# Patient Record
Sex: Female | Born: 1965 | Race: White | Hispanic: No | Marital: Single | Smoking: Never smoker
Health system: Southern US, Community
[De-identification: ages and names within clinical notes are randomized; demographics above are authoritative.]

## PROBLEM LIST (undated history)

## (undated) DIAGNOSIS — K219 Gastro-esophageal reflux disease without esophagitis: Secondary | ICD-10-CM

## (undated) DIAGNOSIS — Z8719 Personal history of other diseases of the digestive system: Secondary | ICD-10-CM

## (undated) DIAGNOSIS — E785 Hyperlipidemia, unspecified: Secondary | ICD-10-CM

## (undated) DIAGNOSIS — M810 Age-related osteoporosis without current pathological fracture: Secondary | ICD-10-CM

## (undated) DIAGNOSIS — M5137 Other intervertebral disc degeneration, lumbosacral region: Secondary | ICD-10-CM

## (undated) DIAGNOSIS — E559 Vitamin D deficiency, unspecified: Secondary | ICD-10-CM

## (undated) DIAGNOSIS — D759 Disease of blood and blood-forming organs, unspecified: Secondary | ICD-10-CM

## (undated) HISTORY — DX: Gastro-esophageal reflux disease without esophagitis: K21.9

## (undated) HISTORY — DX: Vitamin D deficiency, unspecified: E55.9

## (undated) HISTORY — DX: Age-related osteoporosis without current pathological fracture: M81.0

## (undated) HISTORY — DX: Other intervertebral disc degeneration, lumbosacral region: M51.37

## (undated) HISTORY — DX: Hyperlipidemia, unspecified: E78.5

---

## 2003-06-04 ENCOUNTER — Emergency Department (HOSPITAL_COMMUNITY): Admission: EM | Admit: 2003-06-04 | Discharge: 2003-06-04 | Payer: Self-pay | Admitting: Emergency Medicine

## 2006-10-15 ENCOUNTER — Ambulatory Visit (HOSPITAL_COMMUNITY): Admission: RE | Admit: 2006-10-15 | Discharge: 2006-10-15 | Payer: Self-pay | Admitting: Family Medicine

## 2011-07-10 ENCOUNTER — Encounter (HOSPITAL_COMMUNITY): Payer: Self-pay | Admitting: Pharmacy Technician

## 2011-07-23 ENCOUNTER — Encounter (HOSPITAL_COMMUNITY)
Admission: RE | Admit: 2011-07-23 | Discharge: 2011-07-23 | Disposition: A | Discharge status: Home / Self Care | Payer: Medicare Other | Source: Ambulatory Visit | Attending: Oral Surgery | Admitting: Oral Surgery

## 2011-07-23 ENCOUNTER — Encounter (HOSPITAL_COMMUNITY): Payer: Self-pay

## 2011-07-23 HISTORY — DX: Disease of blood and blood-forming organs, unspecified: D75.9

## 2011-07-23 LAB — CBC
HCT: 37.3 % (ref 36.0–46.0)
MCV: 89.9 fL (ref 78.0–100.0)
RBC: 4.15 MIL/uL (ref 3.87–5.11)
RDW: 13.5 % (ref 11.5–15.5)
WBC: 12.3 10*3/uL — ABNORMAL HIGH (ref 4.0–10.5)

## 2011-07-23 LAB — PROTIME-INR: INR: 1 (ref 0.00–1.49)

## 2011-07-23 LAB — APTT: aPTT: 23 seconds — ABNORMAL LOW (ref 24–37)

## 2011-07-23 NOTE — H&P (Signed)
HISTORY AND PHYSICAL  Shannon Castaneda is a 46 y.o. female patient with CC: Painful teeth.  No diagnosis found.  Past Medical History  Diagnosis Date  . Blood dyscrasia     "free bleeder "    No current facility-administered medications for this encounter.   Current Outpatient Prescriptions  Medication Sig Dispense Refill  . beta carotene w/minerals (OCUVITE) tablet Take 1 tablet by mouth daily.      . Calcium Carbonate-Vitamin D (CALCIUM + D PO) Take 1 tablet by mouth daily.      Marland Kitchen ibandronate (BONIVA) 150 MG tablet Take 150 mg by mouth every 30 (thirty) days. Take in the morning with a full glass of water, on an empty stomach, and do not take anything else by mouth or lie down for the next 30 min.      Marland Kitchen PRESCRIPTION MEDICATION Take 1 tablet by mouth daily. Birth control       No Known Allergies Active Problems:  * No active hospital problems. *   Vitals: There were no vitals taken for this visit. Lab results: Results for orders placed during the hospital encounter of 07/23/11 (from the past 24 hour(s))  CBC     Status: Abnormal   Collection Time   07/23/11 12:00 PM      Component Value Range   WBC 12.3 (*) 4.0 - 10.5 (K/uL)   RBC 4.15  3.87 - 5.11 (MIL/uL)   Hemoglobin 13.1  12.0 - 15.0 (g/dL)   HCT 16.1  09.6 - 04.5 (%)   MCV 89.9  78.0 - 100.0 (fL)   MCH 31.6  26.0 - 34.0 (pg)   MCHC 35.1  30.0 - 36.0 (g/dL)   RDW 40.9  81.1 - 91.4 (%)   Platelets 245  150 - 400 (K/uL)  HCG, SERUM, QUALITATIVE     Status: Normal   Collection Time   07/23/11 12:00 PM      Component Value Range   Preg, Serum NEGATIVE  NEGATIVE   PROTIME-INR     Status: Normal   Collection Time   07/23/11 12:00 PM      Component Value Range   Prothrombin Time 13.4  11.6 - 15.2 (seconds)   INR 1.00  0.00 - 1.49   APTT     Status: Abnormal   Collection Time   07/23/11 12:00 PM      Component Value Range   aPTT 23 (*) 24 - 37 (seconds)   Radiology Results: No results found. General appearance: alert,  cooperative and no distress Head: Normocephalic, without obvious abnormality, atraumatic Eyes: conjunctivae/corneas clear. PERRL, EOM's intact. Fundi benign. Ears: normal TM's and external ear canals both ears Nose: Nares normal. Septum midline. Mucosa normal. No drainage or sinus tenderness. Throat: severe dental caris teeth #s 1, 2, 3, 5, 6, 11, 12, 14, 15, 16 Neck: no adenopathy, no carotid bruit, no JVD, supple, symmetrical, trachea midline and thyroid not enlarged, symmetric, no tenderness/mass/nodules Resp: clear to auscultation bilaterally Cardio: regular rate and rhythm, S1, S2 normal, no murmur, click, rub or gallop  Assessment:45 WF with gross dental decayteeth #s 1, 2, 3, 5, 6, 11, 12, 14, 15, 16   Plan: Extract teeth #'s  1, 2, 3, 5, 6, 11, 12, 14, 15, 16, alveoloplasty, day surgery. Insert immediate denture  Jacarie Pate M 07/23/2011

## 2011-07-23 NOTE — Pre-Procedure Instructions (Signed)
20 Shannon Castaneda  07/23/2011   Your procedure is scheduled on:    Monday  07/27/11  Report to [facility] at 530 AM.  Call this number if you have problems the morning of surgery: [phone]   Remember:   Do not eat food:After Midnight.  May have clear liquids: up to 4 Hours before arrival.  Clear liquids include soda, tea, black coffee, apple or grape juice, broth.  Take these medicines the morning of surgery with A SIP OF WATER:   Birth control    Do not wear jewelry, make-up or nail polish.  Do not wear lotions, powders, or perfumes. You may wear deodorant.  Do not shave 48 hours prior to surgery.  Do not bring valuables to the hospital.  Contacts, dentures or bridgework may not be worn into surgery.  Leave suitcase in the car. After surgery it may be brought to your room.  For patients admitted to the hospital, checkout time is 11:00 AM the day of discharge.   Patients discharged the day of surgery will not be allowed to drive home.  Name and phone number of your driver:   Special Instructions: CHG Shower Use Special Wash: 1/2 bottle night before surgery and 1/2 bottle morning of surgery.   Please read over the following fact sheets that you were given: Pain Booklet, MRSA Information and Surgical Site Infection Prevention

## 2011-07-27 ENCOUNTER — Encounter (HOSPITAL_COMMUNITY): Payer: Self-pay | Admitting: Anesthesiology

## 2011-07-27 ENCOUNTER — Ambulatory Visit (HOSPITAL_COMMUNITY): Payer: Medicare Other | Admitting: Anesthesiology

## 2011-07-27 ENCOUNTER — Encounter (HOSPITAL_COMMUNITY): Payer: Self-pay | Admitting: Surgery

## 2011-07-27 ENCOUNTER — Encounter (HOSPITAL_COMMUNITY)
Admission: RE | Disposition: A | Discharge status: Home / Self Care | Payer: Self-pay | Source: Ambulatory Visit | Attending: Oral Surgery

## 2011-07-27 ENCOUNTER — Ambulatory Visit (HOSPITAL_COMMUNITY)
Admission: RE | Admit: 2011-07-27 | Discharge: 2011-07-27 | Disposition: A | Discharge status: Home / Self Care | Payer: Medicare Other | Source: Ambulatory Visit | Attending: Oral Surgery | Admitting: Oral Surgery

## 2011-07-27 DIAGNOSIS — K089 Disorder of teeth and supporting structures, unspecified: Secondary | ICD-10-CM | POA: Insufficient documentation

## 2011-07-27 DIAGNOSIS — K05329 Chronic periodontitis, generalized, unspecified severity: Secondary | ICD-10-CM

## 2011-07-27 DIAGNOSIS — K029 Dental caries, unspecified: Secondary | ICD-10-CM | POA: Insufficient documentation

## 2011-07-27 DIAGNOSIS — Z01812 Encounter for preprocedural laboratory examination: Secondary | ICD-10-CM | POA: Insufficient documentation

## 2011-07-27 DIAGNOSIS — D759 Disease of blood and blood-forming organs, unspecified: Secondary | ICD-10-CM | POA: Insufficient documentation

## 2011-07-27 HISTORY — PX: MULTIPLE EXTRACTIONS WITH ALVEOLOPLASTY: SHX5342

## 2011-07-27 SURGERY — MULTIPLE EXTRACTION WITH ALVEOLOPLASTY
Anesthesia: General | Site: Mouth | Laterality: Bilateral | Wound class: Clean Contaminated

## 2011-07-27 MED ORDER — SODIUM CHLORIDE 0.9 % IR SOLN
Status: DC | PRN
  Administered 2011-07-27: 1000 mL

## 2011-07-27 MED ORDER — PROPOFOL 10 MG/ML IV EMUL
INTRAVENOUS | Status: DC | PRN
  Administered 2011-07-27: 150 mg via INTRAVENOUS

## 2011-07-27 MED ORDER — ONDANSETRON HCL 4 MG/2ML IJ SOLN
INTRAMUSCULAR | Status: DC | PRN
  Administered 2011-07-27: 4 mg via INTRAVENOUS

## 2011-07-27 MED ORDER — OXYCODONE-ACETAMINOPHEN 5-325 MG PO TABS: 1.0000 | ORAL_TABLET | ORAL | Status: AC | PRN

## 2011-07-27 MED ORDER — LACTATED RINGERS IV SOLN
INTRAVENOUS | Status: DC | PRN
  Administered 2011-07-27 (×2): via INTRAVENOUS

## 2011-07-27 MED ORDER — PROMETHAZINE HCL 25 MG/ML IJ SOLN: 6.2500 mg | INTRAMUSCULAR | Status: DC | PRN

## 2011-07-27 MED ORDER — LIDOCAINE-EPINEPHRINE 2 %-1:100000 IJ SOLN
INTRAMUSCULAR | Status: DC | PRN
  Administered 2011-07-27: 14 mL

## 2011-07-27 MED ORDER — MORPHINE SULFATE 2 MG/ML IJ SOLN
1.0000 mg | INTRAMUSCULAR | Status: DC | PRN
  Administered 2011-07-27 (×2): 1 mg via INTRAVENOUS

## 2011-07-27 MED ORDER — FENTANYL CITRATE 0.05 MG/ML IJ SOLN
INTRAMUSCULAR | Status: DC | PRN
  Administered 2011-07-27: 125 ug via INTRAVENOUS

## 2011-07-27 MED ORDER — MEPERIDINE HCL 25 MG/ML IJ SOLN: 6.2500 mg | INTRAMUSCULAR | Status: DC | PRN

## 2011-07-27 MED ORDER — SUCCINYLCHOLINE CHLORIDE 20 MG/ML IJ SOLN
INTRAMUSCULAR | Status: DC | PRN
  Administered 2011-07-27: 80 mg via INTRAVENOUS

## 2011-07-27 MED ORDER — OXYMETAZOLINE HCL 0.05 % NA SOLN
NASAL | Status: DC | PRN
  Administered 2011-07-27: 2 via NASAL

## 2011-07-27 MED ORDER — CEFAZOLIN SODIUM 1-5 GM-% IV SOLN
INTRAVENOUS | Status: AC
  Filled 2011-07-27: qty 50

## 2011-07-27 MED ORDER — MIDAZOLAM HCL 5 MG/5ML IJ SOLN
INTRAMUSCULAR | Status: DC | PRN
  Administered 2011-07-27: 2 mg via INTRAVENOUS

## 2011-07-27 MED ORDER — LIDOCAINE HCL (CARDIAC) 20 MG/ML IV SOLN
INTRAVENOUS | Status: DC | PRN
  Administered 2011-07-27: 70 mg via INTRAVENOUS

## 2011-07-27 MED ORDER — CEFAZOLIN SODIUM 1-5 GM-% IV SOLN
INTRAVENOUS | Status: DC | PRN
  Administered 2011-07-27: 1 g via INTRAVENOUS

## 2011-07-27 SURGICAL SUPPLY — 32 items
BLADE SURG 15 STRL LF DISP TIS (BLADE) ×1 IMPLANT
BLADE SURG 15 STRL SS (BLADE) ×1
BUR CROSS CUT FISSURE 1.6 (BURR) ×2 IMPLANT
BUR EGG ELITE 4.0 (BURR) ×2 IMPLANT
CANISTER SUCTION 2500CC (MISCELLANEOUS) ×2 IMPLANT
CLOTH BEACON ORANGE TIMEOUT ST (SAFETY) ×2 IMPLANT
COVER SURGICAL LIGHT HANDLE (MISCELLANEOUS) ×2 IMPLANT
CRADLE DONUT ADULT HEAD (MISCELLANEOUS) ×2 IMPLANT
DECANTER SPIKE VIAL GLASS SM (MISCELLANEOUS) ×2 IMPLANT
GAUZE PACKING FOLDED 2  STR (GAUZE/BANDAGES/DRESSINGS) ×1
GAUZE PACKING FOLDED 2 STR (GAUZE/BANDAGES/DRESSINGS) ×1 IMPLANT
GLOVE BIO SURGEON STRL SZ 6.5 (GLOVE) ×4 IMPLANT
GLOVE BIO SURGEON STRL SZ7 (GLOVE) IMPLANT
GLOVE BIO SURGEON STRL SZ7.5 (GLOVE) ×2 IMPLANT
GLOVE BIOGEL PI IND STRL 7.0 (GLOVE) ×2 IMPLANT
GLOVE BIOGEL PI INDICATOR 7.0 (GLOVE) ×2
GLOVE SURG SS PI 6.5 STRL IVOR (GLOVE) ×2 IMPLANT
GOWN STRL NON-REIN LRG LVL3 (GOWN DISPOSABLE) ×6 IMPLANT
GOWN STRL REIN XL XLG (GOWN DISPOSABLE) ×2 IMPLANT
KIT BASIN OR (CUSTOM PROCEDURE TRAY) ×2 IMPLANT
KIT ROOM TURNOVER OR (KITS) ×2 IMPLANT
NEEDLE 22X1 1/2 (OR ONLY) (NEEDLE) ×2 IMPLANT
NS IRRIG 1000ML POUR BTL (IV SOLUTION) ×2 IMPLANT
PAD ARMBOARD 7.5X6 YLW CONV (MISCELLANEOUS) ×4 IMPLANT
SUT CHROMIC 3 0 PS 2 (SUTURE) ×2 IMPLANT
SYR 50ML SLIP (SYRINGE) IMPLANT
TOWEL OR 17X24 6PK STRL BLUE (TOWEL DISPOSABLE) ×2 IMPLANT
TOWEL OR 17X26 10 PK STRL BLUE (TOWEL DISPOSABLE) ×2 IMPLANT
TRAY ENT MC OR (CUSTOM PROCEDURE TRAY) ×2 IMPLANT
TUBING IRRIGATION (MISCELLANEOUS) ×2 IMPLANT
WATER STERILE IRR 1000ML POUR (IV SOLUTION) IMPLANT
YANKAUER SUCT BULB TIP NO VENT (SUCTIONS) ×2 IMPLANT

## 2011-07-27 NOTE — Anesthesia Postprocedure Evaluation (Signed)
  Anesthesia Post-op Note  Patient: Shannon Castaneda  Procedure(s) Performed: Procedure(s) (LRB): MULTIPLE EXTRACION WITH ALVEOLOPLASTY (Bilateral)  Patient Location: PACU  Anesthesia Type: General  Level of Consciousness: awake  Airway and Oxygen Therapy: Patient Spontanous Breathing  Post-op Pain: mild  Post-op Assessment: Post-op Vital signs reviewed  Post-op Vital Signs: stable  Complications: No apparent anesthesia complications

## 2011-07-27 NOTE — Transfer of Care (Signed)
Immediate Anesthesia Transfer of Care Note  Patient: Shannon Castaneda  Procedure(s) Performed: Procedure(s) (LRB): MULTIPLE EXTRACION WITH ALVEOLOPLASTY (Bilateral)  Patient Location: PACU  Anesthesia Type: General  Level of Consciousness: awake, alert , oriented and patient cooperative  Airway & Oxygen Therapy: Patient Spontanous Breathing and Patient connected to nasal cannula oxygen  Post-op Assessment: Report given to PACU RN  Post vital signs: Reviewed and stable  Complications: No apparent anesthesia complications

## 2011-07-27 NOTE — Anesthesia Procedure Notes (Signed)
Procedure Name: Intubation Date/Time: 07/27/2011 8:34 AM Performed by: Rogelia Boga Pre-anesthesia Checklist: Patient identified, Suction available, Patient being monitored and Timeout performed Patient Re-evaluated:Patient Re-evaluated prior to inductionOxygen Delivery Method: Circle system utilized Preoxygenation: Pre-oxygenation with 100% oxygen Intubation Type: IV induction Ventilation: Mask ventilation without difficulty Nasal Tubes: Right, Nasal prep performed and Nasal Rae Tube size: 6.5 mm Number of attempts: 1 Airway Equipment and Method: Video-laryngoscopy Placement Confirmation: ETT inserted through vocal cords under direct vision,  positive ETCO2 and breath sounds checked- equal and bilateral Tube secured with: Tape Dental Injury: Teeth and Oropharynx as per pre-operative assessment

## 2011-07-27 NOTE — Op Note (Signed)
07/27/2011  9:12 AM  PATIENT:  Shannon Castaneda  46 y.o. female  PRE-OPERATIVE DIAGNOSIS:  NONRESTORABLE TEETH #'s 1, 2, 3, 5, 6, 11, 12, 14, 15, 16,   POST-OPERATIVE DIAGNOSIS:  SAME  PROCEDURE:  Procedure(s): MULTIPLE EXTRACTION NONRESTORABLE TEETH #'s 1, 2, 3, 5, 6, 11, 12, 14, 15, 16,  WITH ALVEOLOPLASTY, Insertion Immediate Denture  SURGEON:  Surgeon(s): Georgia Lopes, DDS  ANESTHESIA:   local and general  EBL:  minimal  DRAINS: none   LOCAL MEDICATIONS USED:  LIDOCAINE 14 CC  SPECIMEN:  No Specimen  COUNTS:  YES  PLAN OF CARE: Discharge to home after PACU  PATIENT DISPOSITION:  PACU - hemodynamically stable.   PROCEDURE DETAILS: Dictation # [id number]  Georgia Lopes, DMD 07/27/2011 9:12 AM

## 2011-07-27 NOTE — H&P (Signed)
H&P documentation  -History and Physical Reviewed  -Patient has been re-examined  -No change in the plan of care  Shannon Castaneda M  

## 2011-07-27 NOTE — Preoperative (Signed)
Beta Blockers   Reason not to administer Beta Blockers:Not Applicable 

## 2011-07-27 NOTE — Anesthesia Preprocedure Evaluation (Addendum)
Anesthesia Evaluation  Patient identified by MRN, date of birth, ID band Patient awake    Reviewed: Allergy & Precautions, H&P , NPO status   Airway Mallampati: II TM Distance: <3 FB Neck ROM: Full  Mouth opening: Limited Mouth Opening  Dental  (+) Poor Dentition and Dental Advisory Given   Pulmonary  breath sounds clear to auscultation        Cardiovascular Rhythm:Regular Rate:Normal     Neuro/Psych    GI/Hepatic   Endo/Other    Renal/GU      Musculoskeletal   Abdominal   Peds  Hematology   Anesthesia Other Findings   Reproductive/Obstetrics                          Anesthesia Physical Anesthesia Plan  ASA: II  Anesthesia Plan: General   Post-op Pain Management:    Induction: Intravenous  Airway Management Planned: Oral ETT  Additional Equipment:   Intra-op Plan:   Post-operative Plan: Extubation in OR  Informed Consent: I have reviewed the patients History and Physical, chart, labs and discussed the procedure including the risks, benefits and alternatives for the proposed anesthesia with the patient or authorized representative who has indicated his/her understanding and acceptance.   Dental advisory given  Plan Discussed with: CRNA, Surgeon and Anesthesiologist  Anesthesia Plan Comments:        Anesthesia Quick Evaluation

## 2011-07-28 NOTE — Op Note (Signed)
NAMECHASITTY, HEHL               ACCOUNT NO.:  [id number]  MEDICAL RECORD NO.:  [id number]  LOCATION:  [facility]                         FACILITY:  [facility]  PHYSICIAN:  Georgia Lopes, M.D.  DATE OF BIRTH:  1965/06/22  DATE OF PROCEDURE:  07/27/2011 DATE OF DISCHARGE:  07/27/2011                              OPERATIVE REPORT   PREOPERATIVE DIAGNOSIS:  Nonrestorable teeth numbers 1, 2, 3, 5, 6, 11, 12, 14, 15, 16.  POSTOPERATIVE DIAGNOSIS:  Nonrestorable teeth numbers 1, 2, 3, 5, 6, 11, 12, 14, 15, 16.  PROCEDURE:  Removal of teeth numbers 1, 2, 3, 5, 6, 11, 12, 14, 15, 16; alveoplasty right and left maxilla; insertion of immediate denture.  SURGEON:  Georgia Lopes, M.D.  ANESTHESIA:  General, nasal intubation.  INDICATIONS FOR PROCEDURE:  Shannon Castaneda is a 46 year old female, who is referred to my office by her general dentist for removal of multiple nonrestorable teeth.  She has a benign past medical history, but because of the number of teeth to be removed, it was recommended that the patient undergo general anesthesia for comfort and ease of surgery.  PROCEDURE:  The patient was taken to the operating room and placed on the table in supine position.  General anesthesia was administered intravenously and a nasal endotracheal tube was placed and secured.  The eyes were protected.  The patient was draped for the procedure.  Time- out was performed.  The posterior pharynx was suctioned.  A throat pack was placed.  A 2% lidocaine with 1:100,000 epinephrine was infiltrated in the right and left maxilla around the teeth to be removed, a total of 14 mL was utilized.  The left side was operated first.  A #15 blade was used to make a full-thickness incision buccally and palatally around teeth numbers 11, 12, 14, 15, 16.  The periosteum was reflected. Interproximal bone was removed with the Stryker handpiece under irrigation.  The teeth were elevated with a #301 elevator and removed from  the mouth with the upper universal forceps.  The sockets were then curetted and alveoplasty was performed with the egg-shaped bur and then the area was irrigated and closed with 3-0 chromic.  The right side was operated next.  A #15 blade was used to make a full-thickness incision around teeth numbers 1, 2, 3, 5, 6.  The periosteum was reflected with a periosteal elevator.  Interproximal bone was removed with a Stryker handpiece and then the teeth were elevated with a #301 elevator and removed from the mouth with a #150 forceps.  Sockets were then curetted and irrigated.  Alveoplasty was performed with the egg-shaped bur and then closure was performed with 3-0 chromic.  Then the upper previously fabricated clear acrylic denture was checked into the mouth and found to have good fit and was aligned with the soft denture lining material and placed in the mouth and allowed to set.  Then the denture material was trimmed and the surgery was concluded.  The oral cavity was irrigated, suctioned.  Throat pack was removed.  The patient was taken to the recovery room, breathing spontaneously, in good condition.  ESTIMATED BLOOD LOSS:  Minimum.  COMPLICATIONS:  None.  SPECIMENS:  None.     Georgia Lopes, M.D.     SMJ/MEDQ  D:  07/27/2011  T:  07/28/2011  Job:  [id number]

## 2011-08-03 ENCOUNTER — Encounter (HOSPITAL_COMMUNITY): Payer: Self-pay | Admitting: Oral Surgery

## 2012-09-13 ENCOUNTER — Other Ambulatory Visit (HOSPITAL_COMMUNITY): Payer: Self-pay | Admitting: Family Medicine

## 2012-09-13 ENCOUNTER — Ambulatory Visit (HOSPITAL_COMMUNITY)
Admission: RE | Admit: 2012-09-13 | Discharge: 2012-09-13 | Disposition: A | Discharge status: Home / Self Care | Payer: Medicare Other | Source: Ambulatory Visit | Attending: Family Medicine | Admitting: Family Medicine

## 2012-09-13 DIAGNOSIS — M47817 Spondylosis without myelopathy or radiculopathy, lumbosacral region: Secondary | ICD-10-CM | POA: Insufficient documentation

## 2012-09-13 DIAGNOSIS — M810 Age-related osteoporosis without current pathological fracture: Secondary | ICD-10-CM

## 2012-09-13 DIAGNOSIS — M47814 Spondylosis without myelopathy or radiculopathy, thoracic region: Secondary | ICD-10-CM | POA: Insufficient documentation

## 2012-09-13 DIAGNOSIS — IMO0002 Reserved for concepts with insufficient information to code with codable children: Secondary | ICD-10-CM

## 2012-09-13 DIAGNOSIS — M549 Dorsalgia, unspecified: Secondary | ICD-10-CM | POA: Insufficient documentation

## 2012-09-19 ENCOUNTER — Other Ambulatory Visit (HOSPITAL_COMMUNITY): Payer: Medicare Other

## 2015-03-18 ENCOUNTER — Ambulatory Visit (HOSPITAL_COMMUNITY)
Admission: RE | Admit: 2015-03-18 | Discharge: 2015-03-18 | Disposition: A | Discharge status: Home / Self Care | Payer: Medicare Other | Source: Ambulatory Visit | Attending: Family Medicine | Admitting: Family Medicine

## 2015-03-18 ENCOUNTER — Other Ambulatory Visit (HOSPITAL_COMMUNITY): Payer: Self-pay | Admitting: Family Medicine

## 2015-03-18 DIAGNOSIS — M5136 Other intervertebral disc degeneration, lumbar region: Secondary | ICD-10-CM | POA: Diagnosis not present

## 2015-03-18 DIAGNOSIS — M5442 Lumbago with sciatica, left side: Secondary | ICD-10-CM

## 2015-03-18 DIAGNOSIS — M545 Low back pain: Secondary | ICD-10-CM | POA: Diagnosis present

## 2015-03-18 DIAGNOSIS — M5137 Other intervertebral disc degeneration, lumbosacral region: Secondary | ICD-10-CM | POA: Diagnosis not present

## 2015-04-01 ENCOUNTER — Other Ambulatory Visit (HOSPITAL_COMMUNITY): Payer: Medicare Other

## 2015-12-09 ENCOUNTER — Other Ambulatory Visit (HOSPITAL_COMMUNITY): Payer: Self-pay | Admitting: Family Medicine

## 2015-12-09 DIAGNOSIS — R29898 Other symptoms and signs involving the musculoskeletal system: Secondary | ICD-10-CM

## 2015-12-09 DIAGNOSIS — M79605 Pain in left leg: Principal | ICD-10-CM

## 2015-12-09 DIAGNOSIS — M545 Low back pain: Secondary | ICD-10-CM

## 2015-12-19 ENCOUNTER — Ambulatory Visit (HOSPITAL_COMMUNITY)
Admission: RE | Admit: 2015-12-19 | Discharge: 2015-12-19 | Disposition: A | Discharge status: Home / Self Care | Payer: Medicare Other | Source: Ambulatory Visit | Attending: Family Medicine | Admitting: Family Medicine

## 2015-12-19 DIAGNOSIS — M5136 Other intervertebral disc degeneration, lumbar region: Secondary | ICD-10-CM | POA: Diagnosis not present

## 2015-12-19 DIAGNOSIS — M545 Low back pain, unspecified: Secondary | ICD-10-CM

## 2015-12-19 DIAGNOSIS — R531 Weakness: Secondary | ICD-10-CM | POA: Insufficient documentation

## 2015-12-19 DIAGNOSIS — R29898 Other symptoms and signs involving the musculoskeletal system: Secondary | ICD-10-CM

## 2015-12-19 DIAGNOSIS — M79605 Pain in left leg: Secondary | ICD-10-CM

## 2015-12-19 DIAGNOSIS — M5127 Other intervertebral disc displacement, lumbosacral region: Secondary | ICD-10-CM | POA: Insufficient documentation

## 2016-03-06 ENCOUNTER — Telehealth: Payer: Self-pay | Admitting: Family Medicine

## 2016-03-06 NOTE — Telephone Encounter (Signed)
Left message for pt to return call to sch appt with dr Delton Seenelson.

## 2016-03-17 ENCOUNTER — Other Ambulatory Visit: Payer: Self-pay | Admitting: Family Medicine

## 2016-03-17 ENCOUNTER — Encounter (INDEPENDENT_AMBULATORY_CARE_PROVIDER_SITE_OTHER): Payer: Self-pay

## 2016-03-17 ENCOUNTER — Ambulatory Visit (INDEPENDENT_AMBULATORY_CARE_PROVIDER_SITE_OTHER): Payer: Medicare Other | Admitting: Family Medicine

## 2016-03-17 ENCOUNTER — Encounter: Payer: Self-pay | Admitting: Family Medicine

## 2016-03-17 VITALS — BP 116/72 | HR 72 | Temp 97.8°F | Resp 18 | Ht 65.25 in | Wt 110.0 lb

## 2016-03-17 DIAGNOSIS — M5137 Other intervertebral disc degeneration, lumbosacral region: Secondary | ICD-10-CM | POA: Diagnosis not present

## 2016-03-17 DIAGNOSIS — E785 Hyperlipidemia, unspecified: Secondary | ICD-10-CM | POA: Insufficient documentation

## 2016-03-17 DIAGNOSIS — H50012 Monocular esotropia, left eye: Secondary | ICD-10-CM

## 2016-03-17 DIAGNOSIS — F79 Unspecified intellectual disabilities: Secondary | ICD-10-CM

## 2016-03-17 DIAGNOSIS — M549 Dorsalgia, unspecified: Secondary | ICD-10-CM

## 2016-03-17 DIAGNOSIS — Z7689 Persons encountering health services in other specified circumstances: Secondary | ICD-10-CM | POA: Diagnosis not present

## 2016-03-17 DIAGNOSIS — M545 Low back pain: Secondary | ICD-10-CM

## 2016-03-17 DIAGNOSIS — M810 Age-related osteoporosis without current pathological fracture: Secondary | ICD-10-CM

## 2016-03-17 DIAGNOSIS — H5000 Unspecified esotropia: Secondary | ICD-10-CM

## 2016-03-17 DIAGNOSIS — M8580 Other specified disorders of bone density and structure, unspecified site: Secondary | ICD-10-CM | POA: Insufficient documentation

## 2016-03-17 DIAGNOSIS — G8929 Other chronic pain: Secondary | ICD-10-CM

## 2016-03-17 DIAGNOSIS — M51379 Other intervertebral disc degeneration, lumbosacral region without mention of lumbar back pain or lower extremity pain: Secondary | ICD-10-CM | POA: Insufficient documentation

## 2016-03-17 HISTORY — DX: Other intervertebral disc degeneration, lumbosacral region without mention of lumbar back pain or lower extremity pain: M51.379

## 2016-03-17 HISTORY — DX: Other intervertebral disc degeneration, lumbosacral region: M51.37

## 2016-03-17 MED ORDER — MELOXICAM 15 MG PO TABS: 15.0000 mg | ORAL_TABLET | Freq: Every day | ORAL | 0 refills | Status: DC

## 2016-03-17 NOTE — Patient Instructions (Signed)
Need old records  Need to come back for a complete physical next time  I am starting meloxicam for your back Take once a day with food Let me know if this upsets your stomach

## 2016-03-17 NOTE — Progress Notes (Signed)
Chief Complaint  Patient presents with  . Establish Care   50 year old woman here as a new patient to establish with the office. She described herself as a "slow learner" states she is disabled for mental insufficiency. She is here by herself. She is a poor historian. She's has no acute problems today. She does have chronic recurring low back pain. An MRI of her low back showed multilevel degenerative disc disease. She is on Fosamax once a week for her bones. She does not think she has osteoporosis. She tells me she has "scoliosis". This is not supported by her x-rays. She takes pravastatin for high cholesterol. She is not on a cholesterol diet. She does take calcium and vitamin D supplementation for her bones. She hasn't had a Pap smear since she had her child (17 years). She doesn't number she's ever had a mammogram. She certain she's never had a colonoscopy. She got a flu shot at the drugstore. Doesn't know her last Td.    Patient Active Problem List   Diagnosis Date Noted  . Degeneration of lumbar or lumbosacral intervertebral disc 03/17/2016  . Chronic back pain 03/17/2016  . Mental impairment 03/17/2016  . Osteoporosis 03/17/2016  . HLD (hyperlipidemia) 03/17/2016  . Esotropia, left eye 03/17/2016    Outpatient Encounter Prescriptions as of 03/17/2016  Medication Sig  . alendronate (FOSAMAX) 35 MG tablet Take 35 mg by mouth every 7 (seven) days. Take with a full glass of water on an empty stomach.  . Calcium Carbonate-Vitamin D (CALCIUM + D PO) Take 1 tablet by mouth daily.  . pravastatin (PRAVACHOL) 40 MG tablet Take 40 mg by mouth daily.  . beta carotene w/minerals (OCUVITE) tablet Take 1 tablet by mouth daily.  . meloxicam (MOBIC) 15 MG tablet Take 1 tablet (15 mg total) by mouth daily.   No facility-administered encounter medications on file as of 03/17/2016.     Past Medical History:  Diagnosis Date  . Blood dyscrasia    "free bleeder "  . Degeneration of lumbar  or lumbosacral intervertebral disc 03/17/2016  . Hyperlipidemia   . Osteoporosis     Past Surgical History:  Procedure Laterality Date  . MULTIPLE EXTRACTIONS WITH ALVEOLOPLASTY  07/27/2011   Procedure: MULTIPLE EXTRACION WITH ALVEOLOPLASTY;  Surgeon: Georgia LopesScott M Jensen, DDS;  Location: [facility] OR;  Service: Oral Surgery;  Laterality: Bilateral;  Insertion of denture.    Social History   Social History  . Marital status: Single    Spouse name: N/A  . Number of children: 1  . Years of education: 812   Occupational History  .      disability   Social History Main Topics  . Smoking status: Never Smoker  . Smokeless tobacco: Never Used  . Alcohol use No  . Drug use: No  . Sexual activity: Yes    Birth control/ protection: Pill   Other Topics Concern  . Not on file   Social History Narrative   Lives with daughter age 52, granddaughter   Disabled due to "slow learner"    Family History  Problem Relation Age of Onset  . Heart disease Mother   . Cancer Brother     lung    Review of Systems  Constitutional: Negative for chills, fever and weight loss.  HENT: Negative for congestion and hearing loss.   Eyes: Negative for blurred vision and pain.  Respiratory: Negative for cough and shortness of breath.   Cardiovascular: Negative for chest pain and leg swelling.  Gastrointestinal: Negative for abdominal pain, constipation, diarrhea and heartburn.  Genitourinary: Negative for dysuria and frequency.  Musculoskeletal: Positive for back pain. Negative for falls, joint pain and myalgias.  Neurological: Negative for dizziness, seizures and headaches.  Psychiatric/Behavioral: Negative for depression. The patient is not nervous/anxious and does not have insomnia.     BP 116/72 (BP Location: Right Arm, Patient Position: Sitting, Cuff Size: Normal)   Pulse 72   Temp 97.8 F (36.6 C) (Oral)   Resp 18   Ht 5' 5.25" (1.657 m)   Wt 110 lb 0.6 oz (49.9 kg)   LMP 10/27/2010   SpO2 100%    BMI 18.17 kg/m   Physical Exam  Constitutional: She is oriented to person, place, and time. She appears well-developed and well-nourished.  Thin in appearance  HENT:  Head: Normocephalic and atraumatic.  Right Ear: External ear normal.  Left Ear: External ear normal.  Mouth/Throat: Oropharynx is clear and moist.  Eyes: Conjunctivae are normal. Pupils are equal, round, and reactive to light.   esotropia left  Neck: Normal range of motion. Neck supple. No thyromegaly present.  Cardiovascular: Normal rate, regular rhythm and normal heart sounds.   Pulmonary/Chest: Effort normal and breath sounds normal. No respiratory distress.  Abdominal: Soft. Bowel sounds are normal.  Musculoskeletal: Normal range of motion. She exhibits no edema.  Lymphadenopathy:    She has no cervical adenopathy.  Neurological: She is alert and oriented to person, place, and time.  Gait normal  Skin: Skin is warm and dry.  Psychiatric: She has a normal mood and affect. Her behavior is normal. Thought content normal.  Poor fund of knowledge.  Nursing note and vitals reviewed.  ASSESSMENT/PLAN:  1. Degeneration of lumbar or lumbosacral intervertebral disc Documented with MRI  2. Chronic bilateral low back pain, with sciatica presence unspecified Prescribed Mobic one a day. Walking and stretching recommended  3. Mental impairment By patient history  4. Osteoporosis without current pathological fracture, unspecified osteoporosis type By history, we'll repeat DEXA scan  5. Encounter to establish care with new doctor    6. Hyperlipidemia, unspecified hyperlipidemia type By history, on pravastatin  7. Esotropia, left eye     Patient Instructions  Need old records  Need to come back for a complete physical next time  I am starting meloxicam for your back Take once a day with food Let me know if this upsets your stomach     Eustace MooreYvonne Sue Nelson, MD

## 2016-04-13 ENCOUNTER — Other Ambulatory Visit: Payer: Self-pay | Admitting: Family Medicine

## 2016-04-14 ENCOUNTER — Encounter: Payer: Medicare Other | Admitting: Family Medicine

## 2016-04-15 ENCOUNTER — Other Ambulatory Visit: Payer: Self-pay | Admitting: Family Medicine

## 2016-04-21 ENCOUNTER — Other Ambulatory Visit: Payer: Self-pay | Admitting: Family Medicine

## 2016-04-21 ENCOUNTER — Encounter: Payer: Medicare Other | Admitting: Family Medicine

## 2016-04-21 MED ORDER — MELOXICAM 15 MG PO TABS: 15.0000 mg | ORAL_TABLET | Freq: Every day | ORAL | 2 refills | Status: DC

## 2016-04-27 ENCOUNTER — Encounter: Payer: Self-pay | Admitting: Family Medicine

## 2016-04-27 ENCOUNTER — Encounter: Payer: Medicare Other | Admitting: Family Medicine

## 2016-04-28 ENCOUNTER — Encounter: Payer: Medicare Other | Admitting: Family Medicine

## 2016-05-14 ENCOUNTER — Encounter: Payer: Medicare Other | Admitting: Family Medicine

## 2016-05-19 ENCOUNTER — Ambulatory Visit (INDEPENDENT_AMBULATORY_CARE_PROVIDER_SITE_OTHER): Payer: Medicare Other | Admitting: Family Medicine

## 2016-05-19 ENCOUNTER — Encounter: Payer: Self-pay | Admitting: Family Medicine

## 2016-05-19 VITALS — BP 124/80 | HR 70 | Temp 97.9°F | Resp 18 | Ht 65.25 in | Wt 116.1 lb

## 2016-05-19 DIAGNOSIS — Z1211 Encounter for screening for malignant neoplasm of colon: Secondary | ICD-10-CM | POA: Diagnosis not present

## 2016-05-19 DIAGNOSIS — Z0000 Encounter for general adult medical examination without abnormal findings: Secondary | ICD-10-CM | POA: Diagnosis not present

## 2016-05-19 DIAGNOSIS — E785 Hyperlipidemia, unspecified: Secondary | ICD-10-CM | POA: Diagnosis not present

## 2016-05-19 DIAGNOSIS — M810 Age-related osteoporosis without current pathological fracture: Secondary | ICD-10-CM | POA: Diagnosis not present

## 2016-05-19 DIAGNOSIS — Z1231 Encounter for screening mammogram for malignant neoplasm of breast: Secondary | ICD-10-CM | POA: Diagnosis not present

## 2016-05-19 NOTE — Progress Notes (Signed)
Chief Complaint  Patient presents with  . Annual Exam   Here for annual exam Today declines a PAP/pelvic.  Says she "might" have had this done 2 y ago  Agrees to mammogram and dexa scan Refuses colonoscopy.  Discussed and agrees to cologuard if insurance agrees and a colo IF the cologuard is positive Will get screening labs done today Will check old records for a TdaP    Patient Active Problem List   Diagnosis Date Noted  . Degeneration of lumbar or lumbosacral intervertebral disc 03/17/2016  . Chronic back pain 03/17/2016  . Mental impairment 03/17/2016  . Osteoporosis 03/17/2016  . HLD (hyperlipidemia) 03/17/2016  . Esotropia, left eye 03/17/2016    Outpatient Encounter Prescriptions as of 05/19/2016  Medication Sig  . alendronate (FOSAMAX) 35 MG tablet Take 35 mg by mouth every 7 (seven) days. Take with a full glass of water on an empty stomach.  . beta carotene w/minerals (OCUVITE) tablet Take 1 tablet by mouth daily.  . Calcium Carbonate-Vitamin D (CALCIUM + D PO) Take 1 tablet by mouth daily.  . meloxicam (MOBIC) 15 MG tablet Take 1 tablet (15 mg total) by mouth daily.  . pravastatin (PRAVACHOL) 40 MG tablet Take 40 mg by mouth daily.   No facility-administered encounter medications on file as of 05/19/2016.     Past Medical History:  Diagnosis Date  . Blood dyscrasia    "free bleeder "  . Degeneration of lumbar or lumbosacral intervertebral disc 03/17/2016  . Hyperlipidemia   . Osteoporosis     Past Surgical History:  Procedure Laterality Date  . MULTIPLE EXTRACTIONS WITH ALVEOLOPLASTY  07/27/2011   Procedure: MULTIPLE EXTRACION WITH ALVEOLOPLASTY;  Surgeon: Georgia Lopes, DDS;  Location: [facility] OR;  Service: Oral Surgery;  Laterality: Bilateral;  Insertion of denture.    Social History   Social History  . Marital status: Single    Spouse name: N/A  . Number of children: 1  . Years of education: 11   Occupational History  .      disability   Social  History Main Topics  . Smoking status: Never Smoker  . Smokeless tobacco: Never Used  . Alcohol use No  . Drug use: No  . Sexual activity: Yes    Birth control/ protection: Pill   Other Topics Concern  . Not on file   Social History Narrative   Lives with daughter age 26, granddaughter   Disabled due to "slow learner"    Family History  Problem Relation Age of Onset  . Heart disease Mother   . Cancer Brother     lung    Review of Systems  Constitutional: Negative for chills, fever and weight loss.  HENT: Negative for congestion and hearing loss.   Eyes: Negative for blurred vision and pain.  Respiratory: Negative for cough and shortness of breath.   Cardiovascular: Negative for chest pain and leg swelling.  Gastrointestinal: Negative for abdominal pain, constipation, diarrhea and heartburn.  Genitourinary: Negative for dysuria and frequency.  Musculoskeletal: Negative for falls, joint pain and myalgias.  Neurological: Negative for dizziness, seizures and headaches.  Psychiatric/Behavioral: Negative for depression. The patient is not nervous/anxious and does not have insomnia.     BP 124/80 (BP Location: Right Arm, Patient Position: Sitting, Cuff Size: Normal)   Pulse 70   Temp 97.9 F (36.6 C) (Oral)   Resp 18   Ht 5' 5.25" (1.657 m)   Wt 116 lb 1.9 oz (52.7 kg)  LMP 10/27/2010   SpO2 99%   BMI 19.18 kg/m   Physical Exam BP 124/80 (BP Location: Right Arm, Patient Position: Sitting, Cuff Size: Normal)   Pulse 70   Temp 97.9 F (36.6 C) (Oral)   Resp 18   Ht 5' 5.25" (1.657 m)   Wt 116 lb 1.9 oz (52.7 kg)   LMP 10/27/2010   SpO2 99%   BMI 19.18 kg/m   General Appearance:    Alert, cooperative, no distress, appears stated age  Head:    Normocephalic, without obvious abnormality, atraumatic  Eyes:    PERRL, conjunctiva/corneas clear, EOM's intact, fundi    benign, both eyes  Ears:    Normal TM's and external ear canals, both ears  Nose:   Nares normal,  septum midline, mucosa normal, no drainage    or sinus tenderness  Throat:   Lips, mucosa, and tongue normal; NO teeth , gums normal  Neck:   Supple, symmetrical, trachea midline, no adenopathy;    thyroid:  no enlargement/tenderness/nodules; no carotid   bruit   Back:     Symmetric, no curvature, ROM normal, no CVA tenderness. Mild kyphosis  Lungs:     Clear to auscultation bilaterally, respirations unlabored  Chest Wall:    No tenderness or deformity   Heart:    Regular rate and rhythm, S1 and S2 normal, no murmur, rub   or gallop  Breast Exam:    No tenderness, masses, or nipple abnormality  Abdomen:     Soft, non-tender, bowel sounds active all four quadrants,    no masses, no organomegaly  Genitalia:    declined  Extremities:   Extremities normal, atraumatic, no cyanosis or edema  Pulses:   2+ and symmetric all extremities  Skin:   Skin color, texture, turgor normal, no rashes or lesions.  Hypopigmented spots with fine scale - ?tinea versicolor  Lymph nodes:   Cervical, supraclavicular, and axillary nodes normal  Neurologic:   Normal strength, sensation and reflexes    throughout    1. Annual physical exam   2. Screening for colon cancer  - Cologuard     Patient Instructions  Need mammogram- ordered Need bone density- ordered Need blood tests - get these today I have ordered a cologuard This is a test for colon cancer.  Follow the instructions in the package and I will notify you of your test results  See me in 3 monrths   Health Maintenance for Postmenopausal Women Introduction Menopause is a normal process in which your reproductive ability comes to an end. This process happens gradually over a span of months to years, usually between the ages of 60 and 103. Menopause is complete when you have missed 12 consecutive menstrual periods. It is important to talk with your health care provider about some of the most common conditions that affect postmenopausal women,  such as heart disease, cancer, and bone loss (osteoporosis). Adopting a healthy lifestyle and getting preventive care can help to promote your health and wellness. Those actions can also lower your chances of developing some of these common conditions. What should I know about menopause? During menopause, you may experience a number of symptoms, such as:  Moderate-to-severe hot flashes.  Night sweats.  Decrease in sex drive.  Mood swings.  Headaches.  Tiredness.  Irritability.  Memory problems.  Insomnia. Choosing to treat or not to treat menopausal changes is an individual decision that you make with your health care provider.  What should I know  about heart disease and stroke?  Heart disease, heart attack, and stroke become more likely as you age. This may be due, in part, to the hormonal changes that your body experiences during menopause. These can affect how your body processes dietary fats, triglycerides, and cholesterol. Heart attack and stroke are both medical emergencies. There are many things that you can do to help prevent heart disease and stroke:  Have your blood pressure checked at least every 1-2 years. High blood pressure causes heart disease and increases the risk of stroke.  If you are 43-40 years old, ask your health care provider if you should take aspirin to prevent a heart attack or a stroke.  Do not use any tobacco products, including cigarettes, chewing tobacco, or electronic cigarettes. If you need help quitting, ask your health care provider.  It is important to eat a healthy diet and maintain a healthy weight.  Be sure to include plenty of vegetables, fruits, low-fat dairy products, and lean protein.  Avoid eating foods that are high in solid fats, added sugars, or salt (sodium).  Get regular exercise. This is one of the most important things that you can do for your health.  Try to exercise for at least 150 minutes each week. The type of exercise  that you do should increase your heart rate and make you sweat. This is known as moderate-intensity exercise.  Try to do strengthening exercises at least twice each week. Do these in addition to the moderate-intensity exercise.  Know your numbers.Ask your health care provider to check your cholesterol and your blood glucose. Continue to have your blood tested as directed by your health care provider. What should I know about cancer screening? There are several types of cancer. Take the following steps to reduce your risk and to catch any cancer development as early as possible. Breast Cancer  Practice breast self-awareness.  This means understanding how your breasts normally appear and feel.  It also means doing regular breast self-exams. Let your health care provider know about any changes, no matter how small.  If you are 40 or older, have a clinician do a breast exam (clinical breast exam or CBE) every year. Depending on your age, family history, and medical history, it may be recommended that you also have a yearly breast X-ray (mammogram).  If you have a family history of breast cancer, talk with your health care provider about genetic screening.  If you are at high risk for breast cancer, talk with your health care provider about having an MRI and a mammogram every year.   Cervical, Uterine, and Ovarian Cancer  Your health care provider may recommend that you be screened regularly for cancer of the pelvic organs. These include your ovaries, uterus, and vagina. This screening involves a pelvic exam, which includes checking for microscopic changes to the surface of your cervix (Pap test).  For women ages 21-65, health care providers may recommend a pelvic exam and a Pap test every three years. For women ages 55-65, they may recommend the Pap test and pelvic exam, combined with testing for human papilloma virus (HPV), every five years. Some types of HPV increase your risk of cervical  cancer. Testing for HPV may also be done on women of any age who have unclear Pap test results.  Other health care providers may not recommend any screening for nonpregnant women who are considered low risk for pelvic cancer and have no symptoms. Ask your health care provider if a screening  pelvic exam is right for you.  If you have had past treatment for cervical cancer or a condition that could lead to cancer, you need Pap tests and screening for cancer for at least 20 years after your treatment. If Pap tests have been discontinued for you, your risk factors (such as having a new sexual partner) need to be reassessed to determine if you should start having screenings again. Some women have medical problems that increase the chance of getting cervical cancer. In these cases, your health care provider may recommend that you have screening and Pap tests more often.  If you have a family history of uterine cancer or ovarian cancer, talk with your health care provider about genetic screening.  If you have vaginal bleeding after reaching menopause, tell your health care provider.  There are currently no reliable tests available to screen for ovarian cancer. Lung Cancer  Lung cancer screening is recommended for adults 50-49 years old who are at high risk for lung cancer because of a history of smoking. A yearly low-dose CT scan of the lungs is recommended if you:  Currently smoke.  Have a history of at least 30 pack-years of smoking and you currently smoke or have quit within the past 15 years. A pack-year is smoking an average of one pack of cigarettes per day for one year.  Colorectal Cancer  This type of cancer can be detected and can often be prevented.  Routine colorectal cancer screening usually begins at age 74 and continues through age 53.  If you have risk factors for colon cancer, your health care provider may recommend that you be screened at an earlier age.  If you have a family  history of colorectal cancer, talk with your health care provider about genetic screening.  Your health care provider may also recommend using home test kits to check for hidden blood in your stool.  A small camera at the end of a tube can be used to examine your colon directly (sigmoidoscopy or colonoscopy). This is done to check for the earliest forms of colorectal cancer.  Direct examination of the colon should be repeated every 5-10 years until age 87. However, if early forms of precancerous polyps or small growths are found or if you have a family history or genetic risk for colorectal cancer, you may need to be screened more often. Skin Cancer  Check your skin from head to toe regularly.  Monitor any moles. Be sure to tell your health care provider:  About any new moles or changes in moles, especially if there is a change in a mole's shape or color.  If you have a mole that is larger than the size of a pencil eraser.  If any of your family members has a history of skin cancer, especially at a young age, talk with your health care provider about genetic screening.  Always use sunscreen. Apply sunscreen liberally and repeatedly throughout the day.  Whenever you are outside, protect yourself by wearing long sleeves, pants, a wide-brimmed hat, and sunglasses. What should I know about osteoporosis? Osteoporosis is a condition in which bone destruction happens more quickly than new bone creation. After menopause, you may be at an increased risk for osteoporosis. To help prevent osteoporosis or the bone fractures that can happen because of osteoporosis, the following is recommended:  If you are 23-44 years old, get at least 1,000 mg of calcium and at least 600 mg of vitamin D per day.  If you  are older than age 53 but younger than age 53, get at least 1,200 mg of calcium and at least 600 mg of vitamin D per day.  If you are older than age 53, get at least 1,200 mg of calcium and at least  800 mg of vitamin D per day. Smoking and excessive alcohol intake increase the risk of osteoporosis. Eat foods that are rich in calcium and vitamin D, and do weight-bearing exercises several times each week as directed by your health care provider. What should I know about how menopause affects my mental health? Depression may occur at any age, but it is more common as you become older. Common symptoms of depression include:  Low or sad mood.  Changes in sleep patterns.  Changes in appetite or eating patterns.  Feeling an overall lack of motivation or enjoyment of activities that you previously enjoyed.  Frequent crying spells. Talk with your health care provider if you think that you are experiencing depression. What should I know about immunizations? It is important that you get and maintain your immunizations. These include:  Tetanus, diphtheria, and pertussis (Tdap) booster vaccine.  Influenza every year before the flu season begins.  Your health care provider may also recommend other immunizations. This information is not intended to replace advice given to you by your health care provider. Make sure you discuss any questions you have with your health care provider. Document Released: 06/26/2005 Document Revised: 11/22/2015 Document Reviewed: 02/05/2015  2017 [employer]    Eustace MooreYvonne Sue Knoah Nedeau, MD

## 2016-05-19 NOTE — Patient Instructions (Addendum)
Need mammogram- ordered Need bone density- ordered Need blood tests - get these today I have ordered a cologuard This is a test for colon cancer.  Follow the instructions in the package and I will notify you of your test results  See me in 3 monrths   Health Maintenance for Postmenopausal Women Introduction Menopause is a normal process in which your reproductive ability comes to an end. This process happens gradually over a span of months to years, usually between the ages of 6048 and 4755. Menopause is complete when you have missed 12 consecutive menstrual periods. It is important to talk with your health care provider about some of the most common conditions that affect postmenopausal women, such as heart disease, cancer, and bone loss (osteoporosis). Adopting a healthy lifestyle and getting preventive care can help to promote your health and wellness. Those actions can also lower your chances of developing some of these common conditions. What should I know about menopause? During menopause, you may experience a number of symptoms, such as:  Moderate-to-severe hot flashes.  Night sweats.  Decrease in sex drive.  Mood swings.  Headaches.  Tiredness.  Irritability.  Memory problems.  Insomnia. Choosing to treat or not to treat menopausal changes is an individual decision that you make with your health care provider.  What should I know about heart disease and stroke?  Heart disease, heart attack, and stroke become more likely as you age. This may be due, in part, to the hormonal changes that your body experiences during menopause. These can affect how your body processes dietary fats, triglycerides, and cholesterol. Heart attack and stroke are both medical emergencies. There are many things that you can do to help prevent heart disease and stroke:  Have your blood pressure checked at least every 1-2 years. High blood pressure causes heart disease and increases the risk of  stroke.  If you are 1355-53 years old, ask your health care provider if you should take aspirin to prevent a heart attack or a stroke.  Do not use any tobacco products, including cigarettes, chewing tobacco, or electronic cigarettes. If you need help quitting, ask your health care provider.  It is important to eat a healthy diet and maintain a healthy weight.  Be sure to include plenty of vegetables, fruits, low-fat dairy products, and lean protein.  Avoid eating foods that are high in solid fats, added sugars, or salt (sodium).  Get regular exercise. This is one of the most important things that you can do for your health.  Try to exercise for at least 150 minutes each week. The type of exercise that you do should increase your heart rate and make you sweat. This is known as moderate-intensity exercise.  Try to do strengthening exercises at least twice each week. Do these in addition to the moderate-intensity exercise.  Know your numbers.Ask your health care provider to check your cholesterol and your blood glucose. Continue to have your blood tested as directed by your health care provider. What should I know about cancer screening? There are several types of cancer. Take the following steps to reduce your risk and to catch any cancer development as early as possible. Breast Cancer  Practice breast self-awareness.  This means understanding how your breasts normally appear and feel.  It also means doing regular breast self-exams. Let your health care provider know about any changes, no matter how small.  If you are 40 or older, have a clinician do a breast exam (clinical breast  exam or CBE) every year. Depending on your age, family history, and medical history, it may be recommended that you also have a yearly breast X-ray (mammogram).  If you have a family history of breast cancer, talk with your health care provider about genetic screening.  If you are at high risk for breast  cancer, talk with your health care provider about having an MRI and a mammogram every year.   Cervical, Uterine, and Ovarian Cancer  Your health care provider may recommend that you be screened regularly for cancer of the pelvic organs. These include your ovaries, uterus, and vagina. This screening involves a pelvic exam, which includes checking for microscopic changes to the surface of your cervix (Pap test).  For women ages 21-65, health care providers may recommend a pelvic exam and a Pap test every three years. For women ages 102-65, they may recommend the Pap test and pelvic exam, combined with testing for human papilloma virus (HPV), every five years. Some types of HPV increase your risk of cervical cancer. Testing for HPV may also be done on women of any age who have unclear Pap test results.  Other health care providers may not recommend any screening for nonpregnant women who are considered low risk for pelvic cancer and have no symptoms. Ask your health care provider if a screening pelvic exam is right for you.  If you have had past treatment for cervical cancer or a condition that could lead to cancer, you need Pap tests and screening for cancer for at least 20 years after your treatment. If Pap tests have been discontinued for you, your risk factors (such as having a new sexual partner) need to be reassessed to determine if you should start having screenings again. Some women have medical problems that increase the chance of getting cervical cancer. In these cases, your health care provider may recommend that you have screening and Pap tests more often.  If you have a family history of uterine cancer or ovarian cancer, talk with your health care provider about genetic screening.  If you have vaginal bleeding after reaching menopause, tell your health care provider.  There are currently no reliable tests available to screen for ovarian cancer. Lung Cancer  Lung cancer screening is  recommended for adults 24-31 years old who are at high risk for lung cancer because of a history of smoking. A yearly low-dose CT scan of the lungs is recommended if you:  Currently smoke.  Have a history of at least 30 pack-years of smoking and you currently smoke or have quit within the past 15 years. A pack-year is smoking an average of one pack of cigarettes per day for one year.  Colorectal Cancer  This type of cancer can be detected and can often be prevented.  Routine colorectal cancer screening usually begins at age 108 and continues through age 39.  If you have risk factors for colon cancer, your health care provider may recommend that you be screened at an earlier age.  If you have a family history of colorectal cancer, talk with your health care provider about genetic screening.  Your health care provider may also recommend using home test kits to check for hidden blood in your stool.  A small camera at the end of a tube can be used to examine your colon directly (sigmoidoscopy or colonoscopy). This is done to check for the earliest forms of colorectal cancer.  Direct examination of the colon should be repeated every 5-10 years  until age 45. However, if early forms of precancerous polyps or small growths are found or if you have a family history or genetic risk for colorectal cancer, you may need to be screened more often. Skin Cancer  Check your skin from head to toe regularly.  Monitor any moles. Be sure to tell your health care provider:  About any new moles or changes in moles, especially if there is a change in a mole's shape or color.  If you have a mole that is larger than the size of a pencil eraser.  If any of your family members has a history of skin cancer, especially at a young age, talk with your health care provider about genetic screening.  Always use sunscreen. Apply sunscreen liberally and repeatedly throughout the day.  Whenever you are outside, protect  yourself by wearing long sleeves, pants, a wide-brimmed hat, and sunglasses. What should I know about osteoporosis? Osteoporosis is a condition in which bone destruction happens more quickly than new bone creation. After menopause, you may be at an increased risk for osteoporosis. To help prevent osteoporosis or the bone fractures that can happen because of osteoporosis, the following is recommended:  If you are 20-71 years old, get at least 1,000 mg of calcium and at least 600 mg of vitamin D per day.  If you are older than age 70 but younger than age 45, get at least 1,200 mg of calcium and at least 600 mg of vitamin D per day.  If you are older than age 41, get at least 1,200 mg of calcium and at least 800 mg of vitamin D per day. Smoking and excessive alcohol intake increase the risk of osteoporosis. Eat foods that are rich in calcium and vitamin D, and do weight-bearing exercises several times each week as directed by your health care provider. What should I know about how menopause affects my mental health? Depression may occur at any age, but it is more common as you become older. Common symptoms of depression include:  Low or sad mood.  Changes in sleep patterns.  Changes in appetite or eating patterns.  Feeling an overall lack of motivation or enjoyment of activities that you previously enjoyed.  Frequent crying spells. Talk with your health care provider if you think that you are experiencing depression. What should I know about immunizations? It is important that you get and maintain your immunizations. These include:  Tetanus, diphtheria, and pertussis (Tdap) booster vaccine.  Influenza every year before the flu season begins.  Your health care provider may also recommend other immunizations. This information is not intended to replace advice given to you by your health care provider. Make sure you discuss any questions you have with your health care provider. Document  Released: 06/26/2005 Document Revised: 11/22/2015 Document Reviewed: 02/05/2015  2017 Elsevier

## 2016-05-20 ENCOUNTER — Encounter: Payer: Self-pay | Admitting: Family Medicine

## 2016-05-20 ENCOUNTER — Telehealth: Payer: Self-pay | Admitting: Family Medicine

## 2016-05-20 DIAGNOSIS — M810 Age-related osteoporosis without current pathological fracture: Secondary | ICD-10-CM

## 2016-05-20 LAB — COMPLETE METABOLIC PANEL WITH GFR
ALBUMIN: 4.6 g/dL (ref 3.6–5.1)
ALT: 14 U/L (ref 6–29)
AST: 19 U/L (ref 10–35)
Alkaline Phosphatase: 79 U/L (ref 33–130)
BILIRUBIN TOTAL: 0.4 mg/dL (ref 0.2–1.2)
BUN: 11 mg/dL (ref 7–25)
CALCIUM: 9.6 mg/dL (ref 8.6–10.4)
CO2: 25 mmol/L (ref 20–31)
Chloride: 103 mmol/L (ref 98–110)
Creat: 0.79 mg/dL (ref 0.50–1.05)
GFR, EST NON AFRICAN AMERICAN: 86 mL/min (ref >=60)
GFR, Est African American: >89 mL/min (ref >=60)
Glucose, Bld: 88 mg/dL (ref 65–99)
POTASSIUM: 4.4 mmol/L (ref 3.5–5.3)
Sodium: 137 mmol/L (ref 135–146)
Total Protein: 7.4 g/dL (ref 6.1–8.1)

## 2016-05-20 LAB — URINALYSIS, ROUTINE W REFLEX MICROSCOPIC
BILIRUBIN URINE: NEGATIVE
GLUCOSE, UA: NEGATIVE
HGB URINE DIPSTICK: NEGATIVE
LEUKOCYTES UA: NEGATIVE
Nitrite: NEGATIVE
Protein, ur: NEGATIVE
Specific Gravity, Urine: 1.017 (ref 1.001–1.035)
pH: 5.5 (ref 5.0–8.0)

## 2016-05-20 LAB — URINALYSIS, MICROSCOPIC ONLY
Bacteria, UA: NONE SEEN [HPF]
Casts: NONE SEEN [LPF]
Crystals: NONE SEEN [HPF]
RBC / HPF: NONE SEEN RBC/HPF (ref <=2)
Squamous Epithelial / LPF: NONE SEEN [HPF] (ref <=5)
WBC UA: NONE SEEN WBC/HPF (ref <=5)
Yeast: NONE SEEN [HPF]

## 2016-05-20 LAB — CBC
HEMATOCRIT: 38.7 % (ref 35.0–45.0)
Hemoglobin: 12.6 g/dL (ref 11.7–15.5)
MCH: 30 pg (ref 27.0–33.0)
MCHC: 32.6 g/dL (ref 32.0–36.0)
MCV: 92.1 fL (ref 80.0–100.0)
MPV: 11.8 fL (ref 7.5–12.5)
PLATELETS: 277 10*3/uL (ref 140–400)
RBC: 4.2 MIL/uL (ref 3.80–5.10)
RDW: 14 % (ref 11.0–15.0)
WBC: 7.2 10*3/uL (ref 3.8–10.8)

## 2016-05-20 LAB — VITAMIN D 25 HYDROXY (VIT D DEFICIENCY, FRACTURES): VIT D 25 HYDROXY: 32 ng/mL (ref 30–100)

## 2016-05-20 LAB — LIPID PANEL
Cholesterol: 154 mg/dL (ref <200)
HDL: 88 mg/dL (ref >50)
LDL CALC: 58 mg/dL (ref <100)
Total CHOL/HDL Ratio: 1.8 Ratio (ref <5.0)
Triglycerides: 42 mg/dL (ref <150)
VLDL: 8 mg/dL (ref <30)

## 2016-05-20 LAB — TSH: TSH: 2.66 mIU/L

## 2016-05-20 NOTE — Progress Notes (Signed)
This encounter was created in error - please disregard.

## 2016-05-20 NOTE — Telephone Encounter (Signed)
Order pended

## 2016-05-21 ENCOUNTER — Other Ambulatory Visit: Payer: Self-pay | Admitting: Family Medicine

## 2016-05-21 DIAGNOSIS — Z1231 Encounter for screening mammogram for malignant neoplasm of breast: Secondary | ICD-10-CM

## 2016-05-25 ENCOUNTER — Ambulatory Visit (HOSPITAL_COMMUNITY): Payer: Medicare Other

## 2016-05-25 ENCOUNTER — Inpatient Hospital Stay (HOSPITAL_COMMUNITY): Admission: RE | Admit: 2016-05-25 | Payer: Medicare Other | Source: Ambulatory Visit

## 2016-06-01 ENCOUNTER — Ambulatory Visit (HOSPITAL_COMMUNITY)
Admission: RE | Admit: 2016-06-01 | Discharge: 2016-06-01 | Disposition: A | Discharge status: Home / Self Care | Payer: Medicare Other | Source: Ambulatory Visit | Attending: Family Medicine | Admitting: Family Medicine

## 2016-06-01 DIAGNOSIS — Z1231 Encounter for screening mammogram for malignant neoplasm of breast: Secondary | ICD-10-CM

## 2016-06-01 DIAGNOSIS — M85851 Other specified disorders of bone density and structure, right thigh: Secondary | ICD-10-CM | POA: Insufficient documentation

## 2016-06-01 DIAGNOSIS — M810 Age-related osteoporosis without current pathological fracture: Secondary | ICD-10-CM | POA: Diagnosis not present

## 2016-06-02 ENCOUNTER — Encounter: Payer: Self-pay | Admitting: Family Medicine

## 2016-06-16 ENCOUNTER — Encounter: Payer: Medicare Other | Admitting: Family Medicine

## 2016-06-17 ENCOUNTER — Encounter: Payer: Self-pay | Admitting: Family Medicine

## 2016-06-17 ENCOUNTER — Ambulatory Visit (INDEPENDENT_AMBULATORY_CARE_PROVIDER_SITE_OTHER): Payer: Medicare Other | Admitting: Family Medicine

## 2016-06-17 ENCOUNTER — Other Ambulatory Visit: Payer: Self-pay | Admitting: Family Medicine

## 2016-06-17 VITALS — BP 118/62 | HR 72 | Temp 98.2°F | Resp 18 | Ht 65.25 in | Wt 119.0 lb

## 2016-06-17 DIAGNOSIS — M5137 Other intervertebral disc degeneration, lumbosacral region: Secondary | ICD-10-CM

## 2016-06-17 DIAGNOSIS — M79605 Pain in left leg: Secondary | ICD-10-CM

## 2016-06-17 MED ORDER — NAPROXEN 500 MG PO TABS: 500.0000 mg | ORAL_TABLET | Freq: Two times a day (BID) | ORAL | 0 refills | Status: DC

## 2016-06-17 NOTE — Progress Notes (Signed)
Chief Complaint  Patient presents with  . Leg Pain    left x 5 months   Left leg pain No trauma or injury or overuse Off an d on for 5 months Worse last few days Hurts in low back, to hip, to thigh to knee.  Rubs anterior thigh region to show where it hurts.  Says hip and knee hurt at times.  Hurts at night to sleep.  No numbness or weakness.  No limp.   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    Discussed recent bone scan result and the need to go on a fosamax drug holiday   Patient Active Problem List   Diagnosis Date Noted  . Degeneration of lumbar or lumbosacral intervertebral disc 03/17/2016  . Chronic back pain 03/17/2016  . Mental impairment 03/17/2016  . Osteoporosis 03/17/2016  . HLD (hyperlipidemia) 03/17/2016  . Esotropia, left eye 03/17/2016    Outpatient Encounter Prescriptions as of 06/17/2016  Medication Sig  . Calcium Carbonate-Vitamin D (CALCIUM + D PO) Take 1 tablet by mouth daily.  . pravastatin (PRAVACHOL) 40 MG tablet Take 40 mg by mouth daily.  . naproxen (NAPROSYN) 500 MG tablet Take 1 tablet (500 mg total) by mouth 2 (two) times daily with a meal.   No facility-administered encounter medications on file as of 06/17/2016.     No Known Allergies  Review of Systems  Constitutional: Negative for activity change, appetite change, fatigue and unexpected weight change.  HENT: Negative for congestion and dental problem.   Eyes: Negative for visual disturbance.  Respiratory: Negative for cough and shortness of breath.   Cardiovascular: Negative for chest pain and leg swelling.  Musculoskeletal: Positive for arthralgias and back pain.  Allergic/Immunologic: Negative for environmental allergies and food allergies.  Psychiatric/Behavioral: Positive for sleep disturbance. Negative for dysphoric mood. The patient is not nervous/anxious.     BP 118/62 (BP Location: Right Arm, Patient Position: Sitting, Cuff Size: Normal)   Pulse 72   Temp  98.2 F (36.8 C) (Oral)   Resp 18   Ht 5' 5.25" (1.657 m)   Wt 119 lb (54 kg)   LMP 10/27/2010   SpO2 100%   BMI 19.65 kg/m   Physical Exam  Constitutional: She is oriented to person, place, and time. She appears well-developed and well-nourished.  HENT:  Head: Normocephalic and atraumatic.  Mouth/Throat: Oropharynx is clear and moist.  Eyes: Conjunctivae are normal. Pupils are equal, round, and reactive to light.  Neck: Normal range of motion. Neck supple. No thyromegaly present.  Cardiovascular: Normal rate, regular rhythm and normal heart sounds.   Pulmonary/Chest: Effort normal and breath sounds normal. No respiratory distress.  Musculoskeletal: Normal range of motion. She exhibits no edema.  Mild tenderness Left SI joint area.  No muscle spasm.  Good ROM of back, hip and knee.  Reflexes brisk.  SLR is fully negative supine and seated.    Lymphadenopathy:    She has no cervical adenopathy.  Neurological: She is alert and oriented to person, place, and time. She displays normal reflexes.  Gait normal  Psychiatric: She has a normal mood and affect. Her behavior is normal. Thought content normal.  Nursing note and vitals reviewed.   ASSESSMENT/PLAN:  1. Pain in lateral left lower extremity Normal exam 2. Degeneration of lumbar or lumbosacral intervertebral disc By history, mild by my examination of MRI   Patient Instructions  Continue to stretch and exercise every day Take the naprosyn for pain Take with  food Call for  problems   Eustace Moore, MD

## 2016-06-17 NOTE — Patient Instructions (Signed)
Continue to stretch and exercise every day Take the naprosyn for pain Take with food Call for  problems

## 2016-07-31 ENCOUNTER — Ambulatory Visit (INDEPENDENT_AMBULATORY_CARE_PROVIDER_SITE_OTHER): Payer: Medicare Other | Admitting: Family Medicine

## 2016-07-31 ENCOUNTER — Encounter: Payer: Self-pay | Admitting: Family Medicine

## 2016-07-31 VITALS — BP 118/68 | HR 76 | Temp 97.2°F | Resp 16 | Ht 65.25 in | Wt 123.0 lb

## 2016-07-31 DIAGNOSIS — H547 Unspecified visual loss: Secondary | ICD-10-CM | POA: Diagnosis not present

## 2016-07-31 DIAGNOSIS — H10021 Other mucopurulent conjunctivitis, right eye: Secondary | ICD-10-CM

## 2016-07-31 MED ORDER — TOBRAMYCIN 0.3 % OP OINT: 1.0000 "application " | TOPICAL_OINTMENT | Freq: Three times a day (TID) | OPHTHALMIC | 0 refills | Status: DC

## 2016-07-31 NOTE — Progress Notes (Signed)
Chief Complaint  Patient presents with  . Conjunctivitis    right     SUBJECTIVE:  51 y.o. female with burning, redness, discharge and mattering in right eye for 3 days.  No other symptoms.  No significant prior ophthalmological history. No change in visual acuity, mild photophobia, no severe eye pain.  ROS negative except for above  Past Medical History:  Diagnosis Date  . Blood dyscrasia    "free bleeder "  . Degeneration of lumbar or lumbosacral intervertebral disc 03/17/2016  . Hyperlipidemia   . Osteoporosis    Current Outpatient Prescriptions on File Prior to Visit  Medication Sig Dispense Refill  . Calcium Carbonate-Vitamin D (CALCIUM + D PO) Take 1 tablet by mouth daily.    . naproxen (NAPROSYN) 500 MG tablet TAKE 1 TABLET(500 MG) BY MOUTH TWICE DAILY WITH A MEAL 180 tablet 1  . pravastatin (PRAVACHOL) 40 MG tablet Take 40 mg by mouth daily.     No current facility-administered medications on file prior to visit.     BP 118/68 (BP Location: Right Arm, Patient Position: Sitting, Cuff Size: Normal)   Pulse 76   Temp 97.2 F (36.2 C) (Temporal)   Resp 16   Ht 5' 5.25" (1.657 m)   Wt 123 lb (55.8 kg)   LMP 10/27/2010   SpO2 100%   BMI 20.31 kg/m   OBJECTIVE:   Patient appears well, vitals signs are normal. Eyes: right eye with findings of typical conjunctivitis noted; erythema and discharge. PERRLA, no foreign body noted. No periorbital cellulitis. The corneas are clear and fundi normal. Visual acuity normal.  General appearance: alert, appears stated age and no distress Head: Normocephalic, without obvious abnormality, atraumatic Ears: normal TM's and external ear canals both ears Nose: Nares normal. Septum midline. Mucosa normal. No drainage or sinus tenderness. Throat: lips, mucosa, and tongue normal; teeth and gums normal Neck: no adenopathy and thyroid not enlarged, symmetric, no tenderness/mass/nodules Lungs: clear to auscultation bilaterally Heart:  regular rate and rhythm, S1, S2 normal, no murmur, click, rub or gallop   ASSESSMENT:   Conjunctivitis - probably bacterial  PLAN:   Antibiotic drops per order. Hygiene discussed. If other family members develop same condition, may use same medication for them if they are not known to be allergic to it. Call prn.

## 2016-07-31 NOTE — Patient Instructions (Addendum)
Use the eye ointment as directed until the redness clears You  May use in both eyes Wash hands frequently if touching the eye area  I have placed referral to eye doctor  Return as needed   Bacterial Conjunctivitis Bacterial conjunctivitis is an infection of your conjunctiva. This is the clear membrane that covers the white part of your eye and the inner surface of your eyelid. This condition can make your eye:  Red or pink.  Itchy. This condition is caused by bacteria. This condition spreads very easily from person to person (is contagious) and from one eye to the other eye. Follow these instructions at home: Medicines   Take or apply your antibiotic medicine as told by your doctor. Do not stop taking or applying the antibiotic even if you start to feel better.  Take or apply over-the-counter and prescription medicines only as told by your doctor.  Do not touch your eyelid with the eye drop bottle or the ointment tube. Managing discomfort   Wipe any fluid from your eye with a warm, wet washcloth or a cotton ball.  Place a cool, clean washcloth on your eye. Do this for 10-20 minutes, 3-4 times per day. General instructions   Do not wear contact lenses until the irritation is gone. Wear glasses until your doctor says it is okay to wear contacts.  Do not wear eye makeup until your symptoms are gone. Throw away any old makeup.  Change or wash your pillowcase every day.  Do not share towels or washcloths with anyone.  Wash your hands often with soap and water. Use paper towels to dry your hands.  Do not touch or rub your eyes.  Do not drive or use heavy machinery if your vision is blurry. Contact a doctor if:  You have a fever.  Your symptoms do not get better after 10 days. Get help right away if:  You have a fever and your symptoms suddenly get worse.  You have very bad pain when you move your eye.  Your face:  Hurts.  Is red.  Is swollen.  You have sudden  loss of vision. This information is not intended to replace advice given to you by your health care provider. Make sure you discuss any questions you have with your health care provider. Document Released: 02/11/2008 Document Revised: 10/10/2015 Document Reviewed: 02/14/2015 Elsevier Interactive Patient Education  2017 [employer]

## 2016-08-17 ENCOUNTER — Encounter: Payer: Self-pay | Admitting: Family Medicine

## 2016-08-17 ENCOUNTER — Ambulatory Visit (INDEPENDENT_AMBULATORY_CARE_PROVIDER_SITE_OTHER): Payer: Medicare Other | Admitting: Family Medicine

## 2016-08-17 VITALS — BP 114/62 | HR 64 | Temp 97.6°F | Resp 16 | Ht 65.0 in | Wt 120.0 lb

## 2016-08-17 DIAGNOSIS — E785 Hyperlipidemia, unspecified: Secondary | ICD-10-CM

## 2016-08-17 DIAGNOSIS — G8929 Other chronic pain: Secondary | ICD-10-CM

## 2016-08-17 DIAGNOSIS — M25552 Pain in left hip: Secondary | ICD-10-CM | POA: Diagnosis not present

## 2016-08-17 DIAGNOSIS — M8580 Other specified disorders of bone density and structure, unspecified site: Secondary | ICD-10-CM

## 2016-08-17 MED ORDER — PRAVASTATIN SODIUM 40 MG PO TABS: 40.0000 mg | ORAL_TABLET | Freq: Every day | ORAL | 3 refills | Status: DC

## 2016-08-17 NOTE — Progress Notes (Signed)
Chief Complaint  Patient presents with  . Follow-up    3 month   Doing well Still had right hip pain Worse with weight bearing Also hurts first thing in the morning Known DJD lumbar spine, but this does not feel like sciatica No trauma  Needs refill pravastatin  Took fosamax for 5 y and her follow up dexa showed improvement, to osteopenia.  Now on calcium with D Encouraged to walk daily   Patient Active Problem List   Diagnosis Date Noted  . Degeneration of lumbar or lumbosacral intervertebral disc 03/17/2016  . Chronic back pain 03/17/2016  . Mental impairment 03/17/2016  . Osteopenia determined by x-ray 03/17/2016  . HLD (hyperlipidemia) 03/17/2016  . Esotropia, left eye 03/17/2016    Outpatient Encounter Prescriptions as of 08/17/2016  Medication Sig  . Calcium Carbonate-Vitamin D (CALCIUM + D PO) Take 1 tablet by mouth daily.  . naproxen (NAPROSYN) 500 MG tablet TAKE 1 TABLET(500 MG) BY MOUTH TWICE DAILY WITH A MEAL  . pravastatin (PRAVACHOL) 40 MG tablet Take 1 tablet (40 mg total) by mouth daily.  Marland Kitchen tobramycin (TOBREX) 0.3 % ophthalmic ointment Place 1 application into both eyes 3 (three) times daily.   No facility-administered encounter medications on file as of 08/17/2016.     No Known Allergies  Review of Systems  Constitutional: Positive for fatigue. Negative for activity change, appetite change and unexpected weight change.  HENT: Negative for congestion, dental problem, postnasal drip and rhinorrhea.   Eyes: Negative for redness and visual disturbance.  Respiratory: Negative for cough and shortness of breath.   Cardiovascular: Negative for chest pain, palpitations and leg swelling.  Gastrointestinal: Negative for abdominal pain, constipation and diarrhea.  Genitourinary: Negative for difficulty urinating, frequency and menstrual problem.  Musculoskeletal: Positive for arthralgias and gait problem. Negative for back pain.  Neurological: Negative for  dizziness and headaches.  Psychiatric/Behavioral: Negative for dysphoric mood and sleep disturbance. The patient is not nervous/anxious.     BP 114/62 (BP Location: Right Arm, Patient Position: Sitting, Cuff Size: Normal)   Pulse 64   Temp 97.6 F (36.4 C) (Temporal)   Resp 16   Ht  (1.651 m)   Wt 120 lb 0.6 oz (54.4 kg)   LMP 10/27/2010   SpO2 98%   BMI 19.98 kg/m   Physical Exam  Constitutional: She appears well-developed and well-nourished.  HENT:  Head: Normocephalic and atraumatic.  Mouth/Throat: Oropharynx is clear and moist.  Eyes: Conjunctivae are normal. Pupils are equal, round, and reactive to light.  Neck: Normal range of motion. Neck supple.  Cardiovascular: Normal rate, regular rhythm and normal heart sounds.   Pulmonary/Chest: Effort normal and breath sounds normal. No respiratory distress.  Abdominal: Soft. Bowel sounds are normal.  Musculoskeletal: Normal range of motion. She exhibits no edema.  Right hip nontender,  SLR negative, ROM good  Lymphadenopathy:    She has no cervical adenopathy.  Neurological: She is alert.  Gait normal  Skin: Skin is warm and dry.  Psychiatric: She has a normal mood and affect. Her behavior is normal. Thought content normal.  Poor knowledge, slow demeanor  Nursing note and vitals reviewed.   ASSESSMENT/PLAN:  1. Chronic left hip pain Continue naproxen prn - Ambulatory referral to Orthopedic Surgery  2. Osteopenia determined by x-ray Exercise, calcium, vit D  3. Hyperlipidemia, unspecified hyperlipidemia type Cholesterol diet Walk Take med Check in 6 motnhs   Patient Instructions   EYE doctor appointment:  Scheduled with Dr.  Cotter on 09/07/16 at 9:40     Continue to stay as active as you can manage I have placed a referral to orthopedic surgeon to look at your hip  I have refilled your pravastatin Return to see mw with lab testing the week prior in six months   Eustace Moore, MD

## 2016-08-17 NOTE — Patient Instructions (Addendum)
EYE doctor appointment:  Scheduled with Dr. Charise Killian on 09/07/16 at 9:40     Continue to stay as active as you can manage I have placed a referral to orthopedic surgeon to look at your hip  I have refilled your pravastatin Return to see mw with lab testing the week prior in six months

## 2016-08-20 ENCOUNTER — Ambulatory Visit (INDEPENDENT_AMBULATORY_CARE_PROVIDER_SITE_OTHER): Payer: Self-pay | Admitting: Orthopaedic Surgery

## 2016-08-27 ENCOUNTER — Ambulatory Visit (INDEPENDENT_AMBULATORY_CARE_PROVIDER_SITE_OTHER): Payer: Medicaid Other | Admitting: Orthopaedic Surgery

## 2016-09-22 ENCOUNTER — Other Ambulatory Visit: Payer: Self-pay | Admitting: Family Medicine

## 2017-02-16 ENCOUNTER — Encounter: Payer: Self-pay | Admitting: Family Medicine

## 2017-02-16 ENCOUNTER — Encounter (INDEPENDENT_AMBULATORY_CARE_PROVIDER_SITE_OTHER): Payer: Self-pay

## 2017-02-16 ENCOUNTER — Other Ambulatory Visit: Payer: Self-pay | Admitting: Family Medicine

## 2017-02-16 ENCOUNTER — Ambulatory Visit (INDEPENDENT_AMBULATORY_CARE_PROVIDER_SITE_OTHER): Payer: Medicare Other | Admitting: Family Medicine

## 2017-02-16 VITALS — BP 116/80 | HR 68 | Temp 98.0°F | Resp 16 | Ht 65.0 in | Wt 120.1 lb

## 2017-02-16 DIAGNOSIS — G8929 Other chronic pain: Secondary | ICD-10-CM

## 2017-02-16 DIAGNOSIS — M5137 Other intervertebral disc degeneration, lumbosacral region: Secondary | ICD-10-CM | POA: Diagnosis not present

## 2017-02-16 DIAGNOSIS — M25552 Pain in left hip: Secondary | ICD-10-CM | POA: Diagnosis not present

## 2017-02-16 DIAGNOSIS — E785 Hyperlipidemia, unspecified: Secondary | ICD-10-CM | POA: Diagnosis not present

## 2017-02-16 DIAGNOSIS — M51379 Other intervertebral disc degeneration, lumbosacral region without mention of lumbar back pain or lower extremity pain: Secondary | ICD-10-CM

## 2017-02-16 MED ORDER — MELOXICAM 15 MG PO TABS: 15.0000 mg | ORAL_TABLET | Freq: Every day | ORAL | 0 refills | Status: DC

## 2017-02-16 NOTE — Progress Notes (Signed)
Chief Complaint  Patient presents with  . Follow-up    6 month   Follow up Did not go to the orthopedic provider because of transportation.  Wants to stay in [place].  Still with hip and back pain she rates as a "10"  Naproxen didn't work.  Will try mobic.  Uncertain about a flu shot.  It is recommended.  Patient Active Problem List   Diagnosis Date Noted  . Degeneration of lumbar or lumbosacral intervertebral disc 03/17/2016  . Chronic back pain 03/17/2016  . Mental impairment 03/17/2016  . Osteopenia determined by x-ray 03/17/2016  . HLD (hyperlipidemia) 03/17/2016  . Esotropia, left eye 03/17/2016    Outpatient Encounter Prescriptions as of 02/16/2017  Medication Sig  . Calcium Carbonate-Vitamin D (CALCIUM + D PO) Take 1 tablet by mouth daily.  . pravastatin (PRAVACHOL) 40 MG tablet Take 1 tablet (40 mg total) by mouth daily.  . meloxicam (MOBIC) 15 MG tablet Take 1 tablet (15 mg total) by mouth daily.   No facility-administered encounter medications on file as of 02/16/2017.     No Known Allergies  Review of Systems  Constitutional: Positive for fatigue. Negative for activity change, appetite change and unexpected weight change.  HENT: Negative for congestion, dental problem, postnasal drip and rhinorrhea.   Eyes: Negative for redness and visual disturbance.  Respiratory: Negative for cough and shortness of breath.   Cardiovascular: Negative for chest pain, palpitations and leg swelling.  Gastrointestinal: Negative for abdominal pain, constipation and diarrhea.  Genitourinary: Negative for difficulty urinating, frequency and menstrual problem.  Musculoskeletal: Positive for arthralgias and gait problem. Negative for back pain.  Neurological: Negative for dizziness and headaches.  Psychiatric/Behavioral: Negative for dysphoric mood and sleep disturbance. The patient is not nervous/anxious.      BP 116/80 (BP Location: Right Arm, Patient Position: Sitting, Cuff  Size: Normal)   Pulse 68   Temp 98 F (36.7 C) (Oral)   Resp 16   Ht  (1.651 m)   Wt 120 lb 1.3 oz (54.5 kg)   LMP 10/27/2010   SpO2 100%   BMI 19.98 kg/m   Physical Exam  Constitutional: She appears well-developed and well-nourished.  HENT:  Head: Normocephalic and atraumatic.  Mouth/Throat: Oropharynx is clear and moist.  Eyes: Pupils are equal, round, and reactive to light. Conjunctivae are normal.  Neck: Normal range of motion. Neck supple.  Cardiovascular: Normal rate, regular rhythm and normal heart sounds.   Pulmonary/Chest: Effort normal and breath sounds normal. No respiratory distress.  Abdominal: Soft. Bowel sounds are normal.  Musculoskeletal: Normal range of motion. She exhibits no edema.  Both hips nontender,  SLR negative, ROM good  Lymphadenopathy:    She has no cervical adenopathy.  Neurological: She is alert.  Gait mildly antalgic  Skin: Skin is warm and dry.  Psychiatric: She has a normal mood and affect. Her behavior is normal. Thought content normal.  Poor knowledge, slow demeanor  Nursing note and vitals reviewed.   ASSESSMENT/PLAN:  1. Chronic left hip pain - Ambulatory referral to Orthopedics  2. Degeneration of lumbar or lumbosacral intervertebral disc  3. Hyperlipidemia, unspecified hyperlipidemia type - CBC - COMPLETE METABOLIC PANEL WITH GFR - Lipid panel - Urinalysis, Routine w reflex microscopic   Patient Instructions  I referred you to orthopedic for the hip pain We can get an appointment in   Take mobic once a day This is for arthritis pain  need blood work and a physical appointment in  January   Eustace Moore, MD

## 2017-02-16 NOTE — Patient Instructions (Signed)
I referred you to orthopedic for the hip pain We can get an appointment in [place]  Take mobic once a day This is for arthritis pain  need blood work and a physical appointment in January

## 2017-02-17 LAB — URINALYSIS, ROUTINE W REFLEX MICROSCOPIC
Bilirubin Urine: NEGATIVE
Glucose, UA: NEGATIVE
Hgb urine dipstick: NEGATIVE
Ketones, ur: NEGATIVE
LEUKOCYTES UA: NEGATIVE
NITRITE: NEGATIVE
PH: 6.5 (ref 5.0–8.0)
Protein, ur: NEGATIVE
SPECIFIC GRAVITY, URINE: 1.012 (ref 1.001–1.03)

## 2017-02-17 LAB — COMPLETE METABOLIC PANEL WITH GFR
AG RATIO: 1.7 (calc) (ref 1.0–2.5)
ALBUMIN MSPROF: 4.4 g/dL (ref 3.6–5.1)
ALT: 12 U/L (ref 6–29)
AST: 18 U/L (ref 10–35)
Alkaline phosphatase (APISO): 72 U/L (ref 33–130)
BILIRUBIN TOTAL: 0.2 mg/dL (ref 0.2–1.2)
BUN: 11 mg/dL (ref 7–25)
CALCIUM: 9.2 mg/dL (ref 8.6–10.4)
CHLORIDE: 102 mmol/L (ref 98–110)
CO2: 27 mmol/L (ref 20–32)
Creat: 0.79 mg/dL (ref 0.50–1.05)
GFR, EST AFRICAN AMERICAN: 99 mL/min/{1.73_m2} (ref >=60)
GFR, EST NON AFRICAN AMERICAN: 85 mL/min/{1.73_m2} (ref >=60)
GLOBULIN: 2.6 g/dL (ref 1.9–3.7)
Glucose, Bld: 101 mg/dL (ref 65–139)
POTASSIUM: 4.3 mmol/L (ref 3.5–5.3)
SODIUM: 137 mmol/L (ref 135–146)
TOTAL PROTEIN: 7 g/dL (ref 6.1–8.1)

## 2017-02-17 LAB — CBC
HEMATOCRIT: 34 % — AB (ref 35.0–45.0)
HEMOGLOBIN: 11.5 g/dL — AB (ref 11.7–15.5)
MCH: 30 pg (ref 27.0–33.0)
MCHC: 33.8 g/dL (ref 32.0–36.0)
MCV: 88.8 fL (ref 80.0–100.0)
MPV: 12.8 fL — AB (ref 7.5–12.5)
Platelets: 236 10*3/uL (ref 140–400)
RBC: 3.83 10*6/uL (ref 3.80–5.10)
RDW: 13.1 % (ref 11.0–15.0)
WBC: 6.2 10*3/uL (ref 3.8–10.8)

## 2017-02-17 LAB — LIPID PANEL
Cholesterol: 163 mg/dL (ref <200)
HDL: 71 mg/dL (ref >50)
LDL Cholesterol (Calc): 73 mg/dL (calc)
NON-HDL CHOLESTEROL (CALC): 92 mg/dL (ref <130)
TRIGLYCERIDES: 102 mg/dL (ref <150)
Total CHOL/HDL Ratio: 2.3 (calc) (ref <5.0)

## 2017-02-18 ENCOUNTER — Encounter: Payer: Self-pay | Admitting: Family Medicine

## 2017-02-18 DIAGNOSIS — D649 Anemia, unspecified: Secondary | ICD-10-CM

## 2017-03-17 ENCOUNTER — Ambulatory Visit (INDEPENDENT_AMBULATORY_CARE_PROVIDER_SITE_OTHER): Payer: Medicare Other

## 2017-03-17 ENCOUNTER — Ambulatory Visit (INDEPENDENT_AMBULATORY_CARE_PROVIDER_SITE_OTHER): Payer: Medicare Other | Admitting: Orthopaedic Surgery

## 2017-03-17 ENCOUNTER — Encounter: Payer: Self-pay | Admitting: Orthopaedic Surgery

## 2017-03-17 VITALS — BP 115/71 | HR 83 | Temp 97.8°F | Ht 66.0 in | Wt 121.0 lb

## 2017-03-17 DIAGNOSIS — M25552 Pain in left hip: Secondary | ICD-10-CM | POA: Diagnosis not present

## 2017-03-17 DIAGNOSIS — M5442 Lumbago with sciatica, left side: Secondary | ICD-10-CM

## 2017-03-17 DIAGNOSIS — G8929 Other chronic pain: Secondary | ICD-10-CM

## 2017-03-17 MED ORDER — NAPROXEN 500 MG PO TABS: 500.0000 mg | ORAL_TABLET | Freq: Two times a day (BID) | ORAL | 5 refills | Status: DC

## 2017-03-17 NOTE — Patient Instructions (Signed)

## 2017-03-17 NOTE — Progress Notes (Signed)
Subjective:    Patient ID: Shannon Castaneda, female    DOB: 08-16-1965, 51 y.o.   MRN: [id number]  HPI She has lower back pain that has gotten worse with left sided sciatica over the the last few months.  She has seen Dr. Delton SeeNelson for this.  She has been taking Mobic but it does not help.  She has no trauma, no spasm.  She has used ice, heat, rubs with little help.  She has not had PT.     Review of Systems  HENT: Negative for congestion.   Respiratory: Negative for cough and shortness of breath.   Cardiovascular: Negative for chest pain and leg swelling.  Endocrine: Negative for cold intolerance.  Musculoskeletal: Positive for arthralgias and back pain.  Allergic/Immunologic: Negative for environmental allergies.   Past Medical History:  Diagnosis Date  . Blood dyscrasia    "free bleeder "  . Degeneration of lumbar or lumbosacral intervertebral disc 03/17/2016  . Hyperlipidemia   . Osteoporosis     Past Surgical History:  Procedure Laterality Date  . MULTIPLE EXTRACTIONS WITH ALVEOLOPLASTY  07/27/2011   Procedure: MULTIPLE EXTRACION WITH ALVEOLOPLASTY;  Surgeon: Georgia LopesScott M Jensen, DDS;  Location: [facility];  Service: Oral Surgery;  Laterality: Bilateral;  Insertion of denture.    Current Outpatient Prescriptions on File Prior to Visit  Medication Sig Dispense Refill  . Calcium Carbonate-Vitamin D (CALCIUM + D PO) Take 1 tablet by mouth daily.    . pravastatin (PRAVACHOL) 40 MG tablet Take 1 tablet (40 mg total) by mouth daily. 90 tablet 3   No current facility-administered medications on file prior to visit.     Social History   Social History  . Marital status: Single    Spouse name: N/A  . Number of children: 1  . Years of education: 3512   Occupational History  .      disability   Social History Main Topics  . Smoking status: Never Smoker  . Smokeless tobacco: Never Used  . Alcohol use No  . Drug use: No  . Sexual activity: Yes    Birth control/ protection: Pill    Other Topics Concern  . Not on file   Social History Narrative   Lives with daughter age 53, granddaughter   Disabled due to "slow learner"    Family History  Problem Relation Age of Onset  . Heart disease Mother   . Cancer Brother        lung    BP 115/71   Pulse 83   Temp 97.8 F (36.6 C)   Ht 5\' 6"  (1.676 m)   Wt 121 lb (54.9 kg)   LMP 10/27/2010   BMI 19.53 kg/m      Objective:   Physical Exam  Constitutional: She is oriented to person, place, and time. She appears well-developed and well-nourished.  HENT:  Head: Normocephalic and atraumatic.  Eyes: Pupils are equal, round, and reactive to light. Conjunctivae and EOM are normal.  Neck: Normal range of motion. Neck supple.  Cardiovascular: Normal rate, regular rhythm and intact distal pulses.   Pulmonary/Chest: Effort normal.  Abdominal: Soft.  Musculoskeletal: She exhibits tenderness (Lower back pain, more on left, SLR negative, ROM is full, no spasm, NV intact, reflexes normal.  Leg lengths normal.).  Neurological: She is alert and oriented to person, place, and time. She displays normal reflexes. No cranial nerve deficit. She exhibits normal muscle tone. Coordination normal.  Skin: Skin is warm and dry.  Psychiatric: She has a normal mood and affect. Her behavior is normal. Judgment and thought content normal.  Vitals reviewed.  X-rays were done of the lumbar spine and left hip, reported separately.       Assessment & Plan:   Encounter Diagnoses  Name Primary?  . Chronic left-sided low back pain with left-sided sciatica Yes  . Pain of left hip joint    I have recommended physical therapy and she declines.  I will print out back exercise sheet for her.  Begin Naprosyn, stop the Mobic.  Return in three weeks.  Consider MRI if not improved.  Call if any problem.  Precautions discussed.   Electronically Signed Darreld Mclean, MD 10/31/20182:36 PM

## 2017-04-07 ENCOUNTER — Ambulatory Visit: Payer: Medicare Other | Admitting: Orthopaedic Surgery

## 2017-04-20 ENCOUNTER — Ambulatory Visit: Payer: Medicare Other | Admitting: Orthopaedic Surgery

## 2017-05-20 ENCOUNTER — Ambulatory Visit: Payer: Medicare Other | Admitting: Orthopaedic Surgery

## 2017-05-20 ENCOUNTER — Encounter: Payer: Self-pay | Admitting: Orthopaedic Surgery

## 2017-05-26 ENCOUNTER — Telehealth: Payer: Self-pay | Admitting: Family Medicine

## 2017-05-26 NOTE — Telephone Encounter (Signed)
Patient left message to reschedule appointment on Friday since she is out of town.

## 2017-05-28 ENCOUNTER — Encounter: Payer: Medicare Other | Admitting: Family Medicine

## 2017-06-14 ENCOUNTER — Telehealth: Payer: Self-pay | Admitting: Family Medicine

## 2017-06-14 NOTE — Telephone Encounter (Signed)
Patient left message on nurse line to reschedule her appointment

## 2017-06-22 ENCOUNTER — Encounter: Payer: Medicare Other | Admitting: Family Medicine

## 2017-07-08 ENCOUNTER — Encounter: Payer: Medicare Other | Admitting: Family Medicine

## 2017-07-08 DIAGNOSIS — Q899 Congenital malformation, unspecified: Secondary | ICD-10-CM | POA: Diagnosis not present

## 2017-07-08 DIAGNOSIS — H538 Other visual disturbances: Secondary | ICD-10-CM | POA: Diagnosis not present

## 2017-07-08 DIAGNOSIS — H52203 Unspecified astigmatism, bilateral: Secondary | ICD-10-CM | POA: Diagnosis not present

## 2017-07-08 DIAGNOSIS — H359 Unspecified retinal disorder: Secondary | ICD-10-CM | POA: Diagnosis not present

## 2017-07-26 ENCOUNTER — Encounter: Payer: Self-pay | Admitting: Family Medicine

## 2017-08-05 ENCOUNTER — Telehealth: Payer: Self-pay

## 2017-08-05 NOTE — Telephone Encounter (Signed)
Re-mailed term letter 08/05/17 - Pt said she did not get the 07/26/17 letter we mailed.

## 2017-09-06 ENCOUNTER — Ambulatory Visit: Payer: Medicare Other | Admitting: Family Medicine

## 2017-09-28 ENCOUNTER — Ambulatory Visit: Payer: Self-pay | Admitting: Orthopaedic Surgery

## 2017-11-23 ENCOUNTER — Encounter: Payer: Self-pay | Admitting: Orthopaedic Surgery

## 2017-11-23 ENCOUNTER — Ambulatory Visit: Payer: Self-pay | Admitting: Orthopaedic Surgery

## 2017-11-23 DIAGNOSIS — E559 Vitamin D deficiency, unspecified: Secondary | ICD-10-CM | POA: Diagnosis not present

## 2017-11-23 DIAGNOSIS — M8580 Other specified disorders of bone density and structure, unspecified site: Secondary | ICD-10-CM | POA: Diagnosis not present

## 2017-11-23 DIAGNOSIS — N951 Menopausal and female climacteric states: Secondary | ICD-10-CM | POA: Diagnosis not present

## 2017-11-23 DIAGNOSIS — Z131 Encounter for screening for diabetes mellitus: Secondary | ICD-10-CM | POA: Diagnosis not present

## 2017-11-23 DIAGNOSIS — R739 Hyperglycemia, unspecified: Secondary | ICD-10-CM | POA: Diagnosis not present

## 2017-11-23 DIAGNOSIS — E785 Hyperlipidemia, unspecified: Secondary | ICD-10-CM | POA: Diagnosis not present

## 2017-11-23 DIAGNOSIS — R5383 Other fatigue: Secondary | ICD-10-CM | POA: Diagnosis not present

## 2018-01-10 DIAGNOSIS — D649 Anemia, unspecified: Secondary | ICD-10-CM | POA: Diagnosis not present

## 2018-01-10 DIAGNOSIS — K219 Gastro-esophageal reflux disease without esophagitis: Secondary | ICD-10-CM | POA: Diagnosis not present

## 2018-01-12 DIAGNOSIS — K219 Gastro-esophageal reflux disease without esophagitis: Secondary | ICD-10-CM | POA: Diagnosis not present

## 2018-01-12 DIAGNOSIS — Z836 Family history of other diseases of the respiratory system: Secondary | ICD-10-CM | POA: Diagnosis not present

## 2018-01-12 DIAGNOSIS — D510 Vitamin B12 deficiency anemia due to intrinsic factor deficiency: Secondary | ICD-10-CM | POA: Diagnosis not present

## 2018-01-12 DIAGNOSIS — Z8261 Family history of arthritis: Secondary | ICD-10-CM | POA: Diagnosis not present

## 2018-01-12 DIAGNOSIS — Z008 Encounter for other general examination: Secondary | ICD-10-CM | POA: Diagnosis not present

## 2018-01-12 DIAGNOSIS — R5383 Other fatigue: Secondary | ICD-10-CM | POA: Diagnosis not present

## 2018-01-12 DIAGNOSIS — D538 Other specified nutritional anemias: Secondary | ICD-10-CM | POA: Diagnosis not present

## 2018-02-08 DIAGNOSIS — N951 Menopausal and female climacteric states: Secondary | ICD-10-CM | POA: Diagnosis not present

## 2018-02-08 DIAGNOSIS — Z23 Encounter for immunization: Secondary | ICD-10-CM | POA: Diagnosis not present

## 2018-02-08 DIAGNOSIS — K219 Gastro-esophageal reflux disease without esophagitis: Secondary | ICD-10-CM | POA: Diagnosis not present

## 2018-02-08 DIAGNOSIS — R5383 Other fatigue: Secondary | ICD-10-CM | POA: Diagnosis not present

## 2018-02-08 DIAGNOSIS — M8580 Other specified disorders of bone density and structure, unspecified site: Secondary | ICD-10-CM | POA: Diagnosis not present

## 2018-02-22 DIAGNOSIS — N951 Menopausal and female climacteric states: Secondary | ICD-10-CM | POA: Diagnosis not present

## 2018-02-22 DIAGNOSIS — R5383 Other fatigue: Secondary | ICD-10-CM | POA: Diagnosis not present

## 2018-02-22 DIAGNOSIS — M8580 Other specified disorders of bone density and structure, unspecified site: Secondary | ICD-10-CM | POA: Diagnosis not present

## 2018-06-21 DIAGNOSIS — N951 Menopausal and female climacteric states: Secondary | ICD-10-CM | POA: Diagnosis not present

## 2018-06-21 DIAGNOSIS — M8580 Other specified disorders of bone density and structure, unspecified site: Secondary | ICD-10-CM | POA: Diagnosis not present

## 2018-06-21 DIAGNOSIS — R5383 Other fatigue: Secondary | ICD-10-CM | POA: Diagnosis not present

## 2018-08-05 ENCOUNTER — Encounter (INDEPENDENT_AMBULATORY_CARE_PROVIDER_SITE_OTHER): Payer: Self-pay | Admitting: Internal Medicine

## 2018-09-19 ENCOUNTER — Ambulatory Visit (INDEPENDENT_AMBULATORY_CARE_PROVIDER_SITE_OTHER): Payer: Medicare Other | Admitting: Internal Medicine

## 2018-09-22 DIAGNOSIS — N951 Menopausal and female climacteric states: Secondary | ICD-10-CM | POA: Diagnosis not present

## 2018-09-22 DIAGNOSIS — R5383 Other fatigue: Secondary | ICD-10-CM | POA: Diagnosis not present

## 2018-09-22 DIAGNOSIS — M8580 Other specified disorders of bone density and structure, unspecified site: Secondary | ICD-10-CM | POA: Diagnosis not present

## 2018-09-26 DIAGNOSIS — N951 Menopausal and female climacteric states: Secondary | ICD-10-CM | POA: Diagnosis not present

## 2018-09-26 DIAGNOSIS — M8580 Other specified disorders of bone density and structure, unspecified site: Secondary | ICD-10-CM | POA: Diagnosis not present

## 2018-11-03 DIAGNOSIS — M8580 Other specified disorders of bone density and structure, unspecified site: Secondary | ICD-10-CM | POA: Diagnosis not present

## 2018-11-03 DIAGNOSIS — R5383 Other fatigue: Secondary | ICD-10-CM | POA: Diagnosis not present

## 2018-11-03 DIAGNOSIS — D649 Anemia, unspecified: Secondary | ICD-10-CM | POA: Diagnosis not present

## 2018-11-03 DIAGNOSIS — N951 Menopausal and female climacteric states: Secondary | ICD-10-CM | POA: Diagnosis not present

## 2018-11-03 DIAGNOSIS — E559 Vitamin D deficiency, unspecified: Secondary | ICD-10-CM | POA: Diagnosis not present

## 2018-12-05 DIAGNOSIS — Z0000 Encounter for general adult medical examination without abnormal findings: Secondary | ICD-10-CM | POA: Diagnosis not present

## 2018-12-05 DIAGNOSIS — R109 Unspecified abdominal pain: Secondary | ICD-10-CM | POA: Diagnosis not present

## 2018-12-05 DIAGNOSIS — R100 Acute abdomen: Secondary | ICD-10-CM | POA: Diagnosis not present

## 2018-12-05 DIAGNOSIS — H5310 Unspecified subjective visual disturbances: Secondary | ICD-10-CM | POA: Diagnosis not present

## 2018-12-05 DIAGNOSIS — E785 Hyperlipidemia, unspecified: Secondary | ICD-10-CM | POA: Diagnosis not present

## 2018-12-05 DIAGNOSIS — E559 Vitamin D deficiency, unspecified: Secondary | ICD-10-CM | POA: Diagnosis not present

## 2018-12-05 DIAGNOSIS — Z0001 Encounter for general adult medical examination with abnormal findings: Secondary | ICD-10-CM | POA: Diagnosis not present

## 2018-12-05 DIAGNOSIS — Z1159 Encounter for screening for other viral diseases: Secondary | ICD-10-CM | POA: Diagnosis not present

## 2019-01-16 DIAGNOSIS — E559 Vitamin D deficiency, unspecified: Secondary | ICD-10-CM | POA: Diagnosis not present

## 2019-01-16 DIAGNOSIS — R5383 Other fatigue: Secondary | ICD-10-CM | POA: Diagnosis not present

## 2019-01-16 DIAGNOSIS — K219 Gastro-esophageal reflux disease without esophagitis: Secondary | ICD-10-CM | POA: Diagnosis not present

## 2019-02-21 ENCOUNTER — Encounter (INDEPENDENT_AMBULATORY_CARE_PROVIDER_SITE_OTHER): Payer: Medicaid Other | Admitting: Ophthalmology

## 2019-03-07 NOTE — Progress Notes (Signed)
[facility] Note  03/10/2019     CHIEF COMPLAINT Patient presents for Retina Evaluation   HISTORY OF PRESENT ILLNESS: Shannon Castaneda is a 53 y.o. female who presents to the clinic today for:   HPI    Retina Evaluation    In both eyes.  Duration of 2 months.  Associated Symptoms Floaters, Flashes and Pain.  Negative for Trauma, Fever, Weight Loss, Scalp Tenderness, Redness, Distortion, Jaw Claudication, Photophobia, Fatigue, Glare, Blind Spot and Shoulder/Hip pain.  Context:  reading and distance vision.  Treatments tried include no treatments.  I, the attending physician,  performed the HPI with the patient and updated documentation appropriately.          Comments    Retina Eval per patients PCP. Dr.Nimish Gosrani. Patient states she is not Diabetic. Patient states her last eye exam was a year ago with Dr. Lanny Cramp. Dr. Lanny Cramp told patient she has a eye disease and patient is unsure of what it is. Patient sees floating spots in her vision, has trouble reading at times. Patient states eye are dry most of the time..       Last edited by Bernarda Caffey, MD on 03/12/2019  1:16 AM. (History)    pt states she was sent here by her PCP, but she is unsure why, pt has no previous hx of retinal problems, pt denies being diabetic, pts regular eye dr is Dr. Lanny Cramp in [place], [place], pt denies hx of eye problems or sx, pt states she has been seeing floaters for the past 3-4 months  Referring physician: Doree Albee, MD [place],  [place] [address]  HISTORICAL INFORMATION:   Selected notes from the MEDICAL RECORD NUMBER Referral by Dr. Anastasio Champion for diabetic retinal eval  LEE: unknown PMH: DME   CURRENT MEDICATIONS: No current outpatient medications on file. (Ophthalmic Drugs)   No current facility-administered medications for this visit.  (Ophthalmic Drugs)   Current Outpatient Medications (Other)  Medication Sig  . Calcium Carbonate-Vitamin D (CALCIUM + D  PO) Take 1 tablet by mouth daily.  . pantoprazole (PROTONIX) 40 MG tablet Take 40 mg by mouth daily.  . naproxen (NAPROSYN) 500 MG tablet Take 1 tablet (500 mg total) by mouth 2 (two) times daily with a meal. (Patient not taking: Reported on 03/10/2019)  . pravastatin (PRAVACHOL) 40 MG tablet Take 1 tablet (40 mg total) by mouth daily. (Patient not taking: Reported on 03/10/2019)   No current facility-administered medications for this visit.  (Other)      REVIEW OF SYSTEMS: ROS    Positive for: Gastrointestinal, Musculoskeletal, Eyes   Negative for: Constitutional, Neurological, Skin, Genitourinary, HENT, Endocrine, Cardiovascular, Respiratory, Psychiatric, Allergic/Imm, Heme/Lymph   Last edited by Elmore Guise, COT on 03/10/2019  9:38 AM. (History)       ALLERGIES No Known Allergies  PAST MEDICAL HISTORY Past Medical History:  Diagnosis Date  . Blood dyscrasia    "free bleeder "  . Degeneration of lumbar or lumbosacral intervertebral disc 03/17/2016  . Hyperlipidemia   . Osteoporosis    Past Surgical History:  Procedure Laterality Date  . MULTIPLE EXTRACTIONS WITH ALVEOLOPLASTY  07/27/2011   Procedure: MULTIPLE EXTRACION WITH ALVEOLOPLASTY;  Surgeon: Gae Bon, DDS;  Location: [facility];  Service: Oral Surgery;  Laterality: Bilateral;  Insertion of denture.    FAMILY HISTORY Family History  Problem Relation Age of Onset  . Heart disease Mother   . Cancer Brother  lung    SOCIAL HISTORY Social History   Tobacco Use  . Smoking status: Never Smoker  . Smokeless tobacco: Never Used  Substance Use Topics  . Alcohol use: No  . Drug use: No         OPHTHALMIC EXAM:  Base Eye Exam    Visual Acuity (Snellen - Linear)      Right Left   Dist cc 20/40 20/200   Dist ph cc 20/NI 20/50-3   Correction: Glasses       Tonometry (Tonopen, 9:39 AM)      Right Left   Pressure 16 15       Pupils      Dark Light Shape React APD   Right 3 2 Round Brisk  None   Left 3 2 Round Brisk None       Visual Fields      Left Right    Full Full       Extraocular Movement      Right Left    Full, Ortho Full, Ortho       Neuro/Psych    Oriented x3: Yes   Mood/Affect: Normal       Dilation    Both eyes: 1.0% Mydriacyl, 2.5% Phenylephrine @ 9:39 AM        Slit Lamp and Fundus Exam    Slit Lamp Exam      Right Left   Lids/Lashes Dermatochalasis - upper lid, mild Meibomian gland dysfunction Dermatochalasis - upper lid, mild Meibomian gland dysfunction   Conjunctiva/Sclera White and quiet White and quiet   Cornea 1+ inferior Punctate epithelial erosions 1+Punctate epithelial erosions   Anterior Chamber Deep and clear, narrow temporal angle Deep and clear, narrow temporal angle   Iris Round and dilated Round and dilated   Lens 2+ Nuclear sclerosis, 2+ Cortical cataract 2+ Nuclear sclerosis, 2+ Cortical cataract   Vitreous mild syneresis, Posterior vitreous detachment Normal, mild syneresis       Fundus Exam      Right Left   Disc Compact, Pink and Sharp, mild temporal Peripapillary atrophy Pink and Sharp, mild temporal Peripapillary atrophy   C/D Ratio 0.1 0.2   Macula Flat, Blunted foveal reflex, pigment mottling and clumping, No heme or edema Flat, Blunted foveal reflex, pigment mottling and clumping, No heme or edema   Vessels Vascular attenuation, mild Tortuousity Vascular attenuation, mild Tortuousity   Periphery Attached, no heme, No RT/RD on 360 scleral depression  Attached, no heme, No RT/RD on 360 scleral depression         Refraction    Wearing Rx      Sphere Cylinder Axis   Right -3.00 +4.75 066   Left -0.50 +4.50 132   Age: ?       Manifest Refraction (Auto)      Sphere Cylinder Axis Dist VA   Right -3.00 +4.75 064 20/40   Left -2.00 +5.75 109 20/40          IMAGING AND PROCEDURES  Imaging and Procedures for @TODAY @  OCT, Retina - OU - Both Eyes       Right Eye Quality was good. Central Foveal  Thickness: 253. Progression has no prior data. Findings include normal foveal contour, no IRF, no SRF (Abnormal RPE contour).   Left Eye Quality was good. Central Foveal Thickness: 327. Progression has no prior data. Findings include normal foveal contour, no IRF, no SRF (Abnormal RPE countour; partial PVD).   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 CME - cystoid macular edema. PED - pigment epithelial detachment. IRF - intraretinal fluid. SRF - subretinal fluid. EZ - ellipsoid zone. ERM - epiretinal membrane. ORA - outer retinal atrophy. ORT - outer retinal tubulation. SRHM - subretinal hyper-reflective material                 ASSESSMENT/PLAN:    ICD-10-CM   1. Posterior vitreous detachment of right eye  H43.811   2. Retinal edema  H35.81 OCT, Retina - OU - Both Eyes  3. Combined forms of age-related cataract of both eyes  H25.813     1. PVD / vitreous syneresis OD  - pt reports long-standing floaters OD -- not bothersome, no photopsias  - Discussed findings and prognosis  - No RT or RD on 360 peripheral exam  - Reviewed s/s of RT/RD  - Strict return precautions for any such RT/RD signs/symptoms  - clear from a retina standpoint to follow up with primary eye care provider (Optometrist or Gen Ophthalmologist)  2. No retinal edema on exam or OCT  3. Mixed form age related cataract OU  - The symptoms of cataract, surgical options, and treatments and risks were discussed with patient.  - discussed diagnosis and progression  - not yet visually significant  - monitor for now   Ophthalmic Meds Ordered this visit:  No orders of the defined types were placed in this encounter.      Return if symptoms worsen or fail to improve.  There are no Patient Instructions on file for this  visit.   Explained the diagnoses, plan, and follow up with the patient and they expressed understanding.  Patient expressed understanding of the importance of proper follow up care.   This document serves as a record of services personally performed by Karie ChimeraBrian G. Buryl Bamber, MD, PhD. It was created on their behalf by Annalee Gentaaryl Barber, COMT. The creation of this record is the provider's dictation and/or activities during the visit.  Electronically signed by: Annalee Gentaaryl Barber, COMT 03/12/19 1:21 AM   Karie ChimeraBrian G. Chimaobi Casebolt, M.D., Ph.D. Diseases & Surgery of the Retina and Vitreous [facility]  I have reviewed the above documentation for accuracy and completeness, and I agree with the above. Karie ChimeraBrian G. Channel Papandrea, M.D., Ph.D. 03/12/19 1:21 AM    Abbreviations: M myopia (nearsighted); A astigmatism; H hyperopia (farsighted); P presbyopia; Mrx spectacle prescription;  CTL contact lenses; OD right eye; OS left eye; OU both eyes  XT exotropia; ET esotropia; PEK punctate epithelial keratitis; PEE punctate epithelial erosions; DES dry eye syndrome; MGD meibomian gland dysfunction; ATs artificial tears; PFAT's preservative free artificial tears; NSC nuclear sclerotic cataract; PSC posterior subcapsular cataract; ERM epi-retinal membrane; PVD posterior vitreous detachment; RD retinal detachment; DM diabetes mellitus; DR diabetic retinopathy; NPDR non-proliferative diabetic retinopathy; PDR proliferative diabetic retinopathy; CSME clinically significant macular edema; DME diabetic macular edema; dbh dot blot hemorrhages; CWS cotton wool spot; POAG primary open angle glaucoma; C/D cup-to-disc ratio; HVF humphrey visual field; GVF goldmann visual field; OCT optical coherence tomography; IOP intraocular pressure; BRVO Branch retinal vein occlusion; CRVO central retinal vein occlusion; CRAO central retinal artery occlusion; BRAO branch retinal artery occlusion; RT retinal tear; SB scleral buckle; PPV pars plana  vitrectomy; VH Vitreous hemorrhage; PRP panretinal laser photocoagulation; IVK intravitreal kenalog; VMT vitreomacular traction; MH Macular hole;  NVD neovascularization of the disc; NVE neovascularization elsewhere; AREDS age related eye disease study; ARMD age related macular degeneration; POAG primary open angle glaucoma; EBMD epithelial/anterior basement membrane dystrophy; ACIOL anterior chamber intraocular lens; IOL intraocular lens; PCIOL posterior chamber intraocular lens; Phaco/IOL phacoemulsification with intraocular lens placement; Wells photorefractive keratectomy; LASIK laser assisted in situ keratomileusis; HTN hypertension; DM diabetes mellitus; COPD chronic obstructive pulmonary disease

## 2019-03-10 ENCOUNTER — Encounter (INDEPENDENT_AMBULATORY_CARE_PROVIDER_SITE_OTHER): Payer: Self-pay | Admitting: Ophthalmology

## 2019-03-10 ENCOUNTER — Ambulatory Visit (INDEPENDENT_AMBULATORY_CARE_PROVIDER_SITE_OTHER): Payer: Medicare Other | Admitting: Ophthalmology

## 2019-03-10 DIAGNOSIS — H25813 Combined forms of age-related cataract, bilateral: Secondary | ICD-10-CM | POA: Diagnosis not present

## 2019-03-10 DIAGNOSIS — H3581 Retinal edema: Secondary | ICD-10-CM | POA: Diagnosis not present

## 2019-03-10 DIAGNOSIS — H43811 Vitreous degeneration, right eye: Secondary | ICD-10-CM

## 2019-03-12 ENCOUNTER — Encounter (INDEPENDENT_AMBULATORY_CARE_PROVIDER_SITE_OTHER): Payer: Self-pay | Admitting: Ophthalmology

## 2019-04-20 ENCOUNTER — Ambulatory Visit (INDEPENDENT_AMBULATORY_CARE_PROVIDER_SITE_OTHER): Payer: Medicare Other | Admitting: Internal Medicine

## 2019-04-25 ENCOUNTER — Ambulatory Visit (INDEPENDENT_AMBULATORY_CARE_PROVIDER_SITE_OTHER): Payer: Medicare Other | Admitting: Nurse Practitioner

## 2019-04-28 ENCOUNTER — Telehealth (INDEPENDENT_AMBULATORY_CARE_PROVIDER_SITE_OTHER): Payer: Medicare Other | Admitting: Nurse Practitioner

## 2019-04-28 ENCOUNTER — Encounter (INDEPENDENT_AMBULATORY_CARE_PROVIDER_SITE_OTHER): Payer: Self-pay | Admitting: Nurse Practitioner

## 2019-04-28 ENCOUNTER — Other Ambulatory Visit: Payer: Self-pay

## 2019-04-28 VITALS — BP 126/78 | HR 70

## 2019-04-28 DIAGNOSIS — E559 Vitamin D deficiency, unspecified: Secondary | ICD-10-CM | POA: Diagnosis not present

## 2019-04-28 DIAGNOSIS — E785 Hyperlipidemia, unspecified: Secondary | ICD-10-CM | POA: Diagnosis not present

## 2019-04-28 DIAGNOSIS — K219 Gastro-esophageal reflux disease without esophagitis: Secondary | ICD-10-CM | POA: Insufficient documentation

## 2019-04-28 MED ORDER — VITAMIN D 125 MCG (5000 UT) PO CAPS
1.0000 | ORAL_CAPSULE | Freq: Every day | ORAL | 1 refills | Status: DC
Start: 2019-04-28 — End: 2020-02-29

## 2019-04-28 MED ORDER — FAMOTIDINE 20 MG PO TABS: 20.0000 mg | ORAL_TABLET | Freq: Every day | ORAL | 0 refills | Status: DC

## 2019-04-28 NOTE — Assessment & Plan Note (Signed)
She will continue to try to live a healthy lifestyle with regular activity and healthy diet.  Will not recommend starting treatment at this time to control her hyperlipidemia.  We will need to monitor this closely and consider collecting this for further evaluation again of this upcoming summer.

## 2019-04-28 NOTE — Assessment & Plan Note (Signed)
I do not note any red flag symptoms currently associated with this condition.  I will add famotidine that she can take at night in addition to her daily morning pantoprazole for symptom management.  If her symptoms return after a 6 to 8-week course of adding famotidine I will recommend that she be evaluated by gastroenterology.  She will follow-up in about 6 weeks to discuss this further.

## 2019-04-28 NOTE — Progress Notes (Signed)
Due to national recommendations of social distancing related to the [place] pandemic, an audio/visual tele-health visit was felt to be the most appropriate encounter type for this patient today. I connected with  Shannon Castaneda on 04/28/19 utilizing audio-only technology and verified that I am speaking with the correct person using two identifiers. The patient was located at their home, and I was located at my home office during the encounter. The patient tells me that she has a new phone and is not aware of how to use the video technology and wanted to proceed with audio-only. I discussed the limitations of evaluation and management by telemedicine. The patient expressed understanding and agreed to proceed.      Subjective:  Patient ID: Shannon Castaneda, female    DOB: 11/26/1965  Age: 53 y.o. MRN: [id number]  CC:  Chief Complaint  Patient presents with  . Follow-up      HPI  This patient presents for a virtual visit to follow-up on her chronic conditions.  GERD: At her last visit with Dr. Anastasio Champion she was changed from omeprazole to pantoprazole.  She tells me her heartburn is present but is mildly better.  She does want to know if she can have an increased dose of the pantoprazole.  It is not clear if she has seen gastroenterology, she is not sure if she has for this.  Vitamin D deficiency: At her last visit she was recommended to increase her vitamin D3 intake to 5000 IUs daily.  She tells me that she cannot afford this medication currently and she has not made this change.  Hyperlipidemia: Last lipid panel was collected in July 2020.  At that point ASCVD risk score was about 1.6%.  She is not on any statin medication at this time.  Past Medical History:  Diagnosis Date  . Blood dyscrasia    "free bleeder "  . Degeneration of lumbar or lumbosacral intervertebral disc 03/17/2016  . GERD (gastroesophageal reflux disease)   . Hyperlipidemia   . Osteoporosis   . Vitamin D deficiency        Family History  Problem Relation Age of Onset  . Heart disease Mother   . Cancer Brother        lung    Social History   Social History Narrative   Lives with daughter age 50, granddaughter   Disabled due to "slow learner"   Social History   Tobacco Use  . Smoking status: Never Smoker  . Smokeless tobacco: Never Used  Substance Use Topics  . Alcohol use: No     Current Meds  Medication Sig  . naproxen (NAPROSYN) 500 MG tablet Take 1 tablet (500 mg total) by mouth 2 (two) times daily with a meal.  . pantoprazole (PROTONIX) 40 MG tablet Take 40 mg by mouth daily.    ROS:  Review of Systems  Constitutional: Positive for malaise/fatigue. Negative for chills, fever and weight loss.  Respiratory: Negative for cough, shortness of breath and wheezing.   Cardiovascular: Negative for chest pain and palpitations.  Gastrointestinal: Positive for heartburn. Negative for abdominal pain and blood in stool.  Neurological: Negative for dizziness and headaches.     Objective:   Today's Vitals: BP 126/78   Pulse 70   LMP 10/27/2010  Vitals with BMI 04/28/2019 03/17/2017 02/16/2017  Height - 5\' 6"  5\' 5"   Weight - 121 lbs 120 lbs 1 oz  BMI - 87.86 76.72  Systolic 094 709 628  Diastolic 78 71  80  Pulse 70 83 68     Physical Exam Comprehensive physical exam not completed today as office visit was conducted remotely.  The patient did sound well over the phone.  She was alert and oriented and appeared to have appropriate judgment.  Vital signs were collected utilizing patient's at home equipment.      Assessment   1. Gastroesophageal reflux disease, unspecified whether esophagitis present   2. Vitamin D deficiency   3. Hyperlipidemia, unspecified hyperlipidemia type       Tests ordered No orders of the defined types were placed in this encounter.    Plan: Please see assessment and plan per problem list below.   Meds ordered this encounter  Medications    . famotidine (PEPCID) 20 MG tablet    Sig: Take 1 tablet (20 mg total) by mouth at bedtime.    Dispense:  90 tablet    Refill:  0    Order Specific Question:   Supervising Provider    Answer:   Lilly Cove C [[id number]]  . Cholecalciferol (VITAMIN D) 125 MCG (5000 UT) CAPS    Sig: Take 1 capsule by mouth daily.    Dispense:  90 capsule    Refill:  1    Order Specific Question:   Supervising Provider    Answer:   Wilson Singer [[id number]]   This telephone encounter lasted for 15 minutes and 4 seconds. Patient to follow-up in 6 weeks.  Shannon Paddy, NP

## 2019-04-28 NOTE — Assessment & Plan Note (Addendum)
I have also encouraged her to start taking vitamin D3 5000 IUs daily.  She asked me to send a prescription to see if her insurance company will cover this medication.  I told her it is over-the-counter and that if insurance does not cover that she can call us. She tells me she will notify the office if she cannot afford the vitamin D3.

## 2019-06-12 ENCOUNTER — Telehealth (INDEPENDENT_AMBULATORY_CARE_PROVIDER_SITE_OTHER): Payer: Self-pay

## 2019-06-12 ENCOUNTER — Other Ambulatory Visit (INDEPENDENT_AMBULATORY_CARE_PROVIDER_SITE_OTHER): Payer: Self-pay

## 2019-06-12 DIAGNOSIS — E559 Vitamin D deficiency, unspecified: Secondary | ICD-10-CM

## 2019-06-12 DIAGNOSIS — K219 Gastro-esophageal reflux disease without esophagitis: Secondary | ICD-10-CM

## 2019-06-12 MED ORDER — FAMOTIDINE 20 MG PO TABS: 20.0000 mg | ORAL_TABLET | Freq: Every day | ORAL | 0 refills | Status: DC

## 2019-06-12 NOTE — Telephone Encounter (Signed)
SENT; THANK YOU

## 2019-06-12 NOTE — Telephone Encounter (Signed)
Pt is calling wanting to know if we can omepraeoleder 40 mg , please [facility] [place]

## 2019-06-19 ENCOUNTER — Ambulatory Visit (INDEPENDENT_AMBULATORY_CARE_PROVIDER_SITE_OTHER): Payer: Medicare Other | Admitting: Nurse Practitioner

## 2019-06-19 ENCOUNTER — Encounter (INDEPENDENT_AMBULATORY_CARE_PROVIDER_SITE_OTHER): Payer: Self-pay | Admitting: Nurse Practitioner

## 2019-06-19 VITALS — BP 105/59 | Resp 15 | Ht 62.0 in | Wt 124.0 lb

## 2019-06-19 DIAGNOSIS — R5383 Other fatigue: Secondary | ICD-10-CM | POA: Diagnosis not present

## 2019-06-19 DIAGNOSIS — E785 Hyperlipidemia, unspecified: Secondary | ICD-10-CM | POA: Diagnosis not present

## 2019-06-19 DIAGNOSIS — M8580 Other specified disorders of bone density and structure, unspecified site: Secondary | ICD-10-CM

## 2019-06-19 DIAGNOSIS — K219 Gastro-esophageal reflux disease without esophagitis: Secondary | ICD-10-CM | POA: Diagnosis not present

## 2019-06-19 DIAGNOSIS — E559 Vitamin D deficiency, unspecified: Secondary | ICD-10-CM | POA: Diagnosis not present

## 2019-06-19 DIAGNOSIS — Z13820 Encounter for screening for osteoporosis: Secondary | ICD-10-CM

## 2019-06-19 NOTE — Assessment & Plan Note (Addendum)
She does mention to me that she is feeling more fatigued than normal.  She would like to be evaluated for this.  Depression screen today was negative.  We will collect blood work for further evaluation at next office visit.

## 2019-06-19 NOTE — Assessment & Plan Note (Signed)
No changes necessary management.  We will continue to monitor and consider checking lipid panel at next office visit.

## 2019-06-19 NOTE — Progress Notes (Signed)
Due to national recommendations of social distancing related to the [place] pandemic, an audio/visual tele-health visit was felt to be the most appropriate encounter type for this patient today. I connected with  Shannon Castaneda on 06/19/19 utilizing audio-only technology and verified that I am speaking with the correct person using two identifiers. The patient was located at their home, and I was located at the office of [facility] during the encounter. I discussed the limitations of evaluation and management by telemedicine. The patient expressed understanding and agreed to proceed.  Patient did not have access to technology that allowed her to do visit by video, thus visit via phone was conducted.   Subjective:  Patient ID: Shannon Castaneda, female    DOB: Nov 05, 1965  Age: 54 y.o. MRN: [id number]  CC:  Chief Complaint  Patient presents with  . Gastroesophageal Reflux    follow up  . Hyperlipidemia      HPI  This patient presents for virtual visit today regarding her vitamin D deficiency, hyperlipidemia, GERD, and history of osteopenia.  Vitamin D deficiency: At her last visit I encouraged her to restart her vitamin D3.  I encouraged her to start 5000 IUs daily.  She tells me she has started this.  Hyperlipidemia: Last lipid panel was collected in July 2020 and showed total cholesterol of 202, HDL of 67, triglycerides 74, and LDL of 117.  ASCVD risk score at that time was approximately 1.6%.  She is not on any medication to control her cholesterol.  GERD: She continues to have heartburn intermittently.  She tells me it has been bothering her on and off for approximately 1 year.  She is currently on omeprazole 40 mg daily.  She tells me she does not believe she is undergone any evaluation by gastroenterology for this.  Osteopenia: She had bone density scan back in January 2018.  At that time she was found to be osteopenic.   Past Medical History:  Diagnosis Date  .  Blood dyscrasia    "free bleeder "  . Degeneration of lumbar or lumbosacral intervertebral disc 03/17/2016  . GERD (gastroesophageal reflux disease)   . Hyperlipidemia   . Osteoporosis   . Vitamin D deficiency       Family History  Problem Relation Age of Onset  . Heart disease Mother   . Cancer Brother        lung    Social History   Social History Narrative   Lives with daughter age 26, granddaughter   Disabled due to "slow learner"   Social History   Tobacco Use  . Smoking status: Never Smoker  . Smokeless tobacco: Never Used  Substance Use Topics  . Alcohol use: No     Current Meds  Medication Sig  . Cholecalciferol (VITAMIN D) 125 MCG (5000 UT) CAPS Take 1 capsule by mouth daily.  . famotidine (PEPCID) 20 MG tablet Take 1 tablet (20 mg total) by mouth at bedtime.  . naproxen (NAPROSYN) 500 MG tablet Take 1 tablet (500 mg total) by mouth 2 (two) times daily with a meal.  . pantoprazole (PROTONIX) 40 MG tablet Take 40 mg by mouth daily.    ROS:  Review of Systems  Constitutional: Positive for malaise/fatigue. Negative for fever.  Eyes: Negative for blurred vision and double vision.  Respiratory: Negative for cough, shortness of breath and wheezing.   Cardiovascular: Negative for chest pain and palpitations.  Gastrointestinal: Negative for abdominal pain, blood in stool, heartburn,  nausea and vomiting.  Neurological: Negative for dizziness and headaches.  Psychiatric/Behavioral: Negative for depression and suicidal ideas. The patient has insomnia.      Objective:   Today's Vitals: Resp 15   Ht 5\' 2"  (1.575 m)   Wt 124 lb (56.2 kg)   LMP 10/27/2010   BMI 22.68 kg/m  Vitals with BMI 06/19/2019 04/28/2019 03/17/2017  Height 5\' 2"  - 5\' 6"   Weight 124 lbs - 121 lbs  BMI 22.67 - 19.54  Systolic - 126 115  Diastolic - 78 71  Pulse - 70 83     Physical Exam Comprehensive physical exam not completed today as office visit was conducted remotely.   Unfortunately the patient was unable to complete the visit using video.  She did sound well over the phone, she answered questions appropriately, she was alert and oriented x3, she appeared to have appropriate judgment.    Office Visit from 06/19/2019 in [facility]  PHQ-2 Total Score  0         Assessment   No diagnosis found.    Tests ordered No orders of the defined types were placed in this encounter.    Plan: Please see assessment and plan per problem list below.   No orders of the defined types were placed in this encounter.   Patient to follow-up in 1 month.  This encounter lasted for 14 minutes.  , NP

## 2019-06-19 NOTE — Assessment & Plan Note (Signed)
She will continue on her omeprazole only at this time.  I did recommend that she be seen by gastroenterology for further evaluation.  She tells me she wants to hold off on this for now.  We will discuss this again at her next office visit.

## 2019-06-19 NOTE — Assessment & Plan Note (Signed)
We will collect vitamin D level at next office visit.

## 2019-06-19 NOTE — Assessment & Plan Note (Signed)
We will order a repeat DEXA scan for further evaluation.

## 2019-06-20 ENCOUNTER — Other Ambulatory Visit: Payer: Self-pay | Admitting: Nurse Practitioner

## 2019-06-21 ENCOUNTER — Telehealth (INDEPENDENT_AMBULATORY_CARE_PROVIDER_SITE_OTHER): Payer: Self-pay

## 2019-06-21 NOTE — Telephone Encounter (Signed)
Left voicemail of appointment.

## 2019-06-29 ENCOUNTER — Other Ambulatory Visit: Payer: Self-pay

## 2019-06-29 ENCOUNTER — Ambulatory Visit (HOSPITAL_COMMUNITY)
Admission: RE | Admit: 2019-06-29 | Discharge: 2019-06-29 | Disposition: A | Discharge status: Home / Self Care | Payer: Medicare Other | Source: Ambulatory Visit | Attending: Nurse Practitioner | Admitting: Nurse Practitioner

## 2019-06-29 DIAGNOSIS — Z13820 Encounter for screening for osteoporosis: Secondary | ICD-10-CM | POA: Diagnosis not present

## 2019-06-29 DIAGNOSIS — M8589 Other specified disorders of bone density and structure, multiple sites: Secondary | ICD-10-CM | POA: Diagnosis not present

## 2019-06-29 DIAGNOSIS — M85852 Other specified disorders of bone density and structure, left thigh: Secondary | ICD-10-CM | POA: Diagnosis not present

## 2019-07-03 ENCOUNTER — Other Ambulatory Visit (INDEPENDENT_AMBULATORY_CARE_PROVIDER_SITE_OTHER): Payer: Medicare Other

## 2019-07-03 DIAGNOSIS — K219 Gastro-esophageal reflux disease without esophagitis: Secondary | ICD-10-CM | POA: Diagnosis not present

## 2019-07-03 DIAGNOSIS — M8580 Other specified disorders of bone density and structure, unspecified site: Secondary | ICD-10-CM | POA: Diagnosis not present

## 2019-07-03 DIAGNOSIS — E785 Hyperlipidemia, unspecified: Secondary | ICD-10-CM | POA: Diagnosis not present

## 2019-07-03 DIAGNOSIS — E559 Vitamin D deficiency, unspecified: Secondary | ICD-10-CM | POA: Diagnosis not present

## 2019-07-03 DIAGNOSIS — R5383 Other fatigue: Secondary | ICD-10-CM | POA: Diagnosis not present

## 2019-07-03 NOTE — Progress Notes (Signed)
Patient called. Given results of Dxa scan. PT said she is taking the vitamin D3 daily. Will call if she has another concerns.

## 2019-07-04 ENCOUNTER — Encounter (INDEPENDENT_AMBULATORY_CARE_PROVIDER_SITE_OTHER): Payer: Self-pay | Admitting: Nurse Practitioner

## 2019-07-04 ENCOUNTER — Telehealth (INDEPENDENT_AMBULATORY_CARE_PROVIDER_SITE_OTHER): Payer: Self-pay

## 2019-07-04 LAB — CBC
HCT: 37.3 % (ref 35.0–45.0)
Hemoglobin: 12.4 g/dL (ref 11.7–15.5)
MCH: 30 pg (ref 27.0–33.0)
MCHC: 33.2 g/dL (ref 32.0–36.0)
MCV: 90.3 fL (ref 80.0–100.0)
MPV: 12.2 fL (ref 7.5–12.5)
Platelets: 269 10*3/uL (ref 140–400)
RBC: 4.13 10*6/uL (ref 3.80–5.10)
RDW: 13.1 % (ref 11.0–15.0)
WBC: 5.4 10*3/uL (ref 3.8–10.8)

## 2019-07-04 LAB — COMPLETE METABOLIC PANEL WITH GFR
AG Ratio: 1.6 (calc) (ref 1.0–2.5)
ALT: 39 U/L — ABNORMAL HIGH (ref 6–29)
AST: 32 U/L (ref 10–35)
Albumin: 4.5 g/dL (ref 3.6–5.1)
Alkaline phosphatase (APISO): 92 U/L (ref 37–153)
BUN: 11 mg/dL (ref 7–25)
CO2: 28 mmol/L (ref 20–32)
Calcium: 10 mg/dL (ref 8.6–10.4)
Chloride: 102 mmol/L (ref 98–110)
Creat: 0.79 mg/dL (ref 0.50–1.05)
GFR, Est African American: 98 mL/min/{1.73_m2} (ref >=60)
GFR, Est Non African American: 84 mL/min/{1.73_m2} (ref >=60)
Globulin: 2.9 g/dL (calc) (ref 1.9–3.7)
Glucose, Bld: 93 mg/dL (ref 65–139)
Potassium: 4.3 mmol/L (ref 3.5–5.3)
Sodium: 139 mmol/L (ref 135–146)
Total Bilirubin: 0.3 mg/dL (ref 0.2–1.2)
Total Protein: 7.4 g/dL (ref 6.1–8.1)

## 2019-07-04 LAB — T4, FREE: Free T4: 1.1 ng/dL (ref 0.8–1.8)

## 2019-07-04 LAB — T3, FREE: T3, Free: 3.2 pg/mL (ref 2.3–4.2)

## 2019-07-04 LAB — TSH: TSH: 2.99 mIU/L

## 2019-07-04 LAB — VITAMIN D 25 HYDROXY (VIT D DEFICIENCY, FRACTURES): Vit D, 25-Hydroxy: 57 ng/mL (ref 30–100)

## 2019-07-04 NOTE — Telephone Encounter (Signed)
Shannon Castaneda, [place]  07/03/2019 10:52 AM EST    Patient called. Given results of Dxa scan. PT said she is taking the vitamin D3 daily. Will call if she has another concerns.   [name], NP  07/03/2019 7:45 AM EST    Please call this patient and inform her that her bone density scan shows that she has osteopenia. This is sort of like a precursor to osteoporosis. Thus she should continue making sure she has plenty of calcium in her diet and continue taking her vitamin D3 as was previously recommended. Thank you.   Ms Lamere is stating that she had rather take a shot every 6 months for her Osteopenia she thinks it would work better than taking Vitamin D3, please advise?

## 2019-07-04 NOTE — Progress Notes (Signed)
All blood work is stable except for mildly elevated ALT. May consider repeating CMP at next OV to see if ALT returns to normal. No changes to medication dosages recommended at this time.

## 2019-07-04 NOTE — Telephone Encounter (Signed)
Shannon Castaneda is aware of the recommendation and seems to understand at this time

## 2019-07-04 NOTE — Telephone Encounter (Signed)
Please inform her that in order to determine if she is a candidate for other treatment options we need to determine her FRAX score.  This can be done when I see her at her next office visit.  Please encourage her to continue taking her vitamin D3 until I see her in about 2 weeks.

## 2019-07-20 ENCOUNTER — Other Ambulatory Visit: Payer: Self-pay

## 2019-07-20 ENCOUNTER — Ambulatory Visit (INDEPENDENT_AMBULATORY_CARE_PROVIDER_SITE_OTHER): Payer: Medicare Other | Admitting: Nurse Practitioner

## 2019-07-20 ENCOUNTER — Encounter (INDEPENDENT_AMBULATORY_CARE_PROVIDER_SITE_OTHER): Payer: Self-pay | Admitting: Nurse Practitioner

## 2019-07-20 VITALS — BP 119/65 | HR 69 | Temp 97.3°F | Ht 67.0 in | Wt 124.6 lb

## 2019-07-20 DIAGNOSIS — M8580 Other specified disorders of bone density and structure, unspecified site: Secondary | ICD-10-CM | POA: Diagnosis not present

## 2019-07-20 DIAGNOSIS — R5383 Other fatigue: Secondary | ICD-10-CM

## 2019-07-20 DIAGNOSIS — R748 Abnormal levels of other serum enzymes: Secondary | ICD-10-CM | POA: Diagnosis not present

## 2019-07-20 DIAGNOSIS — E559 Vitamin D deficiency, unspecified: Secondary | ICD-10-CM | POA: Diagnosis not present

## 2019-07-20 LAB — COMPLETE METABOLIC PANEL WITH GFR
AG Ratio: 1.6 (calc) (ref 1.0–2.5)
ALT: 21 U/L (ref 6–29)
AST: 18 U/L (ref 10–35)
Albumin: 4.4 g/dL (ref 3.6–5.1)
Alkaline phosphatase (APISO): 82 U/L (ref 37–153)
BUN: 11 mg/dL (ref 7–25)
CO2: 27 mmol/L (ref 20–32)
Calcium: 9.6 mg/dL (ref 8.6–10.4)
Chloride: 103 mmol/L (ref 98–110)
Creat: 0.73 mg/dL (ref 0.50–1.05)
GFR, Est African American: 107 mL/min/{1.73_m2} (ref >=60)
GFR, Est Non African American: 93 mL/min/{1.73_m2} (ref >=60)
Globulin: 2.7 g/dL (calc) (ref 1.9–3.7)
Glucose, Bld: 105 mg/dL — ABNORMAL HIGH (ref 65–99)
Potassium: 4.2 mmol/L (ref 3.5–5.3)
Sodium: 138 mmol/L (ref 135–146)
Total Bilirubin: 0.3 mg/dL (ref 0.2–1.2)
Total Protein: 7.1 g/dL (ref 6.1–8.1)

## 2019-07-20 LAB — CBC
HCT: 36.2 % (ref 35.0–45.0)
Hemoglobin: 12 g/dL (ref 11.7–15.5)
MCH: 30.2 pg (ref 27.0–33.0)
MCHC: 33.1 g/dL (ref 32.0–36.0)
MCV: 91 fL (ref 80.0–100.0)
MPV: 12.6 fL — ABNORMAL HIGH (ref 7.5–12.5)
Platelets: 280 10*3/uL (ref 140–400)
RBC: 3.98 10*6/uL (ref 3.80–5.10)
RDW: 12.9 % (ref 11.0–15.0)
WBC: 7.3 10*3/uL (ref 3.8–10.8)

## 2019-07-20 LAB — TSH: TSH: 1.98 mIU/L

## 2019-07-20 LAB — T3, FREE: T3, Free: 3 pg/mL (ref 2.3–4.2)

## 2019-07-20 LAB — T4, FREE: Free T4: 1.1 ng/dL (ref 0.8–1.8)

## 2019-07-20 NOTE — Progress Notes (Addendum)
Subjective:  Patient ID: Shannon Castaneda, female    DOB: 1966/04/02  Age: 54 y.o. MRN: [id number]  CC:  Chief Complaint  Patient presents with  . Follow-up    Elevated ALT, osteopenia, vitamin D deficiency, fatigue      HPI  This patient arrives today for the above.  Elevated ALT: Last time blood work was drawn her ALT was mildly elevated.  Prior to this lab ALT and AST were normal.    Osteopenia: She has a history of osteopenia and last bone density scan was completed earlier this year.  T score of her femur was -2.2.  She is on vitamin D 3 supplementation but it does cause her some GI upset.  She also continues on calcium supplement.  She is wondering if she can take an infusion to treat her osteopenia as opposed to the vitamin D3.  She denies any falls within the last year.  Vitamin D deficiency: She continues on 5000 IUs of vitamin D3 daily.  Last serum level was collected last month and was 57.  Fatigue: She complains of fatigue today and is concerned about this.  She would like further evaluation.   Past Medical History:  Diagnosis Date  . Blood dyscrasia    "free bleeder "  . Degeneration of lumbar or lumbosacral intervertebral disc 03/17/2016  . GERD (gastroesophageal reflux disease)   . Hyperlipidemia   . Osteoporosis   . Vitamin D deficiency       Family History  Problem Relation Age of Onset  . Heart disease Mother   . Cancer Brother        lung    Social History   Social History Narrative   Lives with daughter age 62, granddaughter   Disabled due to "slow learner"   Social History   Tobacco Use  . Smoking status: Never Smoker  . Smokeless tobacco: Never Used  Substance Use Topics  . Alcohol use: No     Current Meds  Medication Sig  . Calcium Carbonate-Vitamin D (CALTRATE 600+D PO) Take 1 tablet by mouth.  . Cholecalciferol (VITAMIN D) 125 MCG (5000 UT) CAPS Take 1 capsule by mouth daily.  Marland Kitchen omeprazole (PRILOSEC) 40 MG capsule Take 40  mg by mouth daily.    ROS:  Review of Systems  Constitutional: Positive for malaise/fatigue. Negative for fever and weight loss.  Eyes: Negative for blurred vision and double vision.  Respiratory: Negative for cough, shortness of breath and wheezing.   Cardiovascular: Negative for chest pain and palpitations.  Genitourinary: Negative for dysuria and hematuria.  Neurological: Negative for dizziness and headaches.     Objective:   Today's Vitals: BP 119/65 (BP Location: Left Arm, Patient Position: Sitting, Cuff Size: Normal)   Pulse 69   Temp (!) 97.3 F (36.3 C) (Temporal)   Ht '5\' 7"'$  (1.702 m)   Wt 124 lb 9.6 oz (56.5 kg)   LMP 10/27/2010   SpO2 98%   BMI 19.52 kg/m  Vitals with BMI 07/20/2019 06/19/2019 04/28/2019  Height '5\' 7"'$  '5\' 2"'$  -  Weight 124 lbs 10 oz 124 lbs -  BMI 74.25 95.63 -  Systolic 875 643 329  Diastolic 65 59 78  Pulse 69 - 70     Physical Exam Vitals reviewed.  Constitutional:      General: She is not in acute distress.    Appearance: Normal appearance.  HENT:     Head: Normocephalic and atraumatic.  Neck:  Vascular: No carotid bruit.  Cardiovascular:     Rate and Rhythm: Normal rate and regular rhythm.     Pulses: Normal pulses.     Heart sounds: Normal heart sounds.  Pulmonary:     Effort: Pulmonary effort is normal.     Breath sounds: Normal breath sounds.  Musculoskeletal:     Comments: Kyphosis noted  Skin:    General: Skin is warm and dry.  Neurological:     General: No focal deficit present.     Mental Status: She is alert and oriented to person, place, and time.  Psychiatric:        Mood and Affect: Mood normal.        Behavior: Behavior normal.        Judgment: Judgment normal.    FRAX score: 7.3% chance of major osteoporotic fracture, 1.2% chance of hip fracture over the next 10 years      Assessment   1. Elevated liver enzymes   2. Fatigue, unspecified type   3. Osteopenia determined by x-ray   4. Vitamin D  deficiency     Tests ordered Orders Placed This Encounter  Procedures  . CMP with eGFR([facility])  . TSH  . T3, Free  . T4, Free  . CBC     Plan: 1.  We will recheck CMP for further evaluation of her elevated liver enzymes.  2.  We will check blood work for further evaluation.  Further recommendations may be made based upon these results.  3., 4.  For now she will focus on optimizing her vitamin D intake, and remaining physically active when able.She will follow-up in 6 to 8 weeks with Dr. Anastasio Champion for further discussion regarding her treatment for osteopenia as well as evaluation and management of her fatigue.   No orders of the defined types were placed in this encounter.   Patient to follow-up in 4-6 weeks  Ailene Ards, NP

## 2019-08-30 ENCOUNTER — Ambulatory Visit (INDEPENDENT_AMBULATORY_CARE_PROVIDER_SITE_OTHER): Payer: Medicare Other | Admitting: Internal Medicine

## 2019-09-01 ENCOUNTER — Ambulatory Visit (INDEPENDENT_AMBULATORY_CARE_PROVIDER_SITE_OTHER): Payer: Medicare Other | Admitting: Nurse Practitioner

## 2019-09-01 ENCOUNTER — Other Ambulatory Visit: Payer: Self-pay

## 2019-09-01 DIAGNOSIS — E559 Vitamin D deficiency, unspecified: Secondary | ICD-10-CM

## 2019-09-01 DIAGNOSIS — M8580 Other specified disorders of bone density and structure, unspecified site: Secondary | ICD-10-CM

## 2019-09-01 DIAGNOSIS — K219 Gastro-esophageal reflux disease without esophagitis: Secondary | ICD-10-CM

## 2019-09-01 DIAGNOSIS — E785 Hyperlipidemia, unspecified: Secondary | ICD-10-CM

## 2019-09-01 DIAGNOSIS — R748 Abnormal levels of other serum enzymes: Secondary | ICD-10-CM

## 2019-09-01 DIAGNOSIS — R5383 Other fatigue: Secondary | ICD-10-CM

## 2019-09-01 NOTE — Progress Notes (Signed)
Due to national recommendations of social distancing related to the COVID19 pandemic, an audio/visual tele-health visit was felt to be the most appropriate encounter type for this patient today. I connected with  Shannon Castaneda on 09/01/19 utilizing audio-only technology and verified that I am speaking with the correct person using two identifiers. The patient was located at their home, and I was located at home during the encounter. I discussed the limitations of evaluation and management by telemedicine. The patient expressed understanding and agreed to proceed. The patient did not have access to technology permitting her to use video at the time of the appointment.      Subjective:  Patient ID: Shannon Castaneda, female    DOB: August 03, 1965  Age: 54 y.o. MRN: [id number]  CC:  Chief Complaint  Patient presents with  . Other    Elevated ALT, Vitamin D deficiemcy/Osteopenia, fatigue  . Gastroesophageal Reflux  . Hyperlipidemia      HPI  This patient arrives today for a virtual visit for the above.  Elevated ALT: She was noted to have elevated ALT levels at a previous visit.  Last time I saw her approximately 6 weeks ago we rechecked her metabolic panel and showed that her ALT levels have normalized.  She continues to deny any abdominal pain, changes in her stool, or other worrisome symptoms.  Vitamin D Deficiency/osteopenia: She has a history of both vitamin D deficiency and osteopenia.  She does continue on her vitamin D3 supplement.  Last serum level was checked approximately 6 weeks ago and was within normal range.  Fatigue: She does continue to have significant fatigue.  Last office visit which again was approximately 6 weeks ago, we did check a thyroid panel and these were all within the normal ranges.  GERD: She also has a history of GERD for which she takes PPI for.  She tells me that her symptoms appear to be stable at this time.  Hyperlipidemia: She is due for lipid panel  check.  She is not on any medication to control her cholesterol.   Past Medical History:  Diagnosis Date  . Blood dyscrasia    "free bleeder "  . Degeneration of lumbar or lumbosacral intervertebral disc 03/17/2016  . GERD (gastroesophageal reflux disease)   . Hyperlipidemia   . Osteoporosis   . Vitamin D deficiency       Family History  Problem Relation Age of Onset  . Heart disease Mother   . Cancer Brother        lung    Social History   Social History Narrative   Lives with daughter age 69, granddaughter   Disabled due to "slow learner"   Social History   Tobacco Use  . Smoking status: Never Smoker  . Smokeless tobacco: Never Used  Substance Use Topics  . Alcohol use: No     No outpatient medications have been marked as taking for the 09/01/19 encounter (Office Visit) with [name], NP.    ROS:  Review of Systems  Constitutional: Positive for malaise/fatigue. Negative for fever.  Eyes: Negative for blurred vision and double vision.  Respiratory: Negative for cough, shortness of breath and wheezing.   Cardiovascular: Negative for chest pain and palpitations.  Gastrointestinal: Negative for abdominal pain, constipation and diarrhea.  Neurological: Negative for dizziness and headaches.     Objective:   Today's Vitals: LMP 10/27/2010  Vitals with BMI 09/01/2019 07/20/2019 06/19/2019  Height - 5\' 7"  5\' 2"   Weight -  124 lbs 10 oz 124 lbs  BMI - 78.24 23.53  Systolic (No Data) 614 431  Diastolic (No Data) 65 59  Pulse - 69 -     Physical Exam Comprehensive physical exam not conducted today as office visit was conducted over the telephone.  She did sound well on the phone, she answered questions appropriately, she appeared to have appropriate judgment, and she was alert and oriented.      Assessment and Plan   1. Fatigue, unspecified type   2. Elevated liver enzymes   3. Osteopenia determined by x-ray   4. Vitamin D deficiency   5.  Gastroesophageal reflux disease, unspecified whether esophagitis present   6. Hyperlipidemia, unspecified hyperlipidemia type      Plan: 1.  She will follow-up with Dr. Anastasio Champion next week to discuss this further, and to determine next steps in her treatment plan.  2.  This seems to have resolved, no further work-up will be done at this time.  3.,  4.  She will continue her vitamin D3 supplement as prescribed.  5.  She will continue her PPI as prescribed.  6.  We will consider asking Dr. Anastasio Champion to collect a lipid panel at her next office visit in approximately 1 week.  She was told to come to that visit in a fasting state, and I did define what this means.  She tells me she understands.   Tests ordered No orders of the defined types were placed in this encounter.     No orders of the defined types were placed in this encounter.   Patient to follow-up as scheduled.  The total time for conversation today was 8 minutes and 3 seconds.  Ailene Ards, NP

## 2019-09-05 ENCOUNTER — Encounter (INDEPENDENT_AMBULATORY_CARE_PROVIDER_SITE_OTHER): Payer: Self-pay | Admitting: Internal Medicine

## 2019-09-05 ENCOUNTER — Other Ambulatory Visit: Payer: Self-pay

## 2019-09-05 ENCOUNTER — Ambulatory Visit (INDEPENDENT_AMBULATORY_CARE_PROVIDER_SITE_OTHER): Payer: Medicare Other | Admitting: Internal Medicine

## 2019-09-05 VITALS — BP 120/75 | HR 61 | Temp 97.6°F | Ht 67.0 in | Wt 124.4 lb

## 2019-09-05 DIAGNOSIS — R5383 Other fatigue: Secondary | ICD-10-CM

## 2019-09-05 DIAGNOSIS — E559 Vitamin D deficiency, unspecified: Secondary | ICD-10-CM

## 2019-09-05 DIAGNOSIS — E785 Hyperlipidemia, unspecified: Secondary | ICD-10-CM

## 2019-09-05 LAB — LIPID PANEL
Cholesterol: 234 mg/dL — ABNORMAL HIGH (ref <200)
HDL: 68 mg/dL (ref >=50)
LDL Cholesterol (Calc): 148 mg/dL (calc) — ABNORMAL HIGH
Non-HDL Cholesterol (Calc): 166 mg/dL (calc) — ABNORMAL HIGH (ref <130)
Total CHOL/HDL Ratio: 3.4 (calc) (ref <5.0)
Triglycerides: 76 mg/dL (ref <150)

## 2019-09-05 MED ORDER — THYROID 30 MG PO TABS: 30.0000 mg | ORAL_TABLET | Freq: Every day | ORAL | 3 refills | Status: DC

## 2019-09-05 NOTE — Progress Notes (Signed)
Metrics: Intervention Frequency ACO  Documented Smoking Status Yearly  Screened one or more times in 24 months  Cessation Counseling or  Active cessation medication Past 24 months  Past 24 months   Guideline developer: UpToDate (See UpToDate for funding source) Date Released: 2014       Wellness Office Visit  Subjective:  Patient ID: Shannon Castaneda, female    DOB: 1965/09/10  Age: 54 y.o. MRN: [id number]  CC: This lady comes in because she is feeling very fatigued.  She also needs to have her lipid panel drawn as she has a history of hyperlipidemia. HPI  Shannon Castaneda had seen her recently and everything was stable including normalization of abnormal liver enzymes.  Her main symptom and concern appears to be fatigue.  I see that her T3 is not optimal.  Her vitamin D levels are reasonable. Past Medical History:  Diagnosis Date  . Blood dyscrasia    "free bleeder "  . Degeneration of lumbar or lumbosacral intervertebral disc 03/17/2016  . GERD (gastroesophageal reflux disease)   . Hyperlipidemia   . Osteoporosis   . Vitamin D deficiency       Family History  Problem Relation Age of Onset  . Heart disease Mother   . Cancer Brother        lung    Social History   Social History Narrative   Lives with daughter age 63, granddaughter   Disabled due to "slow learner"   Social History   Tobacco Use  . Smoking status: Never Smoker  . Smokeless tobacco: Never Used  Substance Use Topics  . Alcohol use: No    Current Meds  Medication Sig  . Calcium Carbonate-Vitamin D (CALTRATE 600+D PO) Take 1 tablet by mouth.  . Cholecalciferol (VITAMIN D) 125 MCG (5000 UT) CAPS Take 1 capsule by mouth daily.  Marland Kitchen omeprazole (PRILOSEC) 40 MG capsule Take 40 mg by mouth daily.       Objective:   Today's Vitals: BP 120/75 (BP Location: Left Arm, Patient Position: Sitting, Cuff Size: Normal)   Pulse 61   Temp 97.6 F (36.4 C) (Temporal)   Ht 5\' 7"  (1.702 m)   Wt 124 lb 6.4 oz (56.4 kg)    LMP 10/27/2010   SpO2 98%   BMI 19.48 kg/m  Vitals with BMI 09/05/2019 09/01/2019 07/20/2019  Height 5\' 7"  - 5\' 7"   Weight 124 lbs 6 oz - 124 lbs 10 oz  BMI 19.48 - 19.51  Systolic 120 (No Data) 119  Diastolic 75 (No Data) 65  Pulse 61 - 69     Physical Exam   She looks systemically well.  No new physical findings.    Assessment   1. Fatigue, unspecified type   2. Vitamin D deficiency   3. Hyperlipidemia, unspecified hyperlipidemia type       Tests ordered Orders Placed This Encounter  Procedures  . Lipid panel     Plan: 1. Blood will be drawn for lipid panel. 2. I am going to try her on NP thyroid, off label, for symptoms of thyroid deficiency.  Let see if this will help her.  I warned her of possible side effects. 3. I will see her in about 6 weeks time to see how she is doing and we will check levels again.   Meds ordered this encounter  Medications  . thyroid (NP THYROID) 30 MG tablet    Sig: Take 1 tablet (30 mg total) by mouth daily before breakfast.  Dispense:  30 tablet    Refill:  3    Kaylie Ritter Luther Parody, MD

## 2019-09-20 ENCOUNTER — Other Ambulatory Visit (INDEPENDENT_AMBULATORY_CARE_PROVIDER_SITE_OTHER): Payer: Self-pay | Admitting: Internal Medicine

## 2019-10-09 ENCOUNTER — Telehealth (INDEPENDENT_AMBULATORY_CARE_PROVIDER_SITE_OTHER): Payer: Self-pay

## 2019-10-09 NOTE — Telephone Encounter (Signed)
Shannon Castaneda is stating that her insurance (Medicaid) is no longer paying for her NP Thyroid 30mg  she needs to switch it so insurance will pay, switch to Synthroid (levothryoixine) instead

## 2019-10-09 NOTE — Telephone Encounter (Signed)
Shannon Castaneda is aware 

## 2019-10-09 NOTE — Telephone Encounter (Signed)
Please tell the patient to stop NP thyroid for now since it is going to be a cost issue.  I will address her thyroid when I see her soon in the next couple of weeks.

## 2019-10-24 ENCOUNTER — Ambulatory Visit (INDEPENDENT_AMBULATORY_CARE_PROVIDER_SITE_OTHER): Payer: Medicare Other | Admitting: Internal Medicine

## 2019-11-02 ENCOUNTER — Encounter (INDEPENDENT_AMBULATORY_CARE_PROVIDER_SITE_OTHER): Payer: Self-pay | Admitting: Nurse Practitioner

## 2019-11-02 ENCOUNTER — Other Ambulatory Visit: Payer: Self-pay

## 2019-11-02 ENCOUNTER — Ambulatory Visit (INDEPENDENT_AMBULATORY_CARE_PROVIDER_SITE_OTHER): Payer: Medicare Other | Admitting: Nurse Practitioner

## 2019-11-02 VITALS — BP 110/70 | HR 77 | Temp 97.1°F | Resp 18 | Ht 67.0 in | Wt 124.0 lb

## 2019-11-02 DIAGNOSIS — E559 Vitamin D deficiency, unspecified: Secondary | ICD-10-CM

## 2019-11-02 DIAGNOSIS — R5383 Other fatigue: Secondary | ICD-10-CM

## 2019-11-02 DIAGNOSIS — R21 Rash and other nonspecific skin eruption: Secondary | ICD-10-CM | POA: Diagnosis not present

## 2019-11-02 DIAGNOSIS — E785 Hyperlipidemia, unspecified: Secondary | ICD-10-CM | POA: Diagnosis not present

## 2019-11-02 DIAGNOSIS — R0602 Shortness of breath: Secondary | ICD-10-CM

## 2019-11-02 LAB — COMPLETE METABOLIC PANEL WITH GFR
AG Ratio: 1.7 (calc) (ref 1.0–2.5)
ALT: 20 U/L (ref 6–29)
AST: 20 U/L (ref 10–35)
Albumin: 4.4 g/dL (ref 3.6–5.1)
Alkaline phosphatase (APISO): 79 U/L (ref 37–153)
BUN: 11 mg/dL (ref 7–25)
CO2: 28 mmol/L (ref 20–32)
Calcium: 9.5 mg/dL (ref 8.6–10.4)
Chloride: 104 mmol/L (ref 98–110)
Creat: 0.83 mg/dL (ref 0.50–1.05)
GFR, Est African American: 92 mL/min/{1.73_m2} (ref >=60)
GFR, Est Non African American: 79 mL/min/{1.73_m2} (ref >=60)
Globulin: 2.6 g/dL (calc) (ref 1.9–3.7)
Glucose, Bld: 138 mg/dL — ABNORMAL HIGH (ref 65–99)
Potassium: 4.1 mmol/L (ref 3.5–5.3)
Sodium: 139 mmol/L (ref 135–146)
Total Bilirubin: 0.2 mg/dL (ref 0.2–1.2)
Total Protein: 7 g/dL (ref 6.1–8.1)

## 2019-11-02 LAB — VITAMIN D 25 HYDROXY (VIT D DEFICIENCY, FRACTURES): Vit D, 25-Hydroxy: 72 ng/mL (ref 30–100)

## 2019-11-02 MED ORDER — TRIAMCINOLONE ACETONIDE 0.025 % EX OINT: 1.0000 "application " | TOPICAL_OINTMENT | Freq: Two times a day (BID) | CUTANEOUS | 1 refills | Status: DC

## 2019-11-02 NOTE — Patient Instructions (Signed)
[facility] Dietary Recommendations for Weight Loss What to Avoid . Avoid added sugars o Often added sugar can be found in processed foods such as many condiments, dry cereals, cakes, cookies, chips, crisps, crackers, candies, sweetened drinks, etc.  o Read labels and AVOID/DECREASE use of foods with the following in their ingredient list: Sugar, fructose, high fructose corn syrup, sucrose, glucose, maltose, dextrose, molasses, cane sugar, brown sugar, any type of syrup, agave nectar, etc.   . Avoid snacking in between meals . Avoid foods made with flour o If you are going to eat food made with flour, choose those made with whole-grains; and, minimize your consumption as much as is tolerable . Avoid processed foods o These foods are generally stocked in the middle of the grocery store. Focus on shopping on the perimeter of the grocery.  . Avoid Meat  o We recommend following a plant-based diet at [facility]. Thus, we recommend avoiding meat as a general rule. Consider eating beans, legumes, eggs, and/or dairy products for regular protein sources o If you plan on eating meat limit to 4 ounces of meat at a time and choose lean options such as Fish, chicken, turkey. Avoid red meat intake such as pork and/or steak What to Include . Vegetables o GREEN LEAFY VEGETABLES: Kale, spinach, mustard greens, collard greens, cabbage, broccoli, etc. o OTHER: Asparagus, cauliflower, eggplant, carrots, peas, Brussel sprouts, tomatoes, bell peppers, zucchini, beets, cucumbers, etc. . Grains, seeds, and legumes o Beans: kidney beans, black eyed peas, garbanzo beans, black beans, pinto beans, etc. o Whole, unrefined grains: brown rice, barley, bulgur, oatmeal, etc. . Healthy fats  o Avoid highly processed fats such as vegetable oil o Examples of healthy fats: avocado, olives, virgin olive oil, dark chocolate (?72% Cocoa), nuts (peanuts, almonds, walnuts, cashews, pecans, etc.) . None to Low  Intake of Animal Sources of Protein o Meat sources: chicken, turkey, salmon, tuna. Limit to 4 ounces of meat at one time. o Consider limiting dairy sources, but when choosing dairy focus on: PLAIN Greek yogurt, cottage cheese, high-protein milk . Fruit o Choose berries  When to Eat . Intermittent Fasting: o Choosing not to eat for a specific time period, but DO FOCUS ON HYDRATION when fasting o Multiple Techniques: - Time Restricted Eating: eat 3 meals in a day, each meal lasting no more than 60 minutes, no snacks between meals - 16-18 hour fast: fast for 16 to 18 hours up to 7 days a week. Often suggested to start with 2-3 nonconsecutive days per week.  . Remember the time you sleep is counted as fasting.  . Examples of eating schedule: Fast from 7:00pm-11:00am. Eat between 11:00am-7:00pm.  - 24-hour fast: fast for 24 hours up to every other day. Often suggested to start with 1 day per week . Remember the time you sleep is counted as fasting . Examples of eating schedule:  o Eating day: eat 2-3 meals on your eating day. If doing 2 meals, each meal should last no more than 90 minutes. If doing 3 meals, each meal should last no more than 60 minutes. Finish last meal by 7:00pm. o Fasting day: Fast until 7:00pm.  o IF YOU FEEL UNWELL FOR ANY REASON/IN ANY WAY WHEN FASTING, STOP FASTING BY EATING A NUTRITIOUS SNACK OR LIGHT MEAL o ALWAYS FOCUS ON HYDRATION DURING FASTS - Acceptable Hydration sources: water, broths, tea/coffee (black tea/coffee is best but using a small amount of whole-fat dairy products in coffee/tea is acceptable).  -   Poor Hydration Sources: anything with sugar or artificial sweeteners added to it  These recommendations have been developed for patients that are actively receiving medical care from either Dr. Gosrani or Haeley Fordham, DNP, NP-C at [facility]. These recommendations are developed for patients with specific medical conditions and are not meant to be  distributed or used by others that are not actively receiving care from either provider listed above at [facility]. It is not appropriate to participate in the above eating plans without proper medical supervision.   Reference: Fung, J. The obesity code. [place]: [place]; 2016.   

## 2019-11-02 NOTE — Progress Notes (Signed)
Subjective:  Patient ID: Shannon Castaneda, female    DOB: May 01, 1966  Age: 54 y.o. MRN: [id number]  CC:  Chief Complaint  Patient presents with   Follow-up    fatigue      HPI  HPI  This 54 year old patient presents today for follow-up.  She continues to be tired all the time.  She reports this has not improved on NP thyroid.  She does not want to continue paying $35/month for this medication.  Patient reports being out of breath from walking in her yard.  She states this has been going on for a few months.  She denies any associated symptoms.  She denies dyspnea when lying flat.  Patient complains of a rash to medial bilateral lower legs.  She states this started approximately one month ago.  She reports itching. She states "this rash happens every summer."  She has tried applying vaseline  without relief.  Patient's total cholesterol was elevated on last visit.  She states she has never used cholesterol medication.  She reports a diet that is heavy in eggs and processed meats.  Patient states she knows she is overdue for mammogram, pap smear and colonoscopy. She understands these are important screening exams to catch cancer early. She is not interested in undergoing these screening tests at this time. Patient continues taking all medications as prescribed.         Past Medical History:  Diagnosis Date   Blood dyscrasia    "free bleeder "   Degeneration of lumbar or lumbosacral intervertebral disc 03/17/2016   GERD (gastroesophageal reflux disease)    Hyperlipidemia    Osteoporosis    Vitamin D deficiency       Family History  Problem Relation Age of Onset   Heart disease Mother    Cancer Brother        lung    Social History   Social History Narrative   Lives with daughter age 56, granddaughter   Disabled due to "slow learner"   Social History   Tobacco Use   Smoking status: Never Smoker   Smokeless tobacco: Never Used  Substance Use Topics    Alcohol use: No     Current Meds  Medication Sig   Calcium Carbonate-Vitamin D (CALTRATE 600+D PO) Take 1 tablet by mouth.   Cholecalciferol (VITAMIN D) 125 MCG (5000 UT) CAPS Take 1 capsule by mouth daily.   [DISCONTINUED] thyroid (NP THYROID) 30 MG tablet Take 1 tablet (30 mg total) by mouth daily before breakfast.    ROS:  Review of Systems  Constitutional: Positive for malaise/fatigue.  Respiratory: Positive for shortness of breath. Negative for cough.   Cardiovascular: Negative for chest pain, orthopnea and leg swelling.  Skin: Positive for itching and rash.  Neurological: Negative for dizziness.     Objective:   Today's Vitals: BP 110/70 (BP Location: Right Arm, Patient Position: Sitting, Cuff Size: Small)    Pulse 77    Temp (!) 97.1 F (36.2 C) (Temporal)    Resp 18    Ht _0  (1.702 m)    Wt 124 lb (56.2 kg)    LMP 10/27/2010    SpO2 97%    BMI 19.42 kg/m  Vitals with BMI 11/02/2019 09/05/2019 09/01/2019  Height _1  _2  -  Weight 124 lbs 124 lbs 6 oz -  BMI 13.08 65.78 -  Systolic 469 629 (No Data)  Diastolic 70 75 (No Data)  Pulse 77  61 -     Physical Exam Constitutional:      Appearance: Normal appearance.  Cardiovascular:     Rate and Rhythm: Normal rate and regular rhythm.  Pulmonary:     Effort: Pulmonary effort is normal.     Breath sounds: Normal breath sounds.  Skin:    General: Skin is warm and dry.     Findings: Rash (medial bilateral lower legs.  This is a flattened red rash with no drainage present. ) present.  Neurological:     Mental Status: She is alert.  Psychiatric:        Mood and Affect: Mood normal.        Behavior: Behavior normal.          Assessment and Plan   1. Rash   2. Shortness of breath   3. Fatigue, unspecified type   4. Vitamin D deficiency   5. Hyperlipidemia, unspecified hyperlipidemia type      Plan:  1. Kenalog cream 0.025% ointment.  Discussed this should be used for a maximum of two weeks and she  will call our office if rash is worsening.    2. Cardiac echo 3. Check Vitamin B12. Check CMP with eGFR.   4.Continue Vitamin D supplement.  Check Vitamin D level.   5. Patient will cut back on frequency of processed meats and eggs.  Discussed with her she may need medication for cholesterol in the future.   6.  Patient will take NP thyroid qod until her supply runs out and then stop.    Tests ordered Orders Placed This Encounter  Procedures   Vitamin B12   Vitamin D, 25-hydroxy   CMP with eGFR([facility])   ECHOCARDIOGRAM COMPLETE      Meds ordered this encounter  Medications   triamcinolone (KENALOG) 0.025 % ointment    Sig: Apply 1 application topically 2 (two) times daily.    Dispense:  30 g    Refill:  1    Order Specific Question:   Supervising Provider    Answer:   Doree Albee [[id number]]    Patient to follow-up in 6 weeks.    Lucile Crater, RN

## 2019-11-17 ENCOUNTER — Ambulatory Visit (HOSPITAL_COMMUNITY)
Admission: RE | Admit: 2019-11-17 | Discharge: 2019-11-17 | Disposition: A | Discharge status: Home / Self Care | Payer: Medicare Other | Source: Ambulatory Visit | Attending: Nurse Practitioner | Admitting: Nurse Practitioner

## 2019-11-17 ENCOUNTER — Other Ambulatory Visit: Payer: Self-pay

## 2019-11-17 DIAGNOSIS — R0602 Shortness of breath: Secondary | ICD-10-CM | POA: Diagnosis not present

## 2019-11-17 NOTE — Progress Notes (Signed)
*  PRELIMINARY RESULTS* Echocardiogram 2D Echocardiogram has been performed.  Shannon Castaneda 11/17/2019, 2:47 PM

## 2020-01-23 ENCOUNTER — Other Ambulatory Visit (INDEPENDENT_AMBULATORY_CARE_PROVIDER_SITE_OTHER): Payer: Self-pay | Admitting: Internal Medicine

## 2020-01-23 ENCOUNTER — Telehealth (INDEPENDENT_AMBULATORY_CARE_PROVIDER_SITE_OTHER): Payer: Self-pay | Admitting: Internal Medicine

## 2020-01-23 DIAGNOSIS — L989 Disorder of the skin and subcutaneous tissue, unspecified: Secondary | ICD-10-CM

## 2020-01-23 NOTE — Telephone Encounter (Signed)
Okay, I will get Nellie to refer the patient to dermatology, we do not need to see her on this instance.

## 2020-02-12 ENCOUNTER — Ambulatory Visit (INDEPENDENT_AMBULATORY_CARE_PROVIDER_SITE_OTHER): Payer: Medicare Other | Admitting: Nurse Practitioner

## 2020-02-27 ENCOUNTER — Ambulatory Visit (INDEPENDENT_AMBULATORY_CARE_PROVIDER_SITE_OTHER): Payer: Medicare Other | Admitting: Nurse Practitioner

## 2020-02-27 ENCOUNTER — Other Ambulatory Visit: Payer: Self-pay

## 2020-02-27 ENCOUNTER — Encounter (INDEPENDENT_AMBULATORY_CARE_PROVIDER_SITE_OTHER): Payer: Self-pay | Admitting: Nurse Practitioner

## 2020-02-27 VITALS — BP 108/68 | HR 74 | Temp 96.9°F | Ht 67.0 in | Wt 122.6 lb

## 2020-02-27 DIAGNOSIS — E559 Vitamin D deficiency, unspecified: Secondary | ICD-10-CM | POA: Diagnosis not present

## 2020-02-27 DIAGNOSIS — Z862 Personal history of diseases of the blood and blood-forming organs and certain disorders involving the immune mechanism: Secondary | ICD-10-CM

## 2020-02-27 DIAGNOSIS — M2550 Pain in unspecified joint: Secondary | ICD-10-CM | POA: Diagnosis not present

## 2020-02-27 DIAGNOSIS — R5383 Other fatigue: Secondary | ICD-10-CM | POA: Diagnosis not present

## 2020-02-27 DIAGNOSIS — E785 Hyperlipidemia, unspecified: Secondary | ICD-10-CM

## 2020-02-27 DIAGNOSIS — R0981 Nasal congestion: Secondary | ICD-10-CM

## 2020-02-27 NOTE — Progress Notes (Signed)
Subjective:  Patient ID: Shannon Castaneda, female    DOB: 20-Jul-1965  Age: 54 y.o. MRN: [id number]  CC:  Chief Complaint  Patient presents with   Follow-up    Doing okay, check nose   Fatigue    very tired      HPI  This patient arrives today for the above.  She continues to complain of fatigue.  At last office visit it was decided that she was can stop taking NP thyroid for the off the treatment of her fatigue due to financial barriers.  She tells me she has been off of this for approximately 4 months now and has not felt any different since stopping the thyroid but it does continue to be quite fatigued.  She is concerned about her vitamin B level.  She does have a history of anemia in the past per chart review.  Back in 2018 her hemoglobin was low.  Thus far work-up has shown that she is negative for anemia, negative for thyroid disease, she has undergone cardiac echocardiogram which was normal.  She does mention she wakes up frequently during the night and does not feel that she gets sound sleep.  She does report daytime sleepiness and the need to nap at times.  She has not been evaluated for sleep apnea.  She also has not completed PHQ-9 screen to check for depression.  She does report that she has intermittent joint pain, she does not noticed joint swelling or redness.  She also mentions that she feels a sensation of an object or substance being in her right nare.  He tells me has been present for 4 to 5 months.  She denies any bleeding, pain, difficulty breathing through her nose.  She does report intermittent congestion.  She would like to be evaluated for this today as well.  Of note, she does have a history of hyperlipidemia and I believe in the past we discussed starting medication to control her cholesterol but she had wanted to wait and have lipid panel rechecked before starting medicine.  She is due to have this done today.  She also has a history of vitamin D deficiency and  continues on her vitamin D3 supplement.  She is due to have serum check today.   Past Medical History:  Diagnosis Date   Blood dyscrasia    "free bleeder "   Degeneration of lumbar or lumbosacral intervertebral disc 03/17/2016   GERD (gastroesophageal reflux disease)    Hyperlipidemia    Osteoporosis    Vitamin D deficiency       Family History  Problem Relation Age of Onset   Heart disease Mother    Cancer Brother        lung    Social History   Social History Narrative   Lives with daughter age 4, granddaughter   Disabled due to "slow learner"   Social History   Tobacco Use   Smoking status: Never Smoker   Smokeless tobacco: Never Used  Substance Use Topics   Alcohol use: No     Current Meds  Medication Sig   Calcium Carbonate-Vitamin D (CALTRATE 600+D PO) Take 1 tablet by mouth.   Cholecalciferol (VITAMIN D) 125 MCG (5000 UT) CAPS Take 1 capsule by mouth daily.   [DISCONTINUED] triamcinolone (KENALOG) 0.025 % ointment Apply 1 application topically 2 (two) times daily.    ROS:  See HPI   Objective:   Today's Vitals: BP 108/68    Pulse  74    Temp (!) 96.9 F (36.1 C) (Temporal)    Ht _0  (1.702 m)    Wt 122 lb 9.6 oz (55.6 kg)    LMP 10/27/2010    SpO2 98%    BMI 19.20 kg/m  Vitals with BMI 02/27/2020 11/02/2019 09/05/2019  Height _1  _2  _3   Weight 122 lbs 10 oz 124 lbs 124 lbs 6 oz  BMI 19.2 56.31 49.70  Systolic 263 785 885  Diastolic 68 70 75  Pulse 74 77 61     Physical Exam Vitals reviewed.  Constitutional:      General: She is not in acute distress.    Appearance: Normal appearance.  HENT:     Head: Normocephalic and atraumatic.     Nose: Nose normal. No nasal deformity, septal deviation, nasal tenderness, congestion or rhinorrhea.     Right Nostril: No foreign body, epistaxis, septal hematoma or occlusion.     Left Nostril: No foreign body, epistaxis, septal hematoma or occlusion.     Right Turbinates: Not  enlarged, swollen or pale.     Left Turbinates: Not enlarged, swollen or pale.  Cardiovascular:     Rate and Rhythm: Normal rate and regular rhythm.     Pulses: Normal pulses.     Heart sounds: Normal heart sounds.  Pulmonary:     Effort: Pulmonary effort is normal.     Breath sounds: Normal breath sounds.  Skin:    General: Skin is warm and dry.  Neurological:     General: No focal deficit present.     Mental Status: She is alert and oriented to person, place, and time.  Psychiatric:        Mood and Affect: Mood normal.        Behavior: Behavior normal.        Judgment: Judgment normal.        PHQ9 SCORE ONLY 02/27/2020 07/20/2019 06/19/2019  PHQ-9 Total Score 9 0 0     Assessment and Plan   1. Fatigue, unspecified type   2. History of anemia   3. Vitamin D deficiency   4. Hyperlipidemia, unspecified hyperlipidemia type   5. Arthralgia, unspecified joint   6. Nose congested      Plan: 1., 5.  We will check blood work for further evaluation today we will also refer to have sleep study.  PHQ-9 is somewhat positive for depression but she has not been having any decreased interest or pleasure, so I think this might be less likely as etiology for her symptoms. 2.  We will check vitamin B12 level. 3.  We will check a vitamin D3 level. 4.  We will repeat lipid panel today. 6.  No foreign substance or abnormal skin tag or mass noted on exam.  For now I recommend she monitor her nose and if her symptoms persist, or worsen especially she starts experiencing bleeding, noticeable asymmetry of her nose, difficulty breathing through her nose etc. she should let me know.  She tells me she understands.   Tests ordered Orders Placed This Encounter  Procedures   Vitamin B12   TSH   T3, Free   T4, Free   Vitamin D, 25-hydroxy   CMP with eGFR([facility])   Lipid Panel   ANA   Rheumatoid Factor   Ambulatory referral to Neurology      No orders of the defined types were  placed in this encounter.   Patient to follow-up in 3 months or  sooner as needed.  Ailene Ards, NP

## 2020-02-29 ENCOUNTER — Other Ambulatory Visit (INDEPENDENT_AMBULATORY_CARE_PROVIDER_SITE_OTHER): Payer: Self-pay | Admitting: Nurse Practitioner

## 2020-02-29 ENCOUNTER — Telehealth (INDEPENDENT_AMBULATORY_CARE_PROVIDER_SITE_OTHER): Payer: Medicare Other | Admitting: Nurse Practitioner

## 2020-02-29 DIAGNOSIS — E559 Vitamin D deficiency, unspecified: Secondary | ICD-10-CM

## 2020-02-29 DIAGNOSIS — R5383 Other fatigue: Secondary | ICD-10-CM

## 2020-02-29 DIAGNOSIS — K219 Gastro-esophageal reflux disease without esophagitis: Secondary | ICD-10-CM

## 2020-02-29 MED ORDER — VITAMIN D 125 MCG (5000 UT) PO CAPS: 1.0000 | ORAL_CAPSULE | ORAL | 1 refills | Status: DC

## 2020-02-29 NOTE — Progress Notes (Addendum)
I called this patient to discuss her blood work in detail.  I had originally planned on discussing starting her on statin therapy to treat her hyperlipidemia, however after playing in demographics and risk factors into the ASCVD risk estimator by the [employer] of cardiology, her 10-year ASCVD risk score is only 1.4%.  Thus risk of statin therapy may outweigh benefit at this time.  I reminded her to focus on healthy lifestyle.  She is also very concerned regarding her fatigue, and I reminded her that we have done quite a thorough investigation into her fatigue thus far and we have ruled out valvular or structural defects in the heart, kidney dysfunction, liver dysfunction, anemia, thyroid dysfunction, vitamin D or vitamin B deficiencies, and so far the autoimmune labs that we have checked of been negative.  Also depression score was fairly low and only was positive due to fatigue, not anhedonia.  I did recommend that she follow-up with sleep specialist to undergo sleep study.  Also went over blood work results with her and recommended she take her vitamin D3 supplement every other day as opposed to daily.  She tells me she understands.  She will follow-up as scheduled with [place] in 3 months, or sooner as needed.

## 2020-03-01 LAB — COMPLETE METABOLIC PANEL WITH GFR
AG Ratio: 1.5 (calc) (ref 1.0–2.5)
ALT: 25 U/L (ref 6–29)
AST: 31 U/L (ref 10–35)
Albumin: 4.7 g/dL (ref 3.6–5.1)
Alkaline phosphatase (APISO): 81 U/L (ref 37–153)
BUN: 9 mg/dL (ref 7–25)
CO2: 27 mmol/L (ref 20–32)
Calcium: 9.7 mg/dL (ref 8.6–10.4)
Chloride: 102 mmol/L (ref 98–110)
Creat: 0.83 mg/dL (ref 0.50–1.05)
GFR, Est African American: 91 mL/min/{1.73_m2} (ref >=60)
GFR, Est Non African American: 79 mL/min/{1.73_m2} (ref >=60)
Globulin: 3.1 g/dL (calc) (ref 1.9–3.7)
Glucose, Bld: 98 mg/dL (ref 65–99)
Potassium: 4.9 mmol/L (ref 3.5–5.3)
Sodium: 139 mmol/L (ref 135–146)
Total Bilirubin: 0.3 mg/dL (ref 0.2–1.2)
Total Protein: 7.8 g/dL (ref 6.1–8.1)

## 2020-03-01 LAB — VITAMIN B12: Vitamin B-12: >2000 pg/mL — ABNORMAL HIGH (ref 200–1100)

## 2020-03-01 LAB — LIPID PANEL
Cholesterol: 224 mg/dL — ABNORMAL HIGH (ref <200)
HDL: 73 mg/dL (ref >=50)
LDL Cholesterol (Calc): 135 mg/dL (calc) — ABNORMAL HIGH
Non-HDL Cholesterol (Calc): 151 mg/dL (calc) — ABNORMAL HIGH (ref <130)
Total CHOL/HDL Ratio: 3.1 (calc) (ref <5.0)
Triglycerides: 72 mg/dL (ref <150)

## 2020-03-01 LAB — T4, FREE: Free T4: 1.1 ng/dL (ref 0.8–1.8)

## 2020-03-01 LAB — ANA: Anti Nuclear Antibody (ANA): NEGATIVE

## 2020-03-01 LAB — T3, FREE: T3, Free: 3.2 pg/mL (ref 2.3–4.2)

## 2020-03-01 LAB — RHEUMATOID FACTOR: Rheumatoid fact SerPl-aCnc: <14 IU/mL (ref <14)

## 2020-03-01 LAB — VITAMIN D 25 HYDROXY (VIT D DEFICIENCY, FRACTURES): Vit D, 25-Hydroxy: 104 ng/mL — ABNORMAL HIGH (ref 30–100)

## 2020-03-01 LAB — TSH: TSH: 2.66 mIU/L (ref 0.40–4.50)

## 2020-03-13 ENCOUNTER — Telehealth (INDEPENDENT_AMBULATORY_CARE_PROVIDER_SITE_OTHER): Payer: Self-pay

## 2020-03-13 NOTE — Telephone Encounter (Signed)
Patient called and left a VM asking if she was on any thyroid medication?   I called the patient back and left a voice message to let her know that I do not see a message in her lab results or any thyroid medication listed on her medication list.

## 2020-03-27 DIAGNOSIS — G4733 Obstructive sleep apnea (adult) (pediatric): Secondary | ICD-10-CM | POA: Diagnosis not present

## 2020-03-27 DIAGNOSIS — K2100 Gastro-esophageal reflux disease with esophagitis, without bleeding: Secondary | ICD-10-CM | POA: Diagnosis not present

## 2020-03-27 DIAGNOSIS — R5382 Chronic fatigue, unspecified: Secondary | ICD-10-CM | POA: Diagnosis not present

## 2020-04-09 ENCOUNTER — Other Ambulatory Visit (HOSPITAL_BASED_OUTPATIENT_CLINIC_OR_DEPARTMENT_OTHER): Payer: Self-pay

## 2020-04-09 DIAGNOSIS — G4733 Obstructive sleep apnea (adult) (pediatric): Secondary | ICD-10-CM

## 2020-05-29 ENCOUNTER — Ambulatory Visit (INDEPENDENT_AMBULATORY_CARE_PROVIDER_SITE_OTHER): Payer: Medicare Other | Admitting: Internal Medicine

## 2020-06-20 ENCOUNTER — Ambulatory Visit (INDEPENDENT_AMBULATORY_CARE_PROVIDER_SITE_OTHER): Payer: Medicare Other | Admitting: Internal Medicine

## 2020-07-01 ENCOUNTER — Other Ambulatory Visit (INDEPENDENT_AMBULATORY_CARE_PROVIDER_SITE_OTHER): Payer: Self-pay | Admitting: Internal Medicine

## 2020-07-01 ENCOUNTER — Telehealth (INDEPENDENT_AMBULATORY_CARE_PROVIDER_SITE_OTHER): Payer: Self-pay | Admitting: Internal Medicine

## 2020-07-01 MED ORDER — OMEPRAZOLE 40 MG PO CPDR: 40.0000 mg | DELAYED_RELEASE_CAPSULE | Freq: Every day | ORAL | 3 refills | Status: DC

## 2020-07-01 NOTE — Telephone Encounter (Signed)
Done

## 2020-07-09 ENCOUNTER — Encounter (INDEPENDENT_AMBULATORY_CARE_PROVIDER_SITE_OTHER): Payer: Self-pay | Admitting: Internal Medicine

## 2020-07-09 ENCOUNTER — Other Ambulatory Visit: Payer: Self-pay

## 2020-07-09 ENCOUNTER — Ambulatory Visit (INDEPENDENT_AMBULATORY_CARE_PROVIDER_SITE_OTHER): Payer: Medicare Other | Admitting: Internal Medicine

## 2020-07-09 VITALS — BP 100/62 | HR 79 | Temp 97.7°F | Ht 67.0 in | Wt 125.6 lb

## 2020-07-09 DIAGNOSIS — R5383 Other fatigue: Secondary | ICD-10-CM | POA: Diagnosis not present

## 2020-07-09 DIAGNOSIS — E559 Vitamin D deficiency, unspecified: Secondary | ICD-10-CM | POA: Diagnosis not present

## 2020-07-09 LAB — CBC
HCT: 35.5 % (ref 35.0–45.0)
Hemoglobin: 12.3 g/dL (ref 11.7–15.5)
MCH: 30.7 pg (ref 27.0–33.0)
MCHC: 34.6 g/dL (ref 32.0–36.0)
MCV: 88.5 fL (ref 80.0–100.0)
MPV: 12.6 fL — ABNORMAL HIGH (ref 7.5–12.5)
Platelets: 263 10*3/uL (ref 140–400)
RBC: 4.01 10*6/uL (ref 3.80–5.10)
RDW: 12.9 % (ref 11.0–15.0)
WBC: 7.6 10*3/uL (ref 3.8–10.8)

## 2020-07-09 NOTE — Progress Notes (Signed)
Metrics: Intervention Frequency ACO  Documented Smoking Status Yearly  Screened one or more times in 24 months  Cessation Counseling or  Active cessation medication Past 24 months  Past 24 months   Guideline developer: UpToDate (See UpToDate for funding source) Date Released: 2014       Wellness Office Visit  Subjective:  Patient ID: Shannon Castaneda, female    DOB: 1966/01/05  Age: 55 y.o. MRN: [id number]  CC: Fatigue. HPI The patient describes fatigue still.  She had a thorough work-up with [name] on the last visit and there were no major abnormalities.  She did have a CBC which did not show anemia about 1 year ago. However, despite her perception of fatigue, she is able to function without any problems on a daily basis.  Past Medical History:  Diagnosis Date  . Blood dyscrasia    "free bleeder "  . Degeneration of lumbar or lumbosacral intervertebral disc 03/17/2016  . GERD (gastroesophageal reflux disease)   . Hyperlipidemia   . Osteoporosis   . Vitamin D deficiency    Past Surgical History:  Procedure Laterality Date  . MULTIPLE EXTRACTIONS WITH ALVEOLOPLASTY  07/27/2011   Procedure: MULTIPLE EXTRACION WITH ALVEOLOPLASTY;  Surgeon: Georgia Lopes, DDS;  Location: [facility] OR;  Service: Oral Surgery;  Laterality: Bilateral;  Insertion of denture.     Family History  Problem Relation Age of Onset  . Heart disease Mother   . Cancer Brother        lung    Social History   Social History Narrative   Lives with daughter age 58, granddaughter   Disabled due to "slow learner"   Social History   Tobacco Use  . Smoking status: Never Smoker  . Smokeless tobacco: Never Used  Substance Use Topics  . Alcohol use: No    Current Meds  Medication Sig  . Calcium Carbonate-Vitamin D (CALTRATE 600+D PO) Take 1 tablet by mouth.  . Cholecalciferol (VITAMIN D) 125 MCG (5000 UT) CAPS Take 1 capsule by mouth every other day. (Patient taking differently: Take 2 capsules by mouth every  other day.)  . omeprazole (PRILOSEC) 40 MG capsule Take 1 capsule (40 mg total) by mouth daily.  Marland Kitchen triamcinolone (KENALOG) 0.025 % ointment triamcinolone acetonide 0.025 % topical ointment  APPLY TOPICALLY TO THE AFFECTED AREA TWICE DAILY     Flowsheet Row Office Visit from 07/09/2020 in [facility]  PHQ-9 Total Score 0      Objective:   Today's Vitals: BP 100/62   Pulse 79   Temp 97.7 F (36.5 C) (Temporal)   Ht 5\' 7"  (1.702 m)   Wt 125 lb 9.6 oz (57 kg)   LMP 10/27/2010   SpO2 99%   BMI 19.67 kg/m  Vitals with BMI 07/09/2020 02/27/2020 11/02/2019  Height 5\' 7"  5\' 7"  5\' 7"   Weight 125 lbs 10 oz 122 lbs 10 oz 124 lbs  BMI 19.67 19.2 19.42  Systolic 100 108 11/04/2019  Diastolic 62 68 70  Pulse 79 74 77     Physical Exam  She looks systemically well.  She is not losing any weight.  Blood pressure is excellent.     Assessment   1. Fatigue, unspecified type   2. Vitamin D deficiency       Tests ordered Orders Placed This Encounter  Procedures  . CBC     Plan: 1. We will check a CBC just to make sure she is not anemic although I doubt this  very much. 2. She will continue to focus on remaining active. 3. I have urged her to get a screening mammogram done as she has not had one for approximately 4 years but she is hesitant. 4. Follow-up with [name] in about a month's time for an annual Medicare wellness visit.   No orders of the defined types were placed in this encounter.   Wilson Singer, MD

## 2020-08-08 ENCOUNTER — Ambulatory Visit (INDEPENDENT_AMBULATORY_CARE_PROVIDER_SITE_OTHER): Payer: Medicare Other | Admitting: Nurse Practitioner

## 2020-08-13 DIAGNOSIS — H5213 Myopia, bilateral: Secondary | ICD-10-CM | POA: Diagnosis not present

## 2020-09-03 DIAGNOSIS — H5203 Hypermetropia, bilateral: Secondary | ICD-10-CM | POA: Diagnosis not present

## 2020-09-03 DIAGNOSIS — H52223 Regular astigmatism, bilateral: Secondary | ICD-10-CM | POA: Diagnosis not present

## 2020-09-19 ENCOUNTER — Ambulatory Visit (INDEPENDENT_AMBULATORY_CARE_PROVIDER_SITE_OTHER): Payer: Medicare Other | Admitting: Nurse Practitioner

## 2020-09-27 NOTE — Progress Notes (Unsigned)
Subjective:   Shannon Castaneda is a 55 y.o. female who presents for an Initial Medicare Annual Wellness Visit.  Review of Systems    N/A        Objective:    There were no vitals filed for this visit. There is no height or weight on file to calculate BMI.  Advanced Directives 07/23/2011  Does Patient Have a Medical Advance Directive? Patient would not like information;Patient does not have advance directive    Current Medications (verified) Outpatient Encounter Medications as of 09/30/2020  Medication Sig  . Calcium Carbonate-Vitamin D (CALTRATE 600+D PO) Take 1 tablet by mouth.  . Cholecalciferol (VITAMIN D) 125 MCG (5000 UT) CAPS Take 1 capsule by mouth every other day. (Patient taking differently: Take 2 capsules by mouth every other day.)  . omeprazole (PRILOSEC) 40 MG capsule Take 1 capsule (40 mg total) by mouth daily.  Marland Kitchen triamcinolone (KENALOG) 0.025 % ointment triamcinolone acetonide 0.025 % topical ointment  APPLY TOPICALLY TO THE AFFECTED AREA TWICE DAILY   No facility-administered encounter medications on file as of 09/30/2020.    Allergies (verified) Patient has no known allergies.   History: Past Medical History:  Diagnosis Date  . Blood dyscrasia    "free bleeder "  . Degeneration of lumbar or lumbosacral intervertebral disc 03/17/2016  . GERD (gastroesophageal reflux disease)   . Hyperlipidemia   . Osteoporosis   . Vitamin D deficiency    Past Surgical History:  Procedure Laterality Date  . MULTIPLE EXTRACTIONS WITH ALVEOLOPLASTY  07/27/2011   Procedure: MULTIPLE EXTRACION WITH ALVEOLOPLASTY;  Surgeon: Georgia Lopes, DDS;  Location: [facility] OR;  Service: Oral Surgery;  Laterality: Bilateral;  Insertion of denture.   Family History  Problem Relation Age of Onset  . Heart disease Mother   . Cancer Brother        lung   Social History   Socioeconomic History  . Marital status: Single    Spouse name: Not on file  . Number of children: 1  . Years of  education: 71  . Highest education level: Not on file  Occupational History    Comment: disability  Tobacco Use  . Smoking status: Never Smoker  . Smokeless tobacco: Never Used  Vaping Use  . Vaping Use: Never used  Substance and Sexual Activity  . Alcohol use: No  . Drug use: No  . Sexual activity: Yes    Birth control/protection: Pill  Other Topics Concern  . Not on file  Social History Narrative   Lives with daughter age 63, granddaughter   Disabled due to "slow learner"   Social Determinants of Health   Financial Resource Strain: Not on file  Food Insecurity: Not on file  Transportation Needs: Not on file  Physical Activity: Not on file  Stress: Not on file  Social Connections: Not on file    Tobacco Counseling Counseling given: Not Answered   Clinical Intake:                 Diabetic?No2         Activities of Daily Living No flowsheet data found.  Patient Care Team: [name], NP as PCP - General (Nurse Practitioner)  Indicate any recent Medical Services you may have received from other than [facility] providers in the past year (date may be approximate).     Assessment:   This is a routine wellness examination for Shannon Castaneda.  Hearing/Vision screen No exam data present  Dietary issues and  exercise activities discussed:    Goals Addressed   None    Depression Screen PHQ 2/9 Scores 07/09/2020 02/27/2020 07/20/2019 06/19/2019 06/19/2019 02/16/2017 07/31/2016  PHQ - 2 Score 0 0 0 0 0 0 0  PHQ- 9 Score 0 9 - - - - -  Exception Documentation - - Medical reason - - - -    Fall Risk Fall Risk  07/20/2019 06/19/2019 07/31/2016 05/19/2016 03/17/2016  Falls in the past year? 0 0 No No No  Number falls in past yr: 0 0 - - -  Injury with Fall? 0 0 - - -  Risk for fall due to : No Fall Risks - - - -  Follow up Falls evaluation completed - - - -    FALL RISK PREVENTION PERTAINING TO THE HOME:  Any stairs in or around the home? {YES/NO:21197} If so, are  there any without handrails? No  Home free of loose throw rugs in walkways, pet beds, electrical cords, etc? Yes  Adequate lighting in your home to reduce risk of falls? Yes   ASSISTIVE DEVICES UTILIZED TO PREVENT FALLS:  Life alert? {YES/NO:21197} Use of a cane, walker or w/c? {YES/NO:21197} Grab bars in the bathroom? {YES/NO:21197} Shower chair or bench in shower? {YES/NO:21197} Elevated toilet seat or a handicapped toilet? {YES/NO:21197}  TIMED UP AND GO:  Was the test performed? Yes .  Length of time to ambulate 10 feet: *** sec.   {Appearance of EHUD:1497026}  Cognitive Function:        Immunizations Immunization History  Administered Date(s) Administered  . Influenza-Unspecified 01/16/2016  . PFIZER(Purple Top)SARS-COV-2 Vaccination 01/04/2020, 01/25/2020    TDAP status: Due, Education has been provided regarding the importance of this vaccine. Advised may receive this vaccine at local pharmacy or Health Dept. Aware to provide a copy of the vaccination record if obtained from local pharmacy or Health Dept. Verbalized acceptance and understanding.  {Flu Vaccine status:2101806}  Pneumococcal vaccine status: Up to date  Covid-19 vaccine status: Completed vaccines  Qualifies for Shingles Vaccine? Yes   Zostavax completed No   Shingrix Completed?: Yes  Screening Tests Health Maintenance  Topic Date Due  . TETANUS/TDAP  Never done  . PAP SMEAR-Modifier  Never done  . COLONOSCOPY (Pts 45-38yrs Insurance coverage will need to be confirmed)  Never done  . COVID-19 Vaccine (3 - Booster for Pfizer series) 06/26/2020  . MAMMOGRAM  07/09/2021 (Originally 06/01/2018)  . INFLUENZA VACCINE  12/16/2020  . Hepatitis C Screening  Completed  . HIV Screening  Completed  . HPV VACCINES  Aged Out    Health Maintenance  Health Maintenance Due  Topic Date Due  . TETANUS/TDAP  Never done  . PAP SMEAR-Modifier  Never done  . COLONOSCOPY (Pts 45-36yrs Insurance coverage will  need to be confirmed)  Never done  . COVID-19 Vaccine (3 - Booster for Pfizer series) 06/26/2020    {Colorectal cancer screening:2101809}  Mammogram status: Ordered 09/30/2020. Pt provided with contact info and advised to call to schedule appt.   Bone Density status: Completed 06/29/2019. Results reflect: Bone density results: OSTEOPENIA. Repeat every 5 years.  Lung Cancer Screening: (Low Dose CT Chest recommended if Age 41-80 years, 30 pack-year currently smoking OR have quit w/in 15years.) does not qualify.   Lung Cancer Screening Referral: N/A   Additional Screening:  Hepatitis C Screening: does qualify; Completed 05/19/2016  Vision Screening: Recommended annual ophthalmology exams for early detection of glaucoma and other disorders of the eye. Is the patient up  to date with their annual eye exam?  {YES/NO:21197} Who is the provider or what is the name of the office in which the patient attends annual eye exams? *** If pt is not established with a provider, would they like to be referred to a provider to establish care? {YES/NO:21197}.   Dental Screening: Recommended annual dental exams for proper oral hygiene  Community Resource Referral / Chronic Care Management: CRR required this visit?  No   CCM required this visit?  No      Plan:     I have personally reviewed and noted the following in the patient's chart:   . Medical and social history . Use of alcohol, tobacco or illicit drugs  . Current medications and supplements including opioid prescriptions. Patient is not currently taking opioid prescriptions. . Functional ability and status . Nutritional status . Physical activity . Advanced directives . List of other physicians . Hospitalizations, surgeries, and ER visits in previous 12 months . Vitals . Screenings to include cognitive, depression, and falls . Referrals and appointments  In addition, I have reviewed and discussed with patient certain preventive  protocols, quality metrics, and best practice recommendations. A written personalized care plan for preventive services as well as general preventive health recommendations were provided to patient.     Theodora Blow, LPN   11/18/5007   Nurse Notes: None

## 2020-09-30 ENCOUNTER — Ambulatory Visit (INDEPENDENT_AMBULATORY_CARE_PROVIDER_SITE_OTHER): Payer: Medicare Other

## 2020-11-04 ENCOUNTER — Ambulatory Visit (INDEPENDENT_AMBULATORY_CARE_PROVIDER_SITE_OTHER): Payer: Medicare Other

## 2020-11-14 ENCOUNTER — Other Ambulatory Visit (INDEPENDENT_AMBULATORY_CARE_PROVIDER_SITE_OTHER): Payer: Self-pay | Admitting: Internal Medicine

## 2020-12-23 ENCOUNTER — Ambulatory Visit: Payer: Medicare Other | Admitting: Family Medicine

## 2021-01-09 ENCOUNTER — Ambulatory Visit: Payer: Medicare Other | Admitting: Family Medicine

## 2021-01-13 ENCOUNTER — Ambulatory Visit: Payer: Medicare Other | Admitting: Family Medicine

## 2021-01-30 ENCOUNTER — Encounter: Payer: Self-pay | Admitting: Family Medicine

## 2021-01-30 ENCOUNTER — Ambulatory Visit (INDEPENDENT_AMBULATORY_CARE_PROVIDER_SITE_OTHER): Payer: Medicare Other | Admitting: Family Medicine

## 2021-01-30 ENCOUNTER — Other Ambulatory Visit: Payer: Self-pay | Admitting: Family Medicine

## 2021-01-30 ENCOUNTER — Other Ambulatory Visit: Payer: Self-pay

## 2021-01-30 VITALS — BP 139/74 | HR 74 | Temp 97.3°F | Ht 67.0 in | Wt 121.4 lb

## 2021-01-30 DIAGNOSIS — R5383 Other fatigue: Secondary | ICD-10-CM

## 2021-01-30 DIAGNOSIS — E785 Hyperlipidemia, unspecified: Secondary | ICD-10-CM

## 2021-01-30 DIAGNOSIS — K219 Gastro-esophageal reflux disease without esophagitis: Secondary | ICD-10-CM

## 2021-01-30 DIAGNOSIS — Z23 Encounter for immunization: Secondary | ICD-10-CM | POA: Diagnosis not present

## 2021-01-30 DIAGNOSIS — E559 Vitamin D deficiency, unspecified: Secondary | ICD-10-CM

## 2021-01-30 LAB — URINALYSIS
Bilirubin, UA: NEGATIVE
Glucose, UA: NEGATIVE
Ketones, UA: NEGATIVE
Leukocytes,UA: NEGATIVE
Nitrite, UA: NEGATIVE
Protein,UA: NEGATIVE
RBC, UA: NEGATIVE
Specific Gravity, UA: 1.015 (ref 1.005–1.030)
Urobilinogen, Ur: 0.2 mg/dL (ref 0.2–1.0)
pH, UA: 7.5 (ref 5.0–7.5)

## 2021-01-30 MED ORDER — RABEPRAZOLE SODIUM 20 MG PO TBEC: 20.0000 mg | DELAYED_RELEASE_TABLET | Freq: Every day | ORAL | 1 refills | Status: DC

## 2021-01-30 MED ORDER — CYANOCOBALAMIN 1000 MCG/ML IJ SOLN
1000.0000 ug | Freq: Once | INTRAMUSCULAR | Status: AC
Start: 2021-01-30 — End: 2021-01-30
  Administered 2021-01-30: 1000 ug via INTRAMUSCULAR

## 2021-01-30 MED ORDER — TRAZODONE HCL 100 MG PO TABS: 100.0000 mg | ORAL_TABLET | Freq: Every day | ORAL | 2 refills | Status: DC

## 2021-01-30 MED ORDER — RABEPRAZOLE SODIUM 20 MG PO TBEC: DELAYED_RELEASE_TABLET | ORAL | 0 refills | Status: DC

## 2021-01-30 NOTE — Addendum Note (Signed)
Addended by: Julious Payer D on: 01/30/2021 02:47 PM   Modules accepted: Orders

## 2021-01-30 NOTE — Patient Instructions (Signed)
[facility]  Shannon Castaneda [place], [place]

## 2021-01-30 NOTE — Progress Notes (Signed)
Established Patient Office Visit  Subjective:  Patient ID: Shannon Castaneda, female    DOB: 1965-11-29  Age: 55 y.o. MRN: [id number]  CC:  Chief Complaint  Patient presents with   New Patient (Initial Visit)    HPI Shannon Castaneda presents for fatigue daily. Feels tired all the time. Awakens every hour all night. No need to go to the bathroom Not anxious. Denies depression.  Acid reflux persists in spite of use of Prilosec. Taking D 3, 10 K u per day and 2 Vit B12 as well daily. Can walk 30 minutes before  getting winded.   Had nml Echo July, 2021. Notes from Dr. Marian Sorrow & staff reviewed. Multiple labs 11 mos ago reviewed.   Past Medical History:  Diagnosis Date   Blood dyscrasia    "free bleeder "   Degeneration of lumbar or lumbosacral intervertebral disc 03/17/2016   GERD (gastroesophageal reflux disease)    Hyperlipidemia    Osteoporosis    Vitamin D deficiency     Past Surgical History:  Procedure Laterality Date   MULTIPLE EXTRACTIONS WITH ALVEOLOPLASTY  07/27/2011   Procedure: MULTIPLE EXTRACION WITH ALVEOLOPLASTY;  Surgeon: Georgia Lopes, DDS;  Location: [facility] OR;  Service: Oral Surgery;  Laterality: Bilateral;  Insertion of denture.    Family History  Problem Relation Age of Onset   Heart disease Mother    Cancer Brother        lung    Social History   Socioeconomic History   Marital status: Single    Spouse name: Not on file   Number of children: 1   Years of education: 12   Highest education level: Not on file  Occupational History    Comment: disability  Tobacco Use   Smoking status: Never   Smokeless tobacco: Never  Vaping Use   Vaping Use: Never used  Substance and Sexual Activity   Alcohol use: No   Drug use: No   Sexual activity: Yes    Birth control/protection: Pill  Other Topics Concern   Not on file  Social History Narrative   Lives with daughter age 71, granddaughter   Disabled due to "slow learner"   Social Determinants of  Health   Financial Resource Strain: Not on file  Food Insecurity: Not on file  Transportation Needs: Not on file  Physical Activity: Not on file  Stress: Not on file  Social Connections: Not on file  Intimate Partner Violence: Not on file    Outpatient Medications Prior to Visit  Medication Sig Dispense Refill   Calcium Carbonate-Vitamin D (CALTRATE 600+D PO) Take 1 tablet by mouth.     Cholecalciferol (VITAMIN D) 125 MCG (5000 UT) CAPS Take 1 capsule by mouth every other day. (Patient taking differently: Take 2 capsules by mouth every other day.) 90 capsule 1   triamcinolone (KENALOG) 0.025 % ointment triamcinolone acetonide 0.025 % topical ointment  APPLY TOPICALLY TO THE AFFECTED AREA TWICE DAILY     omeprazole (PRILOSEC) 40 MG capsule TAKE 1 CAPSULE(40 MG) BY MOUTH DAILY 30 capsule 3   No facility-administered medications prior to visit.    No Known Allergies  ROS Review of Systems  Constitutional: Negative.   HENT:  Negative for congestion.   Eyes:  Negative for visual disturbance.  Respiratory:  Negative for shortness of breath.   Cardiovascular:  Negative for chest pain.  Gastrointestinal:  Negative for abdominal pain, constipation, diarrhea, nausea and vomiting.  Genitourinary:  Negative for difficulty urinating.  Musculoskeletal:  Negative for arthralgias and myalgias.  Neurological:  Negative for headaches.  Psychiatric/Behavioral:  Negative for sleep disturbance.      Objective:    Physical Exam Constitutional:      General: She is not in acute distress.    Appearance: She is well-developed.  HENT:     Head: Normocephalic and atraumatic.     Right Ear: External ear normal.     Left Ear: External ear normal.  Cardiovascular:     Rate and Rhythm: Normal rate and regular rhythm.     Heart sounds: Normal heart sounds. No murmur heard. Pulmonary:     Effort: Pulmonary effort is normal. No respiratory distress.     Breath sounds: Normal breath sounds. No  wheezing or rales.  Abdominal:     Palpations: Abdomen is soft.     Tenderness: There is no abdominal tenderness.  Musculoskeletal:        General: No tenderness. Normal range of motion.     Cervical back: Normal range of motion and neck supple.  Skin:    General: Skin is warm and dry.  Neurological:     Mental Status: She is alert and oriented to person, place, and time.     Motor: No abnormal muscle tone.     Coordination: Coordination normal.  Psychiatric:        Behavior: Behavior normal.    BP 139/74   Pulse 74   Temp (!) 97.3 F (36.3 C)   Ht 5\' 7"  (1.702 m)   Wt 121 lb 6.4 oz (55.1 kg)   LMP 10/27/2010   SpO2 98%   BMI 19.01 kg/m  Wt Readings from Last 3 Encounters:  01/30/21 121 lb 6.4 oz (55.1 kg)  07/09/20 125 lb 9.6 oz (57 kg)  02/27/20 122 lb 9.6 oz (55.6 kg)     Health Maintenance Due  Topic Date Due   TETANUS/TDAP  Never done   PAP SMEAR-Modifier  Never done   COLONOSCOPY (Pts 45-77yrs Insurance coverage will need to be confirmed)  Never done   COVID-19 Vaccine (3 - Pfizer risk series) 02/22/2020    There are no preventive care reminders to display for this patient.  Lab Results  Component Value Date   TSH 2.66 02/27/2020   Lab Results  Component Value Date   WBC 7.9 01/30/2021   HGB 12.6 01/30/2021   HCT 37.0 01/30/2021   MCV 91 01/30/2021   PLT 281 01/30/2021   Lab Results  Component Value Date   NA 141 01/30/2021   K 4.7 01/30/2021   CO2 24 01/30/2021   GLUCOSE 99 01/30/2021   BUN 12 01/30/2021   CREATININE 0.77 01/30/2021   BILITOT <0.2 01/30/2021   ALKPHOS 111 01/30/2021   AST 26 01/30/2021   ALT 23 01/30/2021   PROT 7.7 01/30/2021   ALBUMIN 4.8 01/30/2021   CALCIUM 9.7 01/30/2021   EGFR 90 01/30/2021   Lab Results  Component Value Date   CHOL 263 (H) 01/30/2021   Lab Results  Component Value Date   HDL 67 01/30/2021   Lab Results  Component Value Date   LDLCALC 179 (H) 01/30/2021   Lab Results  Component Value  Date   TRIG 98 01/30/2021   Lab Results  Component Value Date   CHOLHDL 3.9 01/30/2021   No results found for: HGBA1C    Assessment & Plan:   Problem List Items Addressed This Visit       Active Problems  Fatigue - Primary   Relevant Orders   CBC with Differential/Platelet (Completed)   CMP14+EGFR (Completed)   Lipid panel (Completed)   Urinalysis (Completed)   VITAMIN D 25 Hydroxy (Vit-D Deficiency, Fractures) (Completed)   HIV Antibody (routine testing w rflx) (Completed)   Lyme Disease Serology w/Reflex (Completed)   Cortisol-am, blood (Completed)   Sedimentation Rate (Completed)   EKG 12-Lead (Completed)   ANA,IFA RA Diag Pnl w/rflx Tit/Patn (Completed)   Systemic Lupus Profile A (Completed)   Gastroesophageal reflux disease   Relevant Orders   CBC with Differential/Platelet (Completed)   CMP14+EGFR (Completed)   HLD (hyperlipidemia)   Relevant Orders   Lipid panel (Completed)   Vitamin D deficiency   Relevant Orders   VITAMIN D 25 Hydroxy (Vit-D Deficiency, Fractures) (Completed)   Other Visit Diagnoses     Need for shingles vaccine       Relevant Orders   Varicella-zoster vaccine IM (Shingrix) (Completed)   Need for immunization against influenza       Relevant Orders   Flu Vaccine QUAD 10mo+IM (Fluarix, Fluzone & Alfiuria Quad PF) (Completed)       Meds ordered this encounter  Medications   traZODone (DESYREL) 100 MG tablet    Sig: Take 1 tablet (100 mg total) by mouth at bedtime. as needed for sleep.    Dispense:  30 tablet    Refill:  2   DISCONTD: RABEprazole (ACIPHEX) 20 MG tablet    Sig: Take 1 tablet (20 mg total) by mouth daily. For reflux    Dispense:  30 tablet    Refill:  1   cyanocobalamin ((VITAMIN B-12)) injection 1,000 mcg    Follow-up: Return in about 1 month (around 03/01/2021).    Claretta Fraise, MD

## 2021-02-03 ENCOUNTER — Encounter: Payer: Self-pay | Admitting: *Deleted

## 2021-02-04 LAB — CMP14+EGFR
ALT: 23 IU/L (ref 0–32)
AST: 26 IU/L (ref 0–40)
Albumin/Globulin Ratio: 1.7 (ref 1.2–2.2)
Albumin: 4.8 g/dL (ref 3.8–4.9)
Alkaline Phosphatase: 111 IU/L (ref 44–121)
BUN/Creatinine Ratio: 16 (ref 9–23)
BUN: 12 mg/dL (ref 6–24)
Bilirubin Total: <0.2 mg/dL (ref 0.0–1.2)
CO2: 24 mmol/L (ref 20–29)
Calcium: 9.7 mg/dL (ref 8.7–10.2)
Chloride: 100 mmol/L (ref 96–106)
Creatinine, Ser: 0.77 mg/dL (ref 0.57–1.00)
Globulin, Total: 2.9 g/dL (ref 1.5–4.5)
Glucose: 99 mg/dL (ref 65–99)
Potassium: 4.7 mmol/L (ref 3.5–5.2)
Sodium: 141 mmol/L (ref 134–144)
Total Protein: 7.7 g/dL (ref 6.0–8.5)
eGFR: 90 mL/min/{1.73_m2} (ref >59)

## 2021-02-04 LAB — CBC WITH DIFFERENTIAL/PLATELET
Basophils Absolute: 0 10*3/uL (ref 0.0–0.2)
Basos: 1 %
EOS (ABSOLUTE): 0.2 10*3/uL (ref 0.0–0.4)
Eos: 3 %
Hematocrit: 37 % (ref 34.0–46.6)
Hemoglobin: 12.6 g/dL (ref 11.1–15.9)
Immature Grans (Abs): 0 10*3/uL (ref 0.0–0.1)
Immature Granulocytes: 0 %
Lymphocytes Absolute: 2.7 10*3/uL (ref 0.7–3.1)
Lymphs: 34 %
MCH: 30.9 pg (ref 26.6–33.0)
MCHC: 34.1 g/dL (ref 31.5–35.7)
MCV: 91 fL (ref 79–97)
Monocytes Absolute: 0.5 10*3/uL (ref 0.1–0.9)
Monocytes: 7 %
Neutrophils Absolute: 4.4 10*3/uL (ref 1.4–7.0)
Neutrophils: 55 %
Platelets: 281 10*3/uL (ref 150–450)
RBC: 4.08 x10E6/uL (ref 3.77–5.28)
RDW: 13.4 % (ref 11.7–15.4)
WBC: 7.9 10*3/uL (ref 3.4–10.8)

## 2021-02-04 LAB — SYSTEMIC LUPUS PROFILE A
Chromatin Ab SerPl-aCnc: <0.2 AI (ref 0.0–0.9)
ENA RNP Ab: <0.2 AI (ref 0.0–0.9)
ENA SM Ab Ser-aCnc: <0.2 AI (ref 0.0–0.9)
ENA SSA (RO) Ab: <0.2 AI (ref 0.0–0.9)
ENA SSB (LA) Ab: <0.2 AI (ref 0.0–0.9)
Rheumatoid fact SerPl-aCnc: <10 IU/mL (ref <14.0)
dsDNA Ab: 1 IU/mL (ref 0–9)

## 2021-02-04 LAB — LIPID PANEL
Chol/HDL Ratio: 3.9 ratio (ref 0.0–4.4)
Cholesterol, Total: 263 mg/dL — ABNORMAL HIGH (ref 100–199)
HDL: 67 mg/dL (ref >39)
LDL Chol Calc (NIH): 179 mg/dL — ABNORMAL HIGH (ref 0–99)
Triglycerides: 98 mg/dL (ref 0–149)
VLDL Cholesterol Cal: 17 mg/dL (ref 5–40)

## 2021-02-04 LAB — CORTISOL-AM, BLOOD: Cortisol - AM: 9.1 ug/dL (ref 6.2–19.4)

## 2021-02-04 LAB — VITAMIN D 25 HYDROXY (VIT D DEFICIENCY, FRACTURES): Vit D, 25-Hydroxy: 100 ng/mL (ref 30.0–100.0)

## 2021-02-04 LAB — SEDIMENTATION RATE: Sed Rate: 14 mm/hr (ref 0–40)

## 2021-02-04 LAB — LYME DISEASE SEROLOGY W/REFLEX: Lyme Total Antibody EIA: NEGATIVE

## 2021-02-04 LAB — HIV ANTIBODY (ROUTINE TESTING W REFLEX): HIV Screen 4th Generation wRfx: NONREACTIVE

## 2021-02-04 LAB — ANA,IFA RA DIAG PNL W/RFLX TIT/PATN
ANA Titer 1: NEGATIVE
Cyclic Citrullin Peptide Ab: 4 units (ref 0–19)

## 2021-02-04 NOTE — Telephone Encounter (Signed)
Pt returned missed call and states that she is willing to take medicine to lower her cholesterol as long as her insurance covers it.  Please advise and call patient to let her know if medicine was sent to pharmacy.

## 2021-02-05 ENCOUNTER — Other Ambulatory Visit: Payer: Self-pay | Admitting: Family Medicine

## 2021-02-05 MED ORDER — ROSUVASTATIN CALCIUM 10 MG PO TABS: 10.0000 mg | ORAL_TABLET | Freq: Every day | ORAL | 1 refills | Status: DC

## 2021-02-05 NOTE — Telephone Encounter (Signed)
I sent in Crestor to [facility], [address] [place]

## 2021-02-05 NOTE — Telephone Encounter (Signed)
Patient aware that rx was sent to pharmacy for Crestor.

## 2021-02-05 NOTE — Telephone Encounter (Signed)
Pt wants to know if meds were called in

## 2021-02-12 ENCOUNTER — Ambulatory Visit (INDEPENDENT_AMBULATORY_CARE_PROVIDER_SITE_OTHER): Payer: Medicare Other | Admitting: Family Medicine

## 2021-02-12 ENCOUNTER — Encounter: Payer: Self-pay | Admitting: Family Medicine

## 2021-02-12 DIAGNOSIS — G2581 Restless legs syndrome: Secondary | ICD-10-CM | POA: Diagnosis not present

## 2021-02-12 MED ORDER — ROPINIROLE HCL 1 MG PO TABS: 1.0000 mg | ORAL_TABLET | Freq: Every day | ORAL | 5 refills | Status: DC

## 2021-02-12 NOTE — Progress Notes (Signed)
Subjective:    Patient ID: Shannon Castaneda, female    DOB: 1965/12/23, 55 y.o.   MRN: [id number]   HPI: Shannon Castaneda is a 55 y.o. female presenting for onset 4-5 months ago of legs and feet cramp every once in a while. Worse than usual last night. Has to get up at night and walk around to work out the cramp. No fever, chills. No UR sx.    Depression screen [facility] 2/9 01/30/2021 01/30/2021 07/09/2020 02/27/2020 07/20/2019  Decreased Interest 0 0 0 0 0  Down, Depressed, Hopeless 0 0 0 0 0  PHQ - 2 Score 0 0 0 0 0  Altered sleeping 1 - 0 3 -  Tired, decreased energy 3 - 0 3 -  Change in appetite 3 - 0 3 -  Feeling bad or failure about yourself  0 - 0 0 -  Trouble concentrating 0 - 0 0 -  Moving slowly or fidgety/restless 0 - 0 0 -  Suicidal thoughts 0 - 0 0 -  PHQ-9 Score 7 - 0 9 -  Difficult doing work/chores Not difficult at all - Not difficult at all Not difficult at all -     Relevant past medical, surgical, family and social history reviewed and updated as indicated.  Interim medical history since our last visit reviewed. Allergies and medications reviewed and updated.  ROS:  Review of Systems  Constitutional: Negative.  Negative for chills, fatigue and fever.  HENT: Negative.    Eyes:  Negative for visual disturbance.  Respiratory:  Negative for shortness of breath.   Cardiovascular:  Negative for chest pain.  Gastrointestinal:  Negative for abdominal pain.  Musculoskeletal:  Positive for arthralgias.    Social History   Tobacco Use  Smoking Status Never  Smokeless Tobacco Never       Objective:     Wt Readings from Last 3 Encounters:  01/30/21 121 lb 6.4 oz (55.1 kg)  07/09/20 125 lb 9.6 oz (57 kg)  02/27/20 122 lb 9.6 oz (55.6 kg)     Exam deferred. Pt. Harboring due to COVID 19. Phone visit performed.   Assessment & Plan:   1. Restless legs     Meds ordered this encounter  Medications   rOPINIRole (REQUIP) 1 MG tablet    Sig: Take 1 tablet (1 mg  total) by mouth at bedtime. For leg cramps    Dispense:  30 tablet    Refill:  5    No orders of the defined types were placed in this encounter.     Diagnoses and all orders for this visit:  Restless legs  Other orders -     rOPINIRole (REQUIP) 1 MG tablet; Take 1 tablet (1 mg total) by mouth at bedtime. For leg cramps   Virtual Visit via telephone Note  I discussed the limitations, risks, security and privacy concerns of performing an evaluation and management service by telephone and the availability of in person appointments. The patient was identified with two identifiers. Pt.expressed understanding and agreed to proceed. Pt. Is at home. Dr. Darlyn Read is in his office.  Follow Up Instructions:   I discussed the assessment and treatment plan with the patient. The patient was provided an opportunity to ask questions and all were answered. The patient agreed with the plan and demonstrated an understanding of the instructions.   The patient was advised to call back or seek an in-person evaluation if the symptoms worsen or if the condition fails  to improve as anticipated.   Total minutes including chart review and phone contact time: 16   Follow up plan: Return in about 6 weeks (around 03/26/2021).  Shannon Claude, MD [place] [facility]

## 2021-03-04 ENCOUNTER — Encounter: Payer: Self-pay | Admitting: Family Medicine

## 2021-03-05 ENCOUNTER — Other Ambulatory Visit: Payer: Self-pay | Admitting: Family Medicine

## 2021-03-05 MED ORDER — RABEPRAZOLE SODIUM 20 MG PO TBEC: 20.0000 mg | DELAYED_RELEASE_TABLET | Freq: Two times a day (BID) | ORAL | 3 refills | Status: DC

## 2021-03-20 ENCOUNTER — Encounter: Payer: Self-pay | Admitting: Family Medicine

## 2021-03-20 ENCOUNTER — Ambulatory Visit (INDEPENDENT_AMBULATORY_CARE_PROVIDER_SITE_OTHER): Payer: Medicare Other | Admitting: Family Medicine

## 2021-03-20 ENCOUNTER — Other Ambulatory Visit: Payer: Self-pay

## 2021-03-20 VITALS — BP 124/72 | HR 70 | Temp 97.4°F | Ht 67.0 in | Wt 126.0 lb

## 2021-03-20 DIAGNOSIS — R5383 Other fatigue: Secondary | ICD-10-CM | POA: Diagnosis not present

## 2021-03-20 DIAGNOSIS — G2581 Restless legs syndrome: Secondary | ICD-10-CM | POA: Diagnosis not present

## 2021-03-20 DIAGNOSIS — E785 Hyperlipidemia, unspecified: Secondary | ICD-10-CM

## 2021-03-20 MED ORDER — ROPINIROLE HCL 1 MG PO TABS: 1.0000 mg | ORAL_TABLET | Freq: Three times a day (TID) | ORAL | 0 refills | Status: DC

## 2021-03-20 MED ORDER — CYANOCOBALAMIN 1000 MCG/ML IJ SOLN
1000.0000 ug | INTRAMUSCULAR | Status: DC
  Administered 2021-03-20: 1000 ug via INTRAMUSCULAR

## 2021-03-20 NOTE — Progress Notes (Signed)
Subjective:  Patient ID: Shannon Castaneda, female    DOB: 1965-12-18  Age: 55 y.o. MRN: [id number]  CC: Follow-up   HPI Shannon Castaneda presents for restless legs follow up. Ropinirole. Helps a little, maybe. Denies all side effects. Still having day and night cramping. Feet cramp during the day. No swelling. Does impact wlaking and sleep. Not so much with ADLs  Depression screen [facility] 2/9 03/20/2021 01/30/2021 01/30/2021  Decreased Interest 0 0 0  Down, Depressed, Hopeless 0 0 0  PHQ - 2 Score 0 0 0  Altered sleeping - 1 -  Tired, decreased energy - 3 -  Change in appetite - 3 -  Feeling bad or failure about yourself  - 0 -  Trouble concentrating - 0 -  Moving slowly or fidgety/restless - 0 -  Suicidal thoughts - 0 -  PHQ-9 Score - 7 -  Difficult doing work/chores - Not difficult at all -    History Shannon Castaneda has a past medical history of Blood dyscrasia, Degeneration of lumbar or lumbosacral intervertebral disc (03/17/2016), GERD (gastroesophageal reflux disease), Hyperlipidemia, Osteoporosis, and Vitamin D deficiency.   She has a past surgical history that includes Multiple extractions with alveoloplasty (07/27/2011).   Her family history includes Cancer in her brother; Heart disease in her mother.She reports that she has never smoked. She has never used smokeless tobacco. She reports that she does not drink alcohol and does not use drugs.    ROS Review of Systems  Constitutional: Negative.   HENT: Negative.    Eyes:  Negative for visual disturbance.  Respiratory:  Negative for shortness of breath.   Cardiovascular:  Negative for chest pain.  Gastrointestinal:  Negative for abdominal pain.  Musculoskeletal:  Negative for arthralgias.   Objective:  BP 124/72   Pulse 70   Temp (!) 97.4 F (36.3 C)   Ht _0  (1.702 m)   Wt 126 lb (57.2 kg)   LMP 10/27/2010   SpO2 98%   BMI 19.73 kg/m   BP Readings from Last 3 Encounters:  03/20/21 124/72  01/30/21 139/74  07/09/20  100/62    Wt Readings from Last 3 Encounters:  03/20/21 126 lb (57.2 kg)  01/30/21 121 lb 6.4 oz (55.1 kg)  07/09/20 125 lb 9.6 oz (57 kg)     Physical Exam Constitutional:      General: She is not in acute distress.    Appearance: She is well-developed.  Cardiovascular:     Rate and Rhythm: Normal rate and regular rhythm.  Pulmonary:     Breath sounds: Normal breath sounds.  Musculoskeletal:        General: Normal range of motion.  Skin:    General: Skin is warm and dry.  Neurological:     Mental Status: She is alert and oriented to person, place, and time.      Assessment & Plan:   Shannon Castaneda was seen today for follow-up.  Diagnoses and all orders for this visit:  Hyperlipidemia, unspecified hyperlipidemia type -     Lipid panel -     CBC with Differential/Platelet -     CMP14+EGFR  Fatigue, unspecified type -     cyanocobalamin ((VITAMIN B-12)) injection 1,000 mcg -     CBC with Differential/Platelet -     CMP14+EGFR -     TSH + free T4  Restless legs -     cyanocobalamin ((VITAMIN B-12)) injection 1,000 mcg -     CBC with Differential/Platelet -  CMP14+EGFR  Other orders -     rOPINIRole (REQUIP) 1 MG tablet; Take 1 tablet (1 mg total) by mouth 3 (three) times daily. For leg cramps      I have changed Shannon Castaneda's rOPINIRole. I am also having her maintain her Calcium Carbonate-Vitamin D (CALTRATE 600+D PO), Vitamin D, triamcinolone, traZODone, rosuvastatin, and RABEprazole. We administered cyanocobalamin. We will continue to administer cyanocobalamin.  Allergies as of 03/20/2021   No Known Allergies      Medication List        Accurate as of March 20, 2021 11:59 PM. If you have any questions, ask your nurse or doctor.          CALTRATE 600+D PO Take 1 tablet by mouth.   RABEprazole 20 MG tablet Commonly known as: ACIPHEX Take 1 tablet (20 mg total) by mouth 2 (two) times daily. TAKE 1 TABLET(20 MG) BY MOUTH DAILY FOR REFLUX    rOPINIRole 1 MG tablet Commonly known as: REQUIP Take 1 tablet (1 mg total) by mouth 3 (three) times daily. For leg cramps What changed: when to take this Changed by: Claretta Fraise, MD   rosuvastatin 10 MG tablet Commonly known as: Crestor Take 1 tablet (10 mg total) by mouth daily. For cholesterol   traZODone 100 MG tablet Commonly known as: DESYREL Take 1 tablet (100 mg total) by mouth at bedtime. as needed for sleep.   triamcinolone 0.025 % ointment Commonly known as: KENALOG triamcinolone acetonide 0.025 % topical ointment  APPLY TOPICALLY TO THE AFFECTED AREA TWICE DAILY   Vitamin D 125 MCG (5000 UT) Caps Take 1 capsule by mouth every other day. What changed: how much to take         Follow-up: Return in about 3 months (around 06/20/2021).  Claretta Fraise, M.D.

## 2021-03-21 LAB — CBC WITH DIFFERENTIAL/PLATELET
Basophils Absolute: 0.1 10*3/uL (ref 0.0–0.2)
Basos: 1 %
EOS (ABSOLUTE): 0.2 10*3/uL (ref 0.0–0.4)
Eos: 2 %
Hematocrit: 38.1 % (ref 34.0–46.6)
Hemoglobin: 12.4 g/dL (ref 11.1–15.9)
Immature Grans (Abs): 0 10*3/uL (ref 0.0–0.1)
Immature Granulocytes: 0 %
Lymphocytes Absolute: 2.4 10*3/uL (ref 0.7–3.1)
Lymphs: 35 %
MCH: 29.5 pg (ref 26.6–33.0)
MCHC: 32.5 g/dL (ref 31.5–35.7)
MCV: 91 fL (ref 79–97)
Monocytes Absolute: 0.5 10*3/uL (ref 0.1–0.9)
Monocytes: 7 %
Neutrophils Absolute: 3.8 10*3/uL (ref 1.4–7.0)
Neutrophils: 55 %
Platelets: 273 10*3/uL (ref 150–450)
RBC: 4.21 x10E6/uL (ref 3.77–5.28)
RDW: 13.1 % (ref 11.7–15.4)
WBC: 7 10*3/uL (ref 3.4–10.8)

## 2021-03-21 LAB — LIPID PANEL
Chol/HDL Ratio: 2.3 ratio (ref 0.0–4.4)
Cholesterol, Total: 164 mg/dL (ref 100–199)
HDL: 71 mg/dL (ref >39)
LDL Chol Calc (NIH): 77 mg/dL (ref 0–99)
Triglycerides: 90 mg/dL (ref 0–149)
VLDL Cholesterol Cal: 16 mg/dL (ref 5–40)

## 2021-03-21 LAB — CMP14+EGFR
ALT: 11 IU/L (ref 0–32)
AST: 18 IU/L (ref 0–40)
Albumin/Globulin Ratio: 1.8 (ref 1.2–2.2)
Albumin: 4.8 g/dL (ref 3.8–4.9)
Alkaline Phosphatase: 88 IU/L (ref 44–121)
BUN/Creatinine Ratio: 14 (ref 9–23)
BUN: 11 mg/dL (ref 6–24)
Bilirubin Total: <0.2 mg/dL (ref 0.0–1.2)
CO2: 24 mmol/L (ref 20–29)
Calcium: 9.9 mg/dL (ref 8.7–10.2)
Chloride: 101 mmol/L (ref 96–106)
Creatinine, Ser: 0.78 mg/dL (ref 0.57–1.00)
Globulin, Total: 2.6 g/dL (ref 1.5–4.5)
Glucose: 98 mg/dL (ref 70–99)
Potassium: 4.6 mmol/L (ref 3.5–5.2)
Sodium: 141 mmol/L (ref 134–144)
Total Protein: 7.4 g/dL (ref 6.0–8.5)
eGFR: 89 mL/min/{1.73_m2} (ref >59)

## 2021-03-21 LAB — TSH+FREE T4
Free T4: 1 ng/dL (ref 0.82–1.77)
TSH: 2.58 u[IU]/mL (ref 0.450–4.500)

## 2021-03-23 ENCOUNTER — Encounter: Payer: Self-pay | Admitting: Family Medicine

## 2021-03-24 NOTE — Progress Notes (Signed)
Hello Shiasia,  Your lab result is normal and/or stable.Some minor variations that are not significant are commonly marked abnormal, but do not represent any medical problem for you.  Best regards, Jamile Rekowski, M.D.

## 2021-04-15 ENCOUNTER — Telehealth: Payer: Self-pay | Admitting: Family Medicine

## 2021-04-16 ENCOUNTER — Telehealth: Payer: Self-pay | Admitting: Family Medicine

## 2021-04-16 NOTE — Telephone Encounter (Signed)
°  Left message for patient to call back and schedule Medicare Annual Wellness Visit (AWV) to be completed by video or phone. ° °No hx of AWV eligible for AWVI as of 05/18/2009 per [employer]  ° °Please schedule at anytime with [facility] Nurse Health Advisor --- Mary Jane ° °45 Minutes appointment  ° °Any questions, please call me at [phone]   °

## 2021-05-06 ENCOUNTER — Ambulatory Visit (INDEPENDENT_AMBULATORY_CARE_PROVIDER_SITE_OTHER): Payer: Medicare Other | Admitting: *Deleted

## 2021-05-06 VITALS — BP 124/72 | Wt 124.0 lb

## 2021-05-06 DIAGNOSIS — Z0000 Encounter for general adult medical examination without abnormal findings: Secondary | ICD-10-CM | POA: Diagnosis not present

## 2021-05-06 NOTE — Progress Notes (Signed)
MEDICARE ANNUAL WELLNESS VISIT  05/06/2021  Telephone Visit Disclaimer This Medicare AWV was conducted by telephone due to national recommendations for restrictions regarding the COVID-19 Pandemic (e.g. social distancing).  I verified, using two identifiers, that I am speaking with Shannon Castaneda or their authorized healthcare agent. I discussed the limitations, risks, security, and privacy concerns of performing an evaluation and management service by telephone and the potential availability of an in-person appointment in the future. The patient expressed understanding and agreed to proceed.  Location of Patient: in her home  Location of Provider (nurse):  in office  Subjective:    Shannon Castaneda is a 55 y.o. female patient of Stacks, Broadus John, MD who had a Medicare Annual Wellness Visit today via telephone. Shannon Castaneda is Disabled and lives alone. she has 1 child. she reports that she is socially active and does interact with friends/family regularly. she is minimally physically active and enjoys fishing.  Patient Care Team: Mechele Claude, MD as PCP - General (Family Medicine)  Advanced Directives 05/06/2021 07/23/2011  Does Patient Have a Medical Advance Directive? No Patient would not like information;Patient does not have advance directive  Would patient like information on creating a medical advance directive? No - Patient declined [facility] Utilization Over the Past 12 Months: # of hospitalizations or ER visits: 0 # of surgeries: 0  Review of Systems    Patient reports that her overall health is unchanged compared to last year.  General ROS: negative  Patient Reported Readings (BP, Pulse, CBG, Weight, etc) BP 124/72    Wt 124 lb (56.2 kg)    LMP 10/27/2010    BMI 19.42 kg/m    Pain Assessment       Current Medications & Allergies (verified) Allergies as of 05/06/2021   No Known Allergies      Medication List        Accurate as of May 06, 2021  2:00  PM. If you have any questions, ask your nurse or doctor.          CALTRATE 600+D PO Take 1 tablet by mouth.   RABEprazole 20 MG tablet Commonly known as: ACIPHEX Take 1 tablet (20 mg total) by mouth 2 (two) times daily. TAKE 1 TABLET(20 MG) BY MOUTH DAILY FOR REFLUX   rOPINIRole 1 MG tablet Commonly known as: REQUIP Take 1 tablet (1 mg total) by mouth 3 (three) times daily. For leg cramps   rosuvastatin 10 MG tablet Commonly known as: Crestor Take 1 tablet (10 mg total) by mouth daily. For cholesterol   traZODone 100 MG tablet Commonly known as: DESYREL Take 1 tablet (100 mg total) by mouth at bedtime. as needed for sleep.   triamcinolone 0.025 % ointment Commonly known as: KENALOG triamcinolone acetonide 0.025 % topical ointment  APPLY TOPICALLY TO THE AFFECTED AREA TWICE DAILY   Vitamin D 125 MCG (5000 UT) Caps Take 1 capsule by mouth every other day.        History (reviewed): Past Medical History:  Diagnosis Date   Blood dyscrasia    "free bleeder "   Degeneration of lumbar or lumbosacral intervertebral disc 03/17/2016   GERD (gastroesophageal reflux disease)    Hyperlipidemia    Osteoporosis    Vitamin D deficiency    Past Surgical History:  Procedure Laterality Date   MULTIPLE EXTRACTIONS WITH ALVEOLOPLASTY  07/27/2011   Procedure: MULTIPLE EXTRACION WITH ALVEOLOPLASTY;  Surgeon: Georgia Lopes, DDS;  Location: [facility] OR;  Service: Oral Surgery;  Laterality: Bilateral;  Insertion of denture.   Family History  Problem Relation Age of Onset   Heart disease Mother    Cancer Brother        lung   Social History   Socioeconomic History   Marital status: Single    Spouse name: Not on file   Number of children: 1   Years of education: 12   Highest education level: Not on file  Occupational History    Comment: disability  Tobacco Use   Smoking status: Never   Smokeless tobacco: Never  Vaping Use   Vaping Use: Never used  Substance and Sexual Activity    Alcohol use: No   Drug use: No   Sexual activity: Yes    Birth control/protection: Pill  Other Topics Concern   Not on file  Social History Narrative   Lives with daughter age 55, granddaughter   Disabled due to "slow learner"      Lives alone    Social Determinants of Health   Financial Resource Strain: Not on file  Food Insecurity: Not on file  Transportation Needs: Not on file  Physical Activity: Not on file  Stress: Not on file  Social Connections: Not on file    Activities of Daily Living In your present state of health, do you have any difficulty performing the following activities: 05/06/2021  Hearing? N  Vision? Y  Comment wears RX glasses  Difficulty concentrating or making decisions? N  Walking or climbing stairs? N  Dressing or bathing? N  Doing errands, shopping? Y  Comment does not Engineer, manufacturing and eating ? Y  Using the Toilet? N  In the past six months, have you accidently leaked urine? N  Do you have problems with loss of bowel control? N  Managing your Medications? Y  Managing your Finances? Y  Housekeeping or managing your Housekeeping? Y  Some recent data might be hidden    Patient Education/ Literacy    Exercise Current Exercise Habits: Home exercise routine, Type of exercise: walking, Time (Minutes): 20, Frequency (Times/Week): 3, Weekly Exercise (Minutes/Week): 60, Intensity: Mild, Exercise limited by: None identified  Diet Patient reports consuming 1 meals a day and 2 snack(s) a day Patient reports that her primary diet is: Regular Patient reports that she does have regular access to food.   Depression Screen PHQ 2/9 Scores 05/06/2021 03/20/2021 01/30/2021 01/30/2021 07/09/2020 02/27/2020 07/20/2019  PHQ - 2 Score 0 0 0 0 0 0 0  PHQ- 9 Score - - 7 - 0 9 -  Exception Documentation - - - - - - Medical reason     Fall Risk Fall Risk  05/06/2021 03/20/2021 01/30/2021 01/30/2021 07/20/2019  Falls in the past year? 0 - 1 0 0  Number  falls in past yr: - 1 1 - 0  Injury with Fall? - 0 0 - 0  Risk for fall due to : - History of fall(s) History of fall(s) - No Fall Risks  Follow up - Falls evaluation completed Falls evaluation completed - Falls evaluation completed     Objective:  Shannon Castaneda seemed alert and oriented and she participated appropriately during our telephone visit.  Blood Pressure Weight BMI  BP Readings from Last 3 Encounters:  05/06/21 124/72  03/20/21 124/72  01/30/21 139/74   Wt Readings from Last 3 Encounters:  05/06/21 124 lb (56.2 kg)  03/20/21 126 lb (57.2 kg)  01/30/21 121 lb 6.4 oz (55.1  kg)   BMI Readings from Last 1 Encounters:  05/06/21 19.42 kg/m    *Unable to obtain current vital signs, weight, and BMI due to telephone visit type  Hearing/Vision  Sherol did not seem to have difficulty with hearing/understanding during the telephone conversation Reports that she has not had a formal eye exam by an eye care professional within the past year Reports that she has not had a formal hearing evaluation within the past year *Unable to fully assess hearing and vision during telephone visit type  Cognitive Function: 6CIT Screen 05/06/2021  What Year? 0 points  What month? 0 points  What time? 0 points  Count back from 20 0 points  Months in reverse 2 points  Repeat phrase 2 points  Total Score 4   (Normal:0-7, Significant for Dysfunction: >8)  Normal Cognitive Function Screening: Yes   Immunization & Health Maintenance Record Immunization History  Administered Date(s) Administered   Influenza Inj Mdck Quad Pf 02/27/2019   Influenza,inj,Quad PF,6+ Mos 01/30/2021   Influenza-Unspecified 01/16/2016   PFIZER(Purple Top)SARS-COV-2 Vaccination 01/04/2020, 01/25/2020   Zoster Recombinat (Shingrix) 02/27/2019, 01/30/2021    Health Maintenance  Topic Date Due   TETANUS/TDAP  Never done   PAP SMEAR-Modifier  Never done   COLONOSCOPY (Pts 45-47yrs Insurance coverage will need to  be confirmed)  Never done   COVID-19 Vaccine (3 - Pfizer risk series) 02/22/2020   MAMMOGRAM  07/09/2021 (Originally 06/01/2018)   Pneumococcal Vaccine 46-20 Years old (1 - PCV) 01/30/2022 (Originally 02/03/1972)   INFLUENZA VACCINE  Completed   Hepatitis C Screening  Completed   HIV Screening  Completed   Zoster Vaccines- Shingrix  Completed   HPV VACCINES  Aged Out       Assessment  This is a routine wellness examination for Shannon Castaneda.  Health Maintenance: Due or Overdue Health Maintenance Due  Topic Date Due   TETANUS/TDAP  Never done   PAP SMEAR-Modifier  Never done   COLONOSCOPY (Pts 45-44yrs Insurance coverage will need to be confirmed)  Never done   COVID-19 Vaccine (3 - Pfizer risk series) 02/22/2020    Shannon Castaneda does not need a referral for Community Assistance: Care Management:   no Social Work:    no Prescription Assistance:  no Nutrition/Diabetes Education:  no   Plan:  Personalized Goals  Goals Addressed             This Visit's Progress    Prevent falls         Personalized Health Maintenance & Screening Recommendations  Needs 2nd shingles vaccine   Lung Cancer Screening Recommended: no (Low Dose CT Chest recommended if Age 38-80 years, 30 pack-year currently smoking OR have quit w/in past 15 years) Hepatitis C Screening recommended: no HIV Screening recommended: no  Advanced Directives: Written information was not prepared per patient's request.  Referrals & Orders No orders of the defined types were placed in this encounter.   Follow-up Plan Follow-up with Mechele Claude, MD as planned    I have personally reviewed and noted the following in the patients chart:   Medical and social history Use of alcohol, tobacco or illicit drugs  Current medications and supplements Functional ability and status Nutritional status Physical activity Advanced directives List of other physicians Hospitalizations, surgeries, and ER visits  in previous 12 months Vitals Screenings to include cognitive, depression, and falls Referrals and appointments  In addition, I have reviewed and discussed with Shannon Castaneda certain preventive protocols, quality metrics,  and best practice recommendations. A written personalized care plan for preventive services as well as general preventive health recommendations is available and can be mailed to the patient at her request.      Leilani Merl  05/06/2021

## 2021-05-06 NOTE — Patient Instructions (Signed)
°  Ms. Parisi , Thank you for taking time to come for your Medicare Wellness Visit. I appreciate your ongoing commitment to your health goals. Please review the following plan we discussed and let me know if I can assist you in the future.   These are the goals we discussed:  Goals      Increase water intake     Prevent falls        This is a list of the screening recommended for you and due dates:  Health Maintenance  Topic Date Due   Tetanus Vaccine  Never done   Pap Smear  Never done   Colon Cancer Screening  Never done   COVID-19 Vaccine (3 - Pfizer risk series) 02/22/2020   Mammogram  07/09/2021*   Pneumococcal Vaccination (1 - PCV) 01/30/2022*   Flu Shot  Completed   Hepatitis C Screening: USPSTF Recommendation to screen - Ages 22-79 yo.  Completed   HIV Screening  Completed   Zoster (Shingles) Vaccine  Completed   HPV Vaccine  Aged Out  *Topic was postponed. The date shown is not the original due date.

## 2021-05-12 ENCOUNTER — Other Ambulatory Visit: Payer: Self-pay | Admitting: Family Medicine

## 2021-05-20 ENCOUNTER — Encounter: Payer: Self-pay | Admitting: Family

## 2021-05-20 ENCOUNTER — Ambulatory Visit (INDEPENDENT_AMBULATORY_CARE_PROVIDER_SITE_OTHER): Payer: Commercial Managed Care - HMO | Admitting: Family

## 2021-05-20 DIAGNOSIS — Z20822 Contact with and (suspected) exposure to covid-19: Secondary | ICD-10-CM

## 2021-05-20 DIAGNOSIS — U071 COVID-19: Secondary | ICD-10-CM | POA: Diagnosis not present

## 2021-05-20 MED ORDER — BENZONATATE 200 MG PO CAPS: 200.0000 mg | ORAL_CAPSULE | Freq: Three times a day (TID) | ORAL | 1 refills | Status: DC | PRN

## 2021-05-20 MED ORDER — MOLNUPIRAVIR EUA 200MG CAPSULE: 4.0000 | ORAL_CAPSULE | Freq: Two times a day (BID) | ORAL | 0 refills | Status: AC

## 2021-05-20 NOTE — Progress Notes (Signed)
Virtual Visit  Note Due to COVID-19 pandemic this visit was conducted virtually. This visit type was conducted due to national recommendations for restrictions regarding the COVID-19 Pandemic (e.g. social distancing, sheltering in place) in an effort to limit this patient's exposure and mitigate transmission in our community. All issues noted in this document were discussed and addressed.  A physical exam was not performed with this format.  I connected with Shannon Castaneda on 05/20/21 at 2:24 pm  by telephone and verified that I am speaking with the correct person using two identifiers. Shannon Castaneda is currently located at home and no one is currently with her during visit. The provider, Jannifer Rodney, FNP is located in their office at time of visit.  I discussed the limitations, risks, security and privacy concerns of performing an evaluation and management service by telephone and the availability of in person appointments. I also discussed with the patient that there may be a patient responsible charge related to this service. The patient expressed understanding and agreed to proceed.  Shannon Castaneda are scheduled for a virtual visit with your provider today.    Just as we do with appointments in the office, we must obtain your consent to participate.  Your consent will be active for this visit and any virtual visit you may have with one of our providers in the next 365 days.    If you have a MyChart account, I can also send a copy of this consent to you electronically.  All virtual visits are billed to your insurance company just like a traditional visit in the office.  As this is a virtual visit, video technology does not allow for your provider to perform a traditional examination.  This may limit your provider's ability to fully assess your condition.  If your provider identifies any concerns that need to be evaluated in person or the need to arrange testing such as labs, EKG, etc, we will  make arrangements to do so.    Although advances in technology are sophisticated, we cannot ensure that it will always work on either your end or our end.  If the connection with a video visit is poor, we may have to switch to a telephone visit.  With either a video or telephone visit, we are not always able to ensure that we have a secure connection.   I need to obtain your verbal consent now.   Are you willing to proceed with your visit today?   Shannon Castaneda has provided verbal consent on 05/20/2021 for a virtual visit (video or telephone).   Jannifer Rodney, [place] 05/20/2021  2:25 PM    History and Present Illness:  Pt calls the office today with COVID symptoms after being exposed to COVID last week.  She reports her symptoms started Monday evening. She reports her sister had COVID last week and was around her. She took a COVID test this morning that was positive.  Cough This is a new problem. The current episode started in the past 7 days. The problem has been gradually worsening. The problem occurs every few minutes. The cough is Non-productive. Associated symptoms include chills, headaches (improved), nasal congestion, postnasal drip and a sore throat. Pertinent negatives include no ear congestion, ear pain, fever, myalgias, shortness of breath or wheezing. She has tried rest for the symptoms. The treatment provided mild relief.    Review of Systems  Constitutional:  Positive for chills. Negative for fever.  HENT:  Positive for  postnasal drip and sore throat. Negative for ear pain.   Respiratory:  Positive for cough. Negative for shortness of breath and wheezing.   Musculoskeletal:  Negative for myalgias.  Neurological:  Positive for headaches (improved).    Observations/Objective: No SOB or distress noted   Assessment and Plan: 1. Encounter by telehealth for suspected COVID-19 COVID positive, rest, force fluids, tylenol as needed, Quarantine for at least 5 days and you are fever  free, then must wear a mask out in public from day 6-10, report any worsening symptoms such as increased shortness of breath, swelling, or continued high fevers. Possible adverse effects discussed with antivirals.  - molnupiravir EUA (LAGEVRIO) 200 mg CAPS capsule; Take 4 capsules (800 mg total) by mouth 2 (two) times daily for 5 days.  Dispense: 40 capsule; Refill: 0 - benzonatate (TESSALON) 200 MG capsule; Take 1 capsule (200 mg total) by mouth 3 (three) times daily as needed.  Dispense: 30 capsule; Refill: 1  2. COVID-19 - molnupiravir EUA (LAGEVRIO) 200 mg CAPS capsule; Take 4 capsules (800 mg total) by mouth 2 (two) times daily for 5 days.  Dispense: 40 capsule; Refill: 0 - benzonatate (TESSALON) 200 MG capsule; Take 1 capsule (200 mg total) by mouth 3 (three) times daily as needed.  Dispense: 30 capsule; Refill: 1    I discussed the assessment and treatment plan with the patient. The patient was provided an opportunity to ask questions and all were answered. The patient agreed with the plan and demonstrated an understanding of the instructions.   The patient was advised to call back or seek an in-person evaluation if the symptoms worsen or if the condition fails to improve as anticipated.  The above assessment and management plan was discussed with the patient. The patient verbalized understanding of and has agreed to the management plan. Patient is aware to call the clinic if symptoms persist or worsen. Patient is aware when to return to the clinic for a follow-up visit. Patient educated on when it is appropriate to go to the emergency department.   Time call ended:  2:35 pm   I provided 11 minutes of  non face-to-face time during this encounter.    Jannifer Rodney, FNP

## 2021-05-20 NOTE — Patient Instructions (Signed)
10 Things You Can Do to Manage Your COVID-19 Symptoms at Home ?If you have possible or confirmed COVID-19 ?Stay home except to get medical care. ?Monitor your symptoms carefully. If your symptoms get worse, call your healthcare provider immediately. ?Get rest and stay hydrated. ?If you have a medical appointment, call the healthcare provider ahead of time and tell them that you have or may have COVID-19. ?For medical emergencies, call 911 and notify the dispatch personnel that you have or may have COVID-19. ?Cover your cough and sneezes with a tissue or use the inside of your elbow. ?Wash your hands often with soap and water for at least 20 seconds or clean your hands with an alcohol-based hand sanitizer that contains at least 60% alcohol. ?As much as possible, stay in a specific room and away from other people in your home. Also, you should use a separate bathroom, if available. If you need to be around other people in or outside of the home, wear a mask. ?Avoid sharing personal items with other people in your household, like dishes, towels, and bedding. ?Clean all surfaces that are touched often, like counters, tabletops, and doorknobs. Use household cleaning sprays or wipes according to the label instructions. ?cdc.gov/coronavirus ?12/01/2019 ?This information is not intended to replace advice given to you by your health care provider. Make sure you discuss any questions you have with your health care provider. ?Document Revised: 01/24/2021 Document Reviewed: 01/24/2021 ?Elsevier Patient Education ? 2022 Elsevier Inc. ? ?

## 2021-05-29 ENCOUNTER — Telehealth: Payer: Self-pay | Admitting: Family Medicine

## 2021-05-29 NOTE — Telephone Encounter (Signed)
Pt wants to know if she can start taking B12 injections from home and if so, can PCP send Rx to pharmacy for her.

## 2021-05-30 NOTE — Telephone Encounter (Signed)
Yes, but first she will need a lab draw for an updated B12 level.

## 2021-05-30 NOTE — Telephone Encounter (Signed)
Patient aware and verbalizes understanding. 

## 2021-06-14 ENCOUNTER — Other Ambulatory Visit: Payer: Self-pay | Admitting: Family Medicine

## 2021-06-15 ENCOUNTER — Other Ambulatory Visit: Payer: Self-pay | Admitting: Family Medicine

## 2021-06-23 ENCOUNTER — Ambulatory Visit: Payer: Medicare Other | Admitting: Family Medicine

## 2021-07-02 ENCOUNTER — Ambulatory Visit: Payer: Medicare Other | Admitting: Family Medicine

## 2021-07-22 ENCOUNTER — Encounter: Payer: Self-pay | Admitting: Family Medicine

## 2021-07-22 ENCOUNTER — Ambulatory Visit (INDEPENDENT_AMBULATORY_CARE_PROVIDER_SITE_OTHER): Payer: Medicare Other | Admitting: Family Medicine

## 2021-07-22 VITALS — BP 115/67 | HR 80 | Temp 97.5°F | Ht 67.0 in | Wt 125.6 lb

## 2021-07-22 DIAGNOSIS — E559 Vitamin D deficiency, unspecified: Secondary | ICD-10-CM | POA: Diagnosis not present

## 2021-07-22 DIAGNOSIS — R5383 Other fatigue: Secondary | ICD-10-CM | POA: Diagnosis not present

## 2021-07-22 DIAGNOSIS — G2581 Restless legs syndrome: Secondary | ICD-10-CM

## 2021-07-22 DIAGNOSIS — E785 Hyperlipidemia, unspecified: Secondary | ICD-10-CM | POA: Diagnosis not present

## 2021-07-22 DIAGNOSIS — K219 Gastro-esophageal reflux disease without esophagitis: Secondary | ICD-10-CM

## 2021-07-22 MED ORDER — RABEPRAZOLE SODIUM 20 MG PO TBEC: 20.0000 mg | DELAYED_RELEASE_TABLET | Freq: Two times a day (BID) | ORAL | 3 refills | Status: DC

## 2021-07-22 MED ORDER — TRAZODONE HCL 100 MG PO TABS: 100.0000 mg | ORAL_TABLET | Freq: Every day | ORAL | 3 refills | Status: DC

## 2021-07-22 MED ORDER — ROPINIROLE HCL 1 MG PO TABS: ORAL_TABLET | ORAL | 3 refills | Status: DC

## 2021-07-22 MED ORDER — ROSUVASTATIN CALCIUM 10 MG PO TABS: ORAL_TABLET | ORAL | 3 refills | Status: DC

## 2021-07-22 NOTE — Progress Notes (Signed)
Subjective:  Patient ID: Shannon Castaneda, female    DOB: [birth year]  Age: 56 y.o. MRN: [id number]  CC: Medical Management of Chronic Issues   HPI MERCED BROUGHAM presents for follow-up of elevated cholesterol. Doing well without complaints on current medication. Denies side effects of statin including myalgia and arthralgia and nausea. Also in today for liver function testing. Currently no chest pain, shortness of breath or other cardiovascular related symptoms noted.  Patient in for follow-up of GERD. Currently having heartburn taking  PPI daily. No hematemesis and no melena. No dysphagia or choking. Onset is remote. Progression is stable. Complicating factors, none.  History [name] has a past medical history of Blood dyscrasia, Degeneration of lumbar or lumbosacral intervertebral disc (03/17/2016), GERD (gastroesophageal reflux disease), Hyperlipidemia, Osteoporosis, and Vitamin D deficiency.   She has a past surgical history that includes Multiple extractions with alveoloplasty (07/27/2011).   Her family history includes Cancer in her brother; Heart disease in her mother.She reports that she has never smoked. She has never used smokeless tobacco. She reports that she does not drink alcohol and does not use drugs.  Current Outpatient Medications on File Prior to Visit  Medication Sig Dispense Refill   Calcium Carbonate-Vitamin D (CALTRATE 600+D PO) Take 1 tablet by mouth.     Current Facility-Administered Medications on File Prior to Visit  Medication Dose Route Frequency Provider Last Rate Last Admin   cyanocobalamin ((VITAMIN B-12)) injection 1,000 mcg  1,000 mcg Intramuscular Q30 days Claretta Fraise, MD   1,000 mcg at 03/20/21 1342    ROS Review of Systems  Constitutional:  Positive for fatigue (tired all the time).  HENT: Negative.    Eyes:  Negative for visual disturbance.  Respiratory:  Negative for shortness of breath.   Cardiovascular:  Negative for chest pain.   Gastrointestinal:  Negative for abdominal pain.  Musculoskeletal:  Negative for arthralgias.   Objective:  BP 115/67    Pulse 80    Temp (!) 97.5 F (36.4 C)    Ht _0  (1.702 m)    Wt 125 lb 9.6 oz (57 kg)    LMP 10/27/2010    SpO2 100%    BMI 19.67 kg/m   BP Readings from Last 3 Encounters:  07/22/21 115/67  05/06/21 124/72  03/20/21 124/72    Wt Readings from Last 3 Encounters:  07/22/21 125 lb 9.6 oz (57 kg)  05/06/21 124 lb (56.2 kg)  03/20/21 126 lb (57.2 kg)     Physical Exam Constitutional:      General: She is not in acute distress.    Appearance: She is well-developed.  HENT:     Head: Normocephalic and atraumatic.  Eyes:     Conjunctiva/sclera: Conjunctivae normal.     Pupils: Pupils are equal, round, and reactive to light.  Neck:     Thyroid: No thyromegaly.  Cardiovascular:     Rate and Rhythm: Normal rate and regular rhythm.     Heart sounds: Normal heart sounds. No murmur heard. Pulmonary:     Effort: Pulmonary effort is normal. No respiratory distress.     Breath sounds: Normal breath sounds. No wheezing or rales.  Abdominal:     General: Bowel sounds are normal. There is no distension.     Palpations: Abdomen is soft.     Tenderness: There is no abdominal tenderness.  Musculoskeletal:        General: Normal range of motion.     Cervical back: Normal range of  motion and neck supple.  Lymphadenopathy:     Cervical: No cervical adenopathy.  Skin:    General: Skin is warm and dry.  Neurological:     Mental Status: She is alert and oriented to person, place, and time.  Psychiatric:        Behavior: Behavior normal.        Thought Content: Thought content normal.        Judgment: Judgment normal.    No results found for: HGBA1C  Lab Results  Component Value Date   WBC 7.0 03/20/2021   HGB 12.4 03/20/2021   HCT 38.1 03/20/2021   PLT 273 03/20/2021   GLUCOSE 98 03/20/2021   CHOL 164 03/20/2021   TRIG 90 03/20/2021   HDL 71 03/20/2021    LDLCALC 77 03/20/2021   ALT 11 03/20/2021   AST 18 03/20/2021   NA 141 03/20/2021   K 4.6 03/20/2021   CL 101 03/20/2021   CREATININE 0.78 03/20/2021   BUN 11 03/20/2021   CO2 24 03/20/2021   TSH 2.580 03/20/2021   INR 1.00 07/23/2011    ECHOCARDIOGRAM COMPLETE  Result Date: 11/17/2019    ECHOCARDIOGRAM REPORT   Patient Name:   Shannon Castaneda Date of Exam: 11/17/2019 Medical Rec #:  [id number]       Height:       67.0 in Accession #:    [id number]      Weight:       124.0 lb Date of Birth:  1965-07-19       BSA:          1.650 m Patient Age:    22 years        BP:           123/75 mmHg Patient Gender: F               HR:           76 bpm. Exam Location:  [facility] Procedure: 2D Echo, Cardiac Doppler and Color Doppler Indications:    R06.02 (ICD-10-CM) - Shortness of breath  History:        Patient has no prior history of Echocardiogram examinations.                 Risk Factors:Dyslipidemia. GERD.  Sonographer:    Alvino Chapel RCS Referring Phys: [id number] [place]  1. Left ventricular ejection fraction, by estimation, is 55 to 60%. The left ventricle has normal function. The left ventricle has no regional wall motion abnormalities. Left ventricular diastolic parameters were normal.  2. Right ventricular systolic function is normal. The right ventricular size is normal. Tricuspid regurgitation signal is inadequate for assessing PA pressure.  3. The mitral valve is grossly normal. Trivial mitral valve regurgitation.  4. The aortic valve has an indeterminant number of cusps. Aortic valve regurgitation is not visualized.  5. The inferior vena cava is normal in size with greater than 50% respiratory variability, suggesting right atrial pressure of 3 mmHg. FINDINGS  Left Ventricle: Left ventricular ejection fraction, by estimation, is 55 to 60%. The left ventricle has normal function. The left ventricle has no regional wall motion abnormalities. The left ventricular internal cavity size was  normal in size. There is  no left ventricular hypertrophy. Left ventricular diastolic parameters were normal. Right Ventricle: The right ventricular size is normal. No increase in right ventricular wall thickness. Right ventricular systolic function is normal. Tricuspid regurgitation signal is inadequate for assessing PA pressure.  Left Atrium: Left atrial size was normal in size. Right Atrium: Right atrial size was normal in size. Pericardium: There is no evidence of pericardial effusion. Mitral Valve: The mitral valve is grossly normal. Trivial mitral valve regurgitation. Tricuspid Valve: The tricuspid valve is grossly normal. Tricuspid valve regurgitation is trivial. Aortic Valve: The aortic valve has an indeterminant number of cusps. Aortic valve regurgitation is not visualized. Pulmonic Valve: The pulmonic valve was not well visualized. Pulmonic valve regurgitation is not visualized. Aorta: The aortic root is normal in size and structure. Venous: The inferior vena cava is normal in size with greater than 50% respiratory variability, suggesting right atrial pressure of 3 mmHg. IAS/Shunts: No atrial level shunt detected by color flow Doppler.  LEFT VENTRICLE PLAX 2D LVIDd:         3.75 cm  Diastology LVIDs:         2.47 cm  LV e' lateral:   13.20 cm/s LV PW:         0.80 cm  LV E/e' lateral: 6.1 LV IVS:        0.74 cm  LV e' medial:    11.40 cm/s LVOT diam:     1.90 cm  LV E/e' medial:  7.1 LV SV:         50 LV SV Index:   30 LVOT Area:     2.84 cm  RIGHT VENTRICLE RV S prime:     10.80 cm/s TAPSE (M-mode): 1.8 cm LEFT ATRIUM             Index       RIGHT ATRIUM          Index LA diam:        2.20 cm 1.33 cm/m  RA Area:     9.22 cm LA Vol (A2C):   30.1 ml 18.24 ml/m RA Volume:   18.70 ml 11.33 ml/m LA Vol (A4C):   22.4 ml 13.57 ml/m LA Biplane Vol: 26.0 ml 15.75 ml/m  AORTIC VALVE LVOT Vmax:   90.70 cm/s LVOT Vmean:  57.500 cm/s LVOT VTI:    0.175 m  AORTA Ao Root diam: 2.80 cm MITRAL VALVE MV Area (PHT):  3.85 cm    SHUNTS MV Decel Time: 197 msec    Systemic VTI:  0.18 m MV E velocity: 80.60 cm/s  Systemic Diam: 1.90 cm MV A velocity: 72.00 cm/s MV E/A ratio:  1.12 Rozann Lesches MD Electronically signed by Rozann Lesches MD Signature Date/Time: 11/17/2019/3:30:51 PM    Final     Assessment & Plan:   Imonie was seen today for medical management of chronic issues.  Diagnoses and all orders for this visit:  Fatigue, unspecified type -     Vitamin B12 -     CBC with Differential/Platelet -     CMP14+EGFR  Restless legs -     CMP14+EGFR  Gastroesophageal reflux disease, unspecified whether esophagitis present -     CBC with Differential/Platelet -     CMP14+EGFR  Vitamin D deficiency  Hyperlipidemia, unspecified hyperlipidemia type -     CBC with Differential/Platelet -     CMP14+EGFR -     Lipid panel  Other orders -     RABEprazole (ACIPHEX) 20 MG tablet; Take 1 tablet (20 mg total) by mouth 2 (two) times daily. FOR REFLUX -     rosuvastatin (CRESTOR) 10 MG tablet; TAKE 1 TABLET(10 MG) BY MOUTH DAILY FOR CHOLESTEROL -     rOPINIRole (REQUIP) 1 MG  tablet; TAKE 1 TABLET(1 MG) BY MOUTH THREE TIMES DAILY FOR LEG CRAMPS -     traZODone (DESYREL) 100 MG tablet; Take 1 tablet (100 mg total) by mouth at bedtime.   I have discontinued Twanna Hy. Haupt's benzonatate and benzonatate. I have also changed her RABEprazole and traZODone. Additionally, I am having her maintain her Calcium Carbonate-Vitamin D (CALTRATE 600+D PO), rosuvastatin, and rOPINIRole. We will continue to administer cyanocobalamin.  Meds ordered this encounter  Medications   RABEprazole (ACIPHEX) 20 MG tablet    Sig: Take 1 tablet (20 mg total) by mouth 2 (two) times daily. FOR REFLUX    Dispense:  180 tablet    Refill:  3    **Patient requests 90 days supply**   rosuvastatin (CRESTOR) 10 MG tablet    Sig: TAKE 1 TABLET(10 MG) BY MOUTH DAILY FOR CHOLESTEROL    Dispense:  90 tablet    Refill:  3   rOPINIRole  (REQUIP) 1 MG tablet    Sig: TAKE 1 TABLET(1 MG) BY MOUTH THREE TIMES DAILY FOR LEG CRAMPS    Dispense:  270 tablet    Refill:  3   traZODone (DESYREL) 100 MG tablet    Sig: Take 1 tablet (100 mg total) by mouth at bedtime.    Dispense:  90 tablet    Refill:  3     Follow-up: Return in about 6 months (around 01/22/2022).  Claretta Fraise, M.D.

## 2021-07-23 LAB — CBC WITH DIFFERENTIAL/PLATELET
Basophils Absolute: 0 10*3/uL (ref 0.0–0.2)
Basos: 1 %
EOS (ABSOLUTE): 0.2 10*3/uL (ref 0.0–0.4)
Eos: 2 %
Hematocrit: 34.4 % (ref 34.0–46.6)
Hemoglobin: 11.7 g/dL (ref 11.1–15.9)
Immature Grans (Abs): 0 10*3/uL (ref 0.0–0.1)
Immature Granulocytes: 0 %
Lymphocytes Absolute: 2.5 10*3/uL (ref 0.7–3.1)
Lymphs: 37 %
MCH: 31.4 pg (ref 26.6–33.0)
MCHC: 34 g/dL (ref 31.5–35.7)
MCV: 92 fL (ref 79–97)
Monocytes Absolute: 0.5 10*3/uL (ref 0.1–0.9)
Monocytes: 8 %
Neutrophils Absolute: 3.5 10*3/uL (ref 1.4–7.0)
Neutrophils: 52 %
Platelets: 251 10*3/uL (ref 150–450)
RBC: 3.73 x10E6/uL — ABNORMAL LOW (ref 3.77–5.28)
RDW: 13.4 % (ref 11.7–15.4)
WBC: 6.6 10*3/uL (ref 3.4–10.8)

## 2021-07-23 LAB — CMP14+EGFR
ALT: 20 IU/L (ref 0–32)
AST: 23 IU/L (ref 0–40)
Albumin/Globulin Ratio: 1.7 (ref 1.2–2.2)
Albumin: 4.3 g/dL (ref 3.8–4.9)
Alkaline Phosphatase: 95 IU/L (ref 44–121)
BUN/Creatinine Ratio: 15 (ref 9–23)
BUN: 13 mg/dL (ref 6–24)
Bilirubin Total: <0.2 mg/dL (ref 0.0–1.2)
CO2: 25 mmol/L (ref 20–29)
Calcium: 9.5 mg/dL (ref 8.7–10.2)
Chloride: 101 mmol/L (ref 96–106)
Creatinine, Ser: 0.84 mg/dL (ref 0.57–1.00)
Globulin, Total: 2.5 g/dL (ref 1.5–4.5)
Glucose: 105 mg/dL — ABNORMAL HIGH (ref 70–99)
Potassium: 4.1 mmol/L (ref 3.5–5.2)
Sodium: 140 mmol/L (ref 134–144)
Total Protein: 6.8 g/dL (ref 6.0–8.5)
eGFR: 81 mL/min/{1.73_m2} (ref >59)

## 2021-07-23 LAB — LIPID PANEL
Chol/HDL Ratio: 2.1 ratio (ref 0.0–4.4)
Cholesterol, Total: 147 mg/dL (ref 100–199)
HDL: 70 mg/dL (ref >39)
LDL Chol Calc (NIH): 62 mg/dL (ref 0–99)
Triglycerides: 77 mg/dL (ref 0–149)
VLDL Cholesterol Cal: 15 mg/dL (ref 5–40)

## 2021-07-23 LAB — VITAMIN B12: Vitamin B-12: 1217 pg/mL (ref 232–1245)

## 2021-07-23 NOTE — Progress Notes (Signed)
Hello Sabryna,  Your lab result is normal and/or stable.Some minor variations that are not significant are commonly marked abnormal, but do not represent any medical problem for you.  Best regards, Bernarda Erck, M.D.

## 2021-10-07 ENCOUNTER — Ambulatory Visit: Payer: Medicare Other | Admitting: Nurse Practitioner

## 2021-10-28 ENCOUNTER — Ambulatory Visit (INDEPENDENT_AMBULATORY_CARE_PROVIDER_SITE_OTHER): Payer: Medicare Other | Admitting: Nurse Practitioner

## 2021-10-28 ENCOUNTER — Ambulatory Visit (HOSPITAL_COMMUNITY)
Admission: RE | Admit: 2021-10-28 | Discharge: 2021-10-28 | Disposition: A | Discharge status: Home / Self Care | Payer: Medicare Other | Source: Ambulatory Visit | Attending: Nurse Practitioner | Admitting: Nurse Practitioner

## 2021-10-28 ENCOUNTER — Encounter: Payer: Self-pay | Admitting: Nurse Practitioner

## 2021-10-28 VITALS — BP 120/75 | HR 68 | Temp 97.3°F | Ht 67.0 in | Wt 124.2 lb

## 2021-10-28 DIAGNOSIS — M8580 Other specified disorders of bone density and structure, unspecified site: Secondary | ICD-10-CM | POA: Diagnosis not present

## 2021-10-28 DIAGNOSIS — M25562 Pain in left knee: Secondary | ICD-10-CM | POA: Diagnosis not present

## 2021-10-28 DIAGNOSIS — M1712 Unilateral primary osteoarthritis, left knee: Secondary | ICD-10-CM | POA: Diagnosis not present

## 2021-10-28 DIAGNOSIS — M76892 Other specified enthesopathies of left lower limb, excluding foot: Secondary | ICD-10-CM | POA: Diagnosis not present

## 2021-10-28 DIAGNOSIS — M25552 Pain in left hip: Secondary | ICD-10-CM | POA: Insufficient documentation

## 2021-10-28 DIAGNOSIS — R5383 Other fatigue: Secondary | ICD-10-CM | POA: Diagnosis not present

## 2021-10-28 NOTE — Progress Notes (Signed)
 Subjective:    Patient ID: Shannon Castaneda, female    DOB: 10/02/1965, 55 y.o.   MRN: [id number]  HPI  New pt to practice to establish care.    Patient has a increasing fatigue over the past 4 to 5 months.  Patient states that she has been getting folate shots which seemed to help with her fatigue.  Last folate level was normal at  1,217.  Patient's last B12 shot was approximately 3 months ago.  Patient denies any weight loss.  Patient also complains of left leg pain around her left hip and left knee.  Patient states when she lies in bed it hurts and when she is standing on it it hurts.  Patient states that she can feel her knees popping.  Patient states that she takes Tylenol for pain relief which helps at times.  Patient denies any injuries.  Patient admits to history of osteopenia.  Patient has no other concerns.  Review of Systems  Constitutional:  Positive for fatigue.  Musculoskeletal:        Knee pain and hip pain  All other systems reviewed and are negative.      Objective:   Physical Exam Vitals reviewed.  Constitutional:      General: She is not in acute distress.    Appearance: Normal appearance. She is normal weight. She is not ill-appearing, toxic-appearing or diaphoretic.  HENT:     Head: Normocephalic and atraumatic.  Cardiovascular:     Rate and Rhythm: Normal rate and regular rhythm.     Pulses: Normal pulses.     Heart sounds: Normal heart sounds. No murmur heard. Pulmonary:     Effort: Pulmonary effort is normal. No respiratory distress.     Breath sounds: Normal breath sounds. No wheezing.  Abdominal:     General: Abdomen is flat. Bowel sounds are normal. There is no distension.     Palpations: Abdomen is soft. There is no mass.     Tenderness: There is no abdominal tenderness. There is no guarding or rebound.     Hernia: No hernia is present.  Musculoskeletal:     Comments: grossly intact.  Patient able to ambulate without difficulty.  Skin:     General: Skin is warm.     Capillary Refill: Capillary refill takes less than 2 seconds.  Neurological:     Mental Status: She is alert.     Comments: Grossly intact  Psychiatric:        Mood and Affect: Mood normal.        Behavior: Behavior normal.        Assessment & Plan:   1. Pain of left hip -Unsure of etiology however with patient history of osteopenia we will get x-ray to rule out hairline fracture - DG Hip Unilat W OR W/O Pelvis 1V Left  2. Acute pain of left knee -Unsure of etiology however with patient history of osteopenia we will get x-ray to rule out hairline fracture - DG Knee Complete 4 Views Left  3. Fatigue, unspecified type -B12 seem to be normal 3 months ago.  Will have B12 and folate panel checked again. -We will also evaluate for anemia, thyroid etiologies, and evaluate liver and kidneys - B12 and Folate Panel - CBC with Differential - TSH + free T4 - CMP14+EGFR -Return to clinic in 4 weeks  4. Osteopenia, unspecified location -Last bone density scan done February 2021. -We will have repeat bone density scan - DG Bone Density      Note:  This document was prepared using Dragon voice recognition software and may include unintentional dictation errors. Note - This record has been created using [employer].  Chart creation errors have been sought, but may not always  have been located. Such creation errors do not reflect on  the standard of medical care.

## 2021-10-29 LAB — CMP14+EGFR
ALT: 16 IU/L (ref 0–32)
AST: 21 IU/L (ref 0–40)
Albumin/Globulin Ratio: 1.7 (ref 1.2–2.2)
Albumin: 5 g/dL — ABNORMAL HIGH (ref 3.8–4.9)
Alkaline Phosphatase: 86 IU/L (ref 44–121)
BUN/Creatinine Ratio: 10 (ref 9–23)
BUN: 8 mg/dL (ref 6–24)
Bilirubin Total: <0.2 mg/dL (ref 0.0–1.2)
CO2: 18 mmol/L — ABNORMAL LOW (ref 20–29)
Calcium: 9.8 mg/dL (ref 8.7–10.2)
Chloride: 101 mmol/L (ref 96–106)
Creatinine, Ser: 0.8 mg/dL (ref 0.57–1.00)
Globulin, Total: 3 g/dL (ref 1.5–4.5)
Glucose: 92 mg/dL (ref 70–99)
Potassium: 4.5 mmol/L (ref 3.5–5.2)
Sodium: 141 mmol/L (ref 134–144)
Total Protein: 8 g/dL (ref 6.0–8.5)
eGFR: 86 mL/min/{1.73_m2} (ref >59)

## 2021-10-29 LAB — CBC WITH DIFFERENTIAL/PLATELET
Basophils Absolute: 0.1 10*3/uL (ref 0.0–0.2)
Basos: 1 %
EOS (ABSOLUTE): 0.4 10*3/uL (ref 0.0–0.4)
Eos: 4 %
Hematocrit: 35.9 % (ref 34.0–46.6)
Hemoglobin: 12.1 g/dL (ref 11.1–15.9)
Immature Grans (Abs): 0 10*3/uL (ref 0.0–0.1)
Immature Granulocytes: 0 %
Lymphocytes Absolute: 3.2 10*3/uL — ABNORMAL HIGH (ref 0.7–3.1)
Lymphs: 37 %
MCH: 29.7 pg (ref 26.6–33.0)
MCHC: 33.7 g/dL (ref 31.5–35.7)
MCV: 88 fL (ref 79–97)
Monocytes Absolute: 0.7 10*3/uL (ref 0.1–0.9)
Monocytes: 8 %
Neutrophils Absolute: 4.3 10*3/uL (ref 1.4–7.0)
Neutrophils: 50 %
Platelets: 251 10*3/uL (ref 150–450)
RBC: 4.07 x10E6/uL (ref 3.77–5.28)
RDW: 13.1 % (ref 11.7–15.4)
WBC: 8.7 10*3/uL (ref 3.4–10.8)

## 2021-10-29 LAB — B12 AND FOLATE PANEL
Folate: >20 ng/mL (ref >3.0)
Vitamin B-12: >2000 pg/mL — ABNORMAL HIGH (ref 232–1245)

## 2021-10-29 LAB — TSH+FREE T4
Free T4: 1.04 ng/dL (ref 0.82–1.77)
TSH: 3.58 u[IU]/mL (ref 0.450–4.500)

## 2021-11-10 ENCOUNTER — Other Ambulatory Visit (HOSPITAL_COMMUNITY): Payer: Medicare Other

## 2021-11-21 ENCOUNTER — Other Ambulatory Visit (HOSPITAL_COMMUNITY): Payer: Medicare Other

## 2021-11-25 ENCOUNTER — Ambulatory Visit: Payer: Medicare Other | Admitting: Nurse Practitioner

## 2021-12-08 ENCOUNTER — Other Ambulatory Visit (HOSPITAL_COMMUNITY): Payer: Medicare Other

## 2021-12-15 ENCOUNTER — Ambulatory Visit (INDEPENDENT_AMBULATORY_CARE_PROVIDER_SITE_OTHER): Payer: Medicare Other | Admitting: Nurse Practitioner

## 2021-12-15 ENCOUNTER — Encounter: Payer: Self-pay | Admitting: Nurse Practitioner

## 2021-12-15 ENCOUNTER — Ambulatory Visit (HOSPITAL_COMMUNITY)
Admission: RE | Admit: 2021-12-15 | Discharge: 2021-12-15 | Disposition: A | Discharge status: Home / Self Care | Payer: Medicare Other | Source: Ambulatory Visit | Attending: Nurse Practitioner | Admitting: Nurse Practitioner

## 2021-12-15 VITALS — BP 118/78 | Ht 67.0 in | Wt 123.4 lb

## 2021-12-15 DIAGNOSIS — R7303 Prediabetes: Secondary | ICD-10-CM | POA: Diagnosis not present

## 2021-12-15 DIAGNOSIS — Z13820 Encounter for screening for osteoporosis: Secondary | ICD-10-CM | POA: Insufficient documentation

## 2021-12-15 DIAGNOSIS — R718 Other abnormality of red blood cells: Secondary | ICD-10-CM | POA: Diagnosis not present

## 2021-12-15 DIAGNOSIS — M25562 Pain in left knee: Secondary | ICD-10-CM | POA: Diagnosis not present

## 2021-12-15 DIAGNOSIS — M25552 Pain in left hip: Secondary | ICD-10-CM | POA: Diagnosis not present

## 2021-12-15 DIAGNOSIS — Z780 Asymptomatic menopausal state: Secondary | ICD-10-CM | POA: Insufficient documentation

## 2021-12-15 DIAGNOSIS — K219 Gastro-esophageal reflux disease without esophagitis: Secondary | ICD-10-CM

## 2021-12-15 DIAGNOSIS — R631 Polydipsia: Secondary | ICD-10-CM

## 2021-12-15 DIAGNOSIS — M8580 Other specified disorders of bone density and structure, unspecified site: Secondary | ICD-10-CM | POA: Insufficient documentation

## 2021-12-15 DIAGNOSIS — M8589 Other specified disorders of bone density and structure, multiple sites: Secondary | ICD-10-CM | POA: Insufficient documentation

## 2021-12-15 DIAGNOSIS — R5382 Chronic fatigue, unspecified: Secondary | ICD-10-CM

## 2021-12-15 MED ORDER — PANTOPRAZOLE SODIUM 40 MG PO TBEC: 40.0000 mg | DELAYED_RELEASE_TABLET | Freq: Every day | ORAL | 3 refills | Status: DC

## 2021-12-15 MED ORDER — DICLOFENAC SODIUM 1 % EX GEL: 2.0000 g | Freq: Four times a day (QID) | CUTANEOUS | 1 refills | Status: DC

## 2021-12-15 NOTE — Progress Notes (Signed)
Subjective:    Patient ID: Shannon Castaneda, female    DOB: [birth year], 56 y.o.   MRN: [id number]  HPI  Patient arrives for a follow up on left knee and hip pain. Patient states the pain is still about the same.  Patient states that she does not use any ibuprofen products because of her acid reflux.  Patient states that he had any hurt worse with occasional walking and during squatting motion.  Patient states she needs increase on Reflux meds if possible- current med not helping much.  Patient also states that she has a lot of fatigue.  Patient was taking B12 vitamin supplements however B12 levels were found to be normal.  Patient states that despite having a normal B12 level she still feels exhausted.  Patient states that on good nights she sleeps about 6 to 7 hours per night and on a bad night she sleeps about 3 to 4 hours per night.  Patient denies any anxious thoughts, mind racing, or early.  Patient also denies that pain is keeping her up at night.  Patient concerned about her blood sugar.  Patient states that she often times feels very thirsty and sometimes experiences frequent urination that comes and goes.  Patient would like to know if blood sugar may be causing her chronic fatigue.   Review of Systems  Constitutional:  Positive for fatigue.  Endocrine: Positive for polydipsia and polyuria.  Musculoskeletal:        Left hip pain and left knee pain.       Objective:   Physical Exam Vitals reviewed.  Constitutional:      General: She is not in acute distress.    Appearance: Normal appearance. She is normal weight. She is not ill-appearing, toxic-appearing or diaphoretic.  HENT:     Head: Normocephalic and atraumatic.  Cardiovascular:     Rate and Rhythm: Normal rate and regular rhythm.     Pulses: Normal pulses.     Heart sounds: Normal heart sounds. No murmur heard. Pulmonary:     Effort: Pulmonary effort is normal. No respiratory distress.     Breath sounds: Normal  breath sounds. No wheezing.  Musculoskeletal:     Right hip: Normal.     Left hip: No deformity, lacerations, tenderness, bony tenderness or crepitus. Normal range of motion. Normal strength.     Right knee: Normal.     Left knee: No swelling, deformity, effusion, erythema, ecchymosis, lacerations, bony tenderness or crepitus. Normal range of motion. No tenderness. No LCL laxity, MCL laxity, ACL laxity or PCL laxity.Normal alignment, normal meniscus and normal patellar mobility. Normal pulse.     Comments: Grossly intact.  No point tenderness to left hip or left knee on exam today.  Mild pain with movement. ROM intact. Strength 5/5 to left hip and left leg.   Skin:    General: Skin is warm.     Capillary Refill: Capillary refill takes less than 2 seconds.  Neurological:     Mental Status: She is alert.     Comments: Grossly intact  Psychiatric:        Mood and Affect: Mood normal.        Behavior: Behavior normal.         Assessment & Plan:   1. Pain of left hip joint -Pain likely muscular fascia in nature -Recent x-ray showed no hairline fractures or other abnormalities -We will trial patient on Voltaren gel due to her sensitivity to NSAIDs and do  referral to orthopedic specialist - diclofenac Sodium (VOLTAREN) 1 % GEL; Apply 2 g topically 4 (four) times daily.  Dispense: 100 g; Refill: 1 - Ambulatory referral to Orthopedic Surgery -Return to clinic 3 months  2. Acute pain of left knee -Patient possibly related to bone spur or compartment joint spacing - We will trial patient on Voltaren gel due to her sensitivity to NSAIDs and do referral to orthopedic specialist - diclofenac Sodium (VOLTAREN) 1 % GEL; Apply 2 g topically 4 (four) times daily.  Dispense: 100 g; Refill: 1 - Ambulatory referral to Orthopedic Surgery -Return to clinic 3 months  3. Chronic fatigue -Thyroid and B12 labs all within normal limits -CBC showed mildly low red blood cells previously -We will evaluate  patient's iron level to rule out low ferritin - Iron, TIBC and Ferritin Panel -Return to clinic 2 months  4. Polydipsia -We will rule out diabetes with A1c - Hemoglobin A1c  5. Gastroesophageal reflux disease, unspecified whether esophagitis present -Patient to trial Protonix to see if that helps with GERD symptoms -Stop Rabeprazole and start protonix for GERD management - pantoprazole (PROTONIX) 40 MG tablet; Take 1 tablet (40 mg total) by mouth daily.  Dispense: 30 tablet; Refill: 3 -Return to clinic 3 months    Note:  This document was prepared using Dragon voice recognition software and may include unintentional dictation errors. Note - This record has been created using [employer].  Chart creation errors have been sought, but may not always  have been located. Such creation errors do not reflect on  the standard of medical care.

## 2021-12-16 LAB — HEMOGLOBIN A1C
Est. average glucose Bld gHb Est-mCnc: 123 mg/dL
Hgb A1c MFr Bld: 5.9 % — ABNORMAL HIGH (ref 4.8–5.6)

## 2021-12-16 LAB — IRON,TIBC AND FERRITIN PANEL
Ferritin: 68 ng/mL (ref 15–150)
Iron Saturation: 21 % (ref 15–55)
Iron: 87 ug/dL (ref 27–159)
Total Iron Binding Capacity: 409 ug/dL (ref 250–450)
UIBC: 322 ug/dL (ref 131–425)

## 2021-12-29 ENCOUNTER — Ambulatory Visit: Payer: Medicare Other | Admitting: Nurse Practitioner

## 2022-01-01 ENCOUNTER — Ambulatory Visit (INDEPENDENT_AMBULATORY_CARE_PROVIDER_SITE_OTHER): Payer: Medicare Other | Admitting: Nurse Practitioner

## 2022-01-01 ENCOUNTER — Encounter: Payer: Self-pay | Admitting: Nurse Practitioner

## 2022-01-01 VITALS — BP 116/70 | HR 75 | Ht 67.0 in | Wt 123.2 lb

## 2022-01-01 DIAGNOSIS — E559 Vitamin D deficiency, unspecified: Secondary | ICD-10-CM | POA: Diagnosis not present

## 2022-01-01 DIAGNOSIS — M8580 Other specified disorders of bone density and structure, unspecified site: Secondary | ICD-10-CM | POA: Diagnosis not present

## 2022-01-01 MED ORDER — ALENDRONATE SODIUM 70 MG PO TABS: 70.0000 mg | ORAL_TABLET | ORAL | 11 refills | Status: DC

## 2022-01-01 NOTE — Progress Notes (Signed)
   Subjective:    Patient ID: Shannon Castaneda, female    DOB: 09/03/1965, 56 y.o.   MRN: [id number]  HPI   Patient here to discuss dexa scan results. Patient states that used to take Fosamax and an injection for bone loss in the past. She does know why she stopped. She thinks the provider told her that she didn't need it any more.   Review of Systems  All other systems reviewed and are negative.      Objective:   Physical Exam Vitals reviewed.  Constitutional:      General: She is not in acute distress.    Appearance: Normal appearance. She is normal weight. She is not ill-appearing, toxic-appearing or diaphoretic.  HENT:     Head: Normocephalic and atraumatic.  Cardiovascular:     Rate and Rhythm: Normal rate and regular rhythm.     Pulses: Normal pulses.     Heart sounds: Normal heart sounds. No murmur heard. Pulmonary:     Effort: Pulmonary effort is normal. No respiratory distress.     Breath sounds: Normal breath sounds. No wheezing.  Musculoskeletal:     Comments: Grossly intact  Skin:    General: Skin is warm.     Capillary Refill: Capillary refill takes less than 2 seconds.  Neurological:     Mental Status: She is alert.     Comments: Grossly intact  Psychiatric:        Mood and Affect: Mood normal.        Behavior: Behavior normal.           Assessment & Plan:   1. Osteopenia, unspecified location - Patient appears to have progressing osteopenia compared previous dexa scans. - Believe that patient would benefit from taking biphosphatase.  - Will start patient on Fosamax.  - Discussed how to take fosamax in detail. Patient also provided with handout. - alendronate (FOSAMAX) 70 MG tablet; Take 1 tablet (70 mg total) by mouth every 7 (seven) days. Take with a full glass of water on an empty stomach.  Dispense: 4 tablet; Refill: 11 - Vitamin D (25 hydroxy) - RTC in October or sooner if needed    2. Vitamin D deficiency - Vitamin D (25 hydroxy)

## 2022-01-02 ENCOUNTER — Ambulatory Visit (INDEPENDENT_AMBULATORY_CARE_PROVIDER_SITE_OTHER): Payer: Medicare Other

## 2022-01-02 ENCOUNTER — Encounter: Payer: Self-pay | Admitting: Orthopedic Surgery

## 2022-01-02 ENCOUNTER — Ambulatory Visit (INDEPENDENT_AMBULATORY_CARE_PROVIDER_SITE_OTHER): Payer: Medicare Other | Admitting: Orthopedic Surgery

## 2022-01-02 VITALS — BP 127/66 | HR 65 | Ht 67.0 in | Wt 122.0 lb

## 2022-01-02 DIAGNOSIS — M25552 Pain in left hip: Secondary | ICD-10-CM | POA: Diagnosis not present

## 2022-01-02 DIAGNOSIS — M5416 Radiculopathy, lumbar region: Secondary | ICD-10-CM | POA: Diagnosis not present

## 2022-01-02 LAB — VITAMIN D 25 HYDROXY (VIT D DEFICIENCY, FRACTURES): Vit D, 25-Hydroxy: 42.4 ng/mL (ref 30.0–100.0)

## 2022-01-02 MED ORDER — PREDNISONE 10 MG (21) PO TBPK: ORAL_TABLET | ORAL | 0 refills | Status: DC

## 2022-01-02 NOTE — Patient Instructions (Signed)

## 2022-01-03 ENCOUNTER — Encounter: Payer: Self-pay | Admitting: Orthopedic Surgery

## 2022-01-03 NOTE — Progress Notes (Signed)
New Patient Visit  Assessment: Shannon Castaneda is a 56 y.o. female with the following: 1. Pain of left hip 2. Radiculopathy, lumbar region  Plan: Shannon Castaneda has pain in the left buttocks.  Minimal radiating pain.  This has been ongoing for 2 years.  Low concern for nerve compression at this time.  Recommend prednisone and home exercises.  She is not interested in PT.  Follow up as needed.   Follow-up: Return if symptoms worsen or fail to improve.  Subjective:  Chief Complaint  Patient presents with   Hip Pain    Pt with pain starting in the glut area going into the knee  for a 2 yrs.    History of Present Illness: Shannon Castaneda is a 56 y.o. female who has been referred by  Alvis Lemmings, NP for evaluation of Left hip pain.  She has had pain in the left buttock for about 2 years.  No specific injury.  Not consistent radiating pain.  Occasional medications.  No therapy.    Review of Systems: No fevers or chills No numbness or tingling No chest pain No shortness of breath No bowel or bladder dysfunction No GI distress No headaches   Medical History:  Past Medical History:  Diagnosis Date   Blood dyscrasia    "free bleeder "   Degeneration of lumbar or lumbosacral intervertebral disc 03/17/2016   GERD (gastroesophageal reflux disease)    Hyperlipidemia    Osteoporosis    Vitamin D deficiency     Past Surgical History:  Procedure Laterality Date   MULTIPLE EXTRACTIONS WITH ALVEOLOPLASTY  07/27/2011   Procedure: MULTIPLE EXTRACION WITH ALVEOLOPLASTY;  Surgeon: Georgia Lopes, DDS;  Location: [facility] OR;  Service: Oral Surgery;  Laterality: Bilateral;  Insertion of denture.    Family History  Problem Relation Age of Onset   Heart disease Mother    Cancer Brother        lung   Social History   Tobacco Use   Smoking status: Never   Smokeless tobacco: Never  Vaping Use   Vaping Use: Never used  Substance Use Topics   Alcohol use: No   Drug use: No     No Known Allergies  Current Meds  Medication Sig   alendronate (FOSAMAX) 70 MG tablet Take 1 tablet (70 mg total) by mouth every 7 (seven) days. Take with a full glass of water on an empty stomach.   Calcium Carbonate-Vitamin D (CALTRATE 600+D PO) Take 1 tablet by mouth.   diclofenac Sodium (VOLTAREN) 1 % GEL Apply 2 g topically 4 (four) times daily.   pantoprazole (PROTONIX) 40 MG tablet Take 1 tablet (40 mg total) by mouth daily.   predniSONE (STERAPRED UNI-PAK 21 TAB) 10 MG (21) TBPK tablet 10 mg DS 12 as directed   rOPINIRole (REQUIP) 1 MG tablet TAKE 1 TABLET(1 MG) BY MOUTH THREE TIMES DAILY FOR LEG CRAMPS   rosuvastatin (CRESTOR) 10 MG tablet TAKE 1 TABLET(10 MG) BY MOUTH DAILY FOR CHOLESTEROL   Current Facility-Administered Medications for the 01/02/22 encounter (Office Visit) with Oliver Barre, MD  Medication   cyanocobalamin ((VITAMIN B-12)) injection 1,000 mcg    Objective: BP 127/66   Pulse 65   Ht 5\' 7"  (1.702 m)   Wt 122 lb (55.3 kg)   LMP 10/27/2010   BMI 19.11 kg/m   Physical Exam:  General: Alert and oriented. and No acute distress. Gait: Left sided antalgic gait.  Pain in left side of  lower back.  Tender within left buttock.  Negative straight leg raise.  Good strength bilateral lower extremity.  Sensation intact BLE.  Equivalent patellar tendon reflexes.  Toes are WWP    IMAGING: I personally ordered and reviewed the following images  Standing lumbar XR were obtained in clinic today.  No acute injuries.  No anterolisthesis.  Well maintained disc height.  Some sclerosis noted at L5-S1.  Minimal osteophytes.   Impression: negative Lumbar spine   New Medications:  Meds ordered this encounter  Medications   predniSONE (STERAPRED UNI-PAK 21 TAB) 10 MG (21) TBPK tablet    Sig: 10 mg DS 12 as directed    Dispense:  48 tablet    Refill:  0      Oliver Barre, MD  01/03/2022 10:02 PM

## 2022-01-07 ENCOUNTER — Other Ambulatory Visit: Payer: Self-pay | Admitting: Orthopedic Surgery

## 2022-01-26 ENCOUNTER — Ambulatory Visit: Payer: Medicare Other | Admitting: Family Medicine

## 2022-03-09 DIAGNOSIS — H25811 Combined forms of age-related cataract, right eye: Secondary | ICD-10-CM | POA: Diagnosis not present

## 2022-03-09 DIAGNOSIS — H25812 Combined forms of age-related cataract, left eye: Secondary | ICD-10-CM | POA: Diagnosis not present

## 2022-03-17 ENCOUNTER — Ambulatory Visit: Payer: Medicare Other | Admitting: Nurse Practitioner

## 2022-03-25 ENCOUNTER — Ambulatory Visit (INDEPENDENT_AMBULATORY_CARE_PROVIDER_SITE_OTHER): Payer: Medicare Other | Admitting: Nurse Practitioner

## 2022-03-25 ENCOUNTER — Encounter: Payer: Self-pay | Admitting: Nurse Practitioner

## 2022-03-25 VITALS — BP 124/68 | Temp 97.7°F | Ht 67.0 in | Wt 124.4 lb

## 2022-03-25 DIAGNOSIS — R5382 Chronic fatigue, unspecified: Secondary | ICD-10-CM

## 2022-03-25 DIAGNOSIS — E785 Hyperlipidemia, unspecified: Secondary | ICD-10-CM | POA: Diagnosis not present

## 2022-03-25 DIAGNOSIS — M25562 Pain in left knee: Secondary | ICD-10-CM | POA: Diagnosis not present

## 2022-03-25 DIAGNOSIS — M8580 Other specified disorders of bone density and structure, unspecified site: Secondary | ICD-10-CM | POA: Diagnosis not present

## 2022-03-25 MED ORDER — IBUPROFEN 600 MG PO TABS: 600.0000 mg | ORAL_TABLET | Freq: Three times a day (TID) | ORAL | 0 refills | Status: DC | PRN

## 2022-03-25 MED ORDER — PREDNISONE 10 MG PO TABS: 10.0000 mg | ORAL_TABLET | Freq: Every day | ORAL | 0 refills | Status: AC

## 2022-03-25 MED ORDER — ALENDRONATE SODIUM 70 MG PO TABS: 70.0000 mg | ORAL_TABLET | ORAL | 11 refills | Status: DC

## 2022-03-25 NOTE — Progress Notes (Signed)
   Subjective:    Patient ID: Shannon Castaneda, female    DOB: Aug 04, 1965, 56 y.o.   MRN: [id number]  Hyperlipidemia This is a chronic problem. The current episode started more than 1 year ago. Treatments tried: crestor. Risk factors for coronary artery disease include dyslipidemia and post-menopausal.   Patient reports continued Left leg pain. Patient seeing orthopedics.   Patient also complains of chronic fatigue.  Patient has no other complains or concerns.    Review of Systems  Constitutional:  Positive for fatigue.  Musculoskeletal:        Leg pain       Objective:   Physical Exam Vitals reviewed.  Constitutional:      General: She is not in acute distress.    Appearance: Normal appearance. She is normal weight. She is not ill-appearing, toxic-appearing or diaphoretic.  HENT:     Head: Normocephalic and atraumatic.  Cardiovascular:     Rate and Rhythm: Normal rate and regular rhythm.     Pulses: Normal pulses.     Heart sounds: Normal heart sounds. No murmur heard. Pulmonary:     Effort: Pulmonary effort is normal. No respiratory distress.     Breath sounds: Normal breath sounds. No wheezing.  Musculoskeletal:     Comments: Grossly intact. No swelling noted to left leg. No point tenderness.   Skin:    General: Skin is warm.     Capillary Refill: Capillary refill takes less than 2 seconds.  Neurological:     Mental Status: She is alert.     Comments: Grossly intact  Psychiatric:        Mood and Affect: Mood normal.        Behavior: Behavior normal.           Assessment & Plan:  1. Osteopenia, unspecified location - Refill - alendronate (FOSAMAX) 70 MG tablet; Take 1 tablet (70 mg total) by mouth every 7 (seven) days. Take with a full glass of water on an empty stomach.  Dispense: 4 tablet; Refill: 11  2. Acute pain of left knee - Refill - predniSONE (DELTASONE) 10 MG tablet; Take 1 tablet (10 mg total) by mouth daily with breakfast for 10 days.   Dispense: 10 tablet; Refill: 0 - ibuprofen (ADVIL) 600 MG tablet; Take 1 tablet (600 mg total) by mouth every 8 (eight) hours as needed.  Dispense: 30 tablet; Refill: 0 - Follow up with Ortho if pain persist.  3. Chronic fatigue - CBC with Differential/Platelet  4. Hyperlipidemia, unspecified hyperlipidemia type- -Continue taking Crestor as prescribe.  - CMP14+EGFR - Lipid Profile

## 2022-03-26 DIAGNOSIS — H25812 Combined forms of age-related cataract, left eye: Secondary | ICD-10-CM | POA: Diagnosis not present

## 2022-03-26 DIAGNOSIS — H269 Unspecified cataract: Secondary | ICD-10-CM | POA: Diagnosis not present

## 2022-03-26 LAB — CMP14+EGFR
ALT: 40 IU/L — ABNORMAL HIGH (ref 0–32)
AST: 32 IU/L (ref 0–40)
Albumin/Globulin Ratio: 1.9 (ref 1.2–2.2)
Albumin: 5 g/dL — ABNORMAL HIGH (ref 3.8–4.9)
Alkaline Phosphatase: 110 IU/L (ref 44–121)
BUN/Creatinine Ratio: 12 (ref 9–23)
BUN: 10 mg/dL (ref 6–24)
Bilirubin Total: 0.2 mg/dL (ref 0.0–1.2)
CO2: 23 mmol/L (ref 20–29)
Calcium: 10 mg/dL (ref 8.7–10.2)
Chloride: 99 mmol/L (ref 96–106)
Creatinine, Ser: 0.83 mg/dL (ref 0.57–1.00)
Globulin, Total: 2.6 g/dL (ref 1.5–4.5)
Glucose: 94 mg/dL (ref 70–99)
Potassium: 4.6 mmol/L (ref 3.5–5.2)
Sodium: 138 mmol/L (ref 134–144)
Total Protein: 7.6 g/dL (ref 6.0–8.5)
eGFR: 82 mL/min/{1.73_m2} (ref >59)

## 2022-03-26 LAB — CBC WITH DIFFERENTIAL/PLATELET
Basophils Absolute: 0 x10E3/uL (ref 0.0–0.2)
Basos: 1 %
EOS (ABSOLUTE): 0.2 x10E3/uL (ref 0.0–0.4)
Eos: 2 %
Hematocrit: 39.8 % (ref 34.0–46.6)
Hemoglobin: 13.1 g/dL (ref 11.1–15.9)
Immature Grans (Abs): 0 x10E3/uL (ref 0.0–0.1)
Immature Granulocytes: 0 %
Lymphocytes Absolute: 2.7 x10E3/uL (ref 0.7–3.1)
Lymphs: 44 %
MCH: 29.8 pg (ref 26.6–33.0)
MCHC: 32.9 g/dL (ref 31.5–35.7)
MCV: 91 fL (ref 79–97)
Monocytes Absolute: 0.4 x10E3/uL (ref 0.1–0.9)
Monocytes: 7 %
Neutrophils Absolute: 2.8 x10E3/uL (ref 1.4–7.0)
Neutrophils: 46 %
Platelets: 288 x10E3/uL (ref 150–450)
RBC: 4.4 x10E6/uL (ref 3.77–5.28)
RDW: 13.1 % (ref 11.7–15.4)
WBC: 6.1 x10E3/uL (ref 3.4–10.8)

## 2022-03-26 LAB — LIPID PANEL
Chol/HDL Ratio: 3.1 ratio (ref 0.0–4.4)
Cholesterol, Total: 250 mg/dL — ABNORMAL HIGH (ref 100–199)
HDL: 80 mg/dL (ref >39)
LDL Chol Calc (NIH): 149 mg/dL — ABNORMAL HIGH (ref 0–99)
Triglycerides: 119 mg/dL (ref 0–149)
VLDL Cholesterol Cal: 21 mg/dL (ref 5–40)

## 2022-04-01 ENCOUNTER — Encounter: Payer: Self-pay | Admitting: Nurse Practitioner

## 2022-04-23 ENCOUNTER — Other Ambulatory Visit: Payer: Self-pay | Admitting: Nurse Practitioner

## 2022-04-23 DIAGNOSIS — K219 Gastro-esophageal reflux disease without esophagitis: Secondary | ICD-10-CM

## 2022-04-30 DIAGNOSIS — H25811 Combined forms of age-related cataract, right eye: Secondary | ICD-10-CM | POA: Diagnosis not present

## 2022-04-30 DIAGNOSIS — H269 Unspecified cataract: Secondary | ICD-10-CM | POA: Diagnosis not present

## 2022-05-15 ENCOUNTER — Ambulatory Visit (INDEPENDENT_AMBULATORY_CARE_PROVIDER_SITE_OTHER): Payer: Medicare Other

## 2022-05-15 VITALS — Ht 67.0 in | Wt 124.0 lb

## 2022-05-15 DIAGNOSIS — Z0000 Encounter for general adult medical examination without abnormal findings: Secondary | ICD-10-CM

## 2022-05-15 NOTE — Patient Instructions (Addendum)
Shannon Castaneda , Thank you for taking time to come for your Medicare Wellness Visit. I appreciate your ongoing commitment to your health goals. Please review the following plan we discussed and let me know if I can assist you in the future.   These are the goals we discussed:  Goals      Increase water intake     Prevent falls        This is a list of the screening recommended for you and due dates:  Health Maintenance  Topic Date Due   Pap Smear  05/15/2022*   COVID-19 Vaccine (3 - Pfizer risk series) 05/31/2022*   Flu Shot  08/16/2022*   Mammogram  04/03/2023*   Colon Cancer Screening  04/03/2023*   Medicare Annual Wellness Visit  05/16/2023   Hepatitis C Screening: USPSTF Recommendation to screen - Ages 18-79 yo.  Completed   HIV Screening  Completed   Zoster (Shingles) Vaccine  Completed   HPV Vaccine  Aged Out   DTaP/Tdap/Td vaccine  Discontinued  *Topic was postponed. The date shown is not the original due date.    Advanced directives: Advance directive discussed with you today. Please call our office should you want to complete and we can give you the proper paperwork for you to fill out.   Conditions/risks identified: Aim for 30 minutes of exercise or brisk walking, 6-8 glasses of water, and 5 servings of fruits and vegetables each day.   Next appointment: Follow up in one year for your annual wellness visit.   Preventive Care 40-64 Years, Female Preventive care refers to lifestyle choices and visits with your health care provider that can promote health and wellness. What does preventive care include? A yearly physical exam. This is also called an annual well check. Dental exams once or twice a year. Routine eye exams. Ask your health care provider how often you should have your eyes checked. Personal lifestyle choices, including: Daily care of your teeth and gums. Regular physical activity. Eating a healthy diet. Avoiding tobacco and drug use. Limiting alcohol  use. Practicing safe sex. Taking low-dose aspirin daily starting at age 48. Taking vitamin and mineral supplements as recommended by your health care provider. What happens during an annual well check? The services and screenings done by your health care provider during your annual well check will depend on your age, overall health, lifestyle risk factors, and family history of disease. Counseling  Your health care provider may ask you questions about your: Alcohol use. Tobacco use. Drug use. Emotional well-being. Home and relationship well-being. Sexual activity. Eating habits. Work and work Statistician. Method of birth control. Menstrual cycle. Pregnancy history. Screening  You may have the following tests or measurements: Height, weight, and BMI. Blood pressure. Lipid and cholesterol levels. These may be checked every 5 years, or more frequently if you are over 33 years old. Skin check. Lung cancer screening. You may have this screening every year starting at age 29 if you have a 30-pack-year history of smoking and currently smoke or have quit within the past 15 years. Fecal occult blood test (FOBT) of the stool. You may have this test every year starting at age 51. Flexible sigmoidoscopy or colonoscopy. You may have a sigmoidoscopy every 5 years or a colonoscopy every 10 years starting at age 76. Hepatitis C blood test. Hepatitis B blood test. Sexually transmitted disease (STD) testing. Diabetes screening. This is done by checking your blood sugar (glucose) after you have not eaten for a  while (fasting). You may have this done every 1-3 years. Mammogram. This may be done every 1-2 years. Talk to your health care provider about when you should start having regular mammograms. This may depend on whether you have a family history of breast cancer. BRCA-related cancer screening. This may be done if you have a family history of breast, ovarian, tubal, or peritoneal cancers. Pelvic  exam and Pap test. This may be done every 3 years starting at age 41. Starting at age 47, this may be done every 5 years if you have a Pap test in combination with an HPV test. Bone density scan. This is done to screen for osteoporosis. You may have this scan if you are at high risk for osteoporosis. Discuss your test results, treatment options, and if necessary, the need for more tests with your health care provider. Vaccines  Your health care provider may recommend certain vaccines, such as: Influenza vaccine. This is recommended every year. Tetanus, diphtheria, and acellular pertussis (Tdap, Td) vaccine. You may need a Td booster every 10 years. Zoster vaccine. You may need this after age 15. Pneumococcal 13-valent conjugate (PCV13) vaccine. You may need this if you have certain conditions and were not previously vaccinated. Pneumococcal polysaccharide (PPSV23) vaccine. You may need one or two doses if you smoke cigarettes or if you have certain conditions. Talk to your health care provider about which screenings and vaccines you need and how often you need them. This information is not intended to replace advice given to you by your health care provider. Make sure you discuss any questions you have with your health care provider. Document Released: 05/31/2015 Document Revised: 01/22/2016 Document Reviewed: 03/05/2015 Elsevier Interactive Patient Education  2017 [place] Prevention in the Home Falls can cause injuries. They can happen to people of all ages. There are many things you can do to make your home safe and to help prevent falls. What can I do on the outside of my home? Regularly fix the edges of walkways and driveways and fix any cracks. Remove anything that might make you trip as you walk through a door, such as a raised step or threshold. Trim any bushes or trees on the path to your home. Use bright outdoor lighting. Clear any walking paths of anything that might  make someone trip, such as rocks or tools. Regularly check to see if handrails are loose or broken. Make sure that both sides of any steps have handrails. Any raised decks and porches should have guardrails on the edges. Have any leaves, snow, or ice cleared regularly. Use sand or salt on walking paths during winter. Clean up any spills in your garage right away. This includes oil or grease spills. What can I do in the bathroom? Use night lights. Install grab bars by the toilet and in the tub and shower. Do not use towel bars as grab bars. Use non-skid mats or decals in the tub or shower. If you need to sit down in the shower, use a plastic, non-slip stool. Keep the floor dry. Clean up any water that spills on the floor as soon as it happens. Remove soap buildup in the tub or shower regularly. Attach bath mats securely with double-sided non-slip rug tape. Do not have throw rugs and other things on the floor that can make you trip. What can I do in the bedroom? Use night lights. Make sure that you have a light by your bed that is easy  to reach. Do not use any sheets or blankets that are too big for your bed. They should not hang down onto the floor. Have a firm chair that has side arms. You can use this for support while you get dressed. Do not have throw rugs and other things on the floor that can make you trip. What can I do in the kitchen? Clean up any spills right away. Avoid walking on wet floors. Keep items that you use a lot in easy-to-reach places. If you need to reach something above you, use a strong step stool that has a grab bar. Keep electrical cords out of the way. Do not use floor polish or wax that makes floors slippery. If you must use wax, use non-skid floor wax. Do not have throw rugs and other things on the floor that can make you trip. What can I do with my stairs? Do not leave any items on the stairs. Make sure that there are handrails on both sides of the stairs  and use them. Fix handrails that are broken or loose. Make sure that handrails are as long as the stairways. Check any carpeting to make sure that it is firmly attached to the stairs. Fix any carpet that is loose or worn. Avoid having throw rugs at the top or bottom of the stairs. If you do have throw rugs, attach them to the floor with carpet tape. Make sure that you have a light switch at the top of the stairs and the bottom of the stairs. If you do not have them, ask someone to add them for you. What else can I do to help prevent falls? Wear shoes that: Do not have high heels. Have rubber bottoms. Are comfortable and fit you well. Are closed at the toe. Do not wear sandals. If you use a stepladder: Make sure that it is fully opened. Do not climb a closed stepladder. Make sure that both sides of the stepladder are locked into place. Ask someone to hold it for you, if possible. Clearly mark and make sure that you can see: Any grab bars or handrails. First and last steps. Where the edge of each step is. Use tools that help you move around (mobility aids) if they are needed. These include: Canes. Walkers. Scooters. Crutches. Turn on the lights when you go into a dark area. Replace any light bulbs as soon as they burn out. Set up your furniture so you have a clear path. Avoid moving your furniture around. If any of your floors are uneven, fix them. If there are any pets around you, be aware of where they are. Review your medicines with your doctor. Some medicines can make you feel dizzy. This can increase your chance of falling. Ask your doctor what other things that you can do to help prevent falls. This information is not intended to replace advice given to you by your health care provider. Make sure you discuss any questions you have with your health care provider. Document Released: 02/28/2009 Document Revised: 10/10/2015 Document Reviewed: 06/08/2014 Elsevier Interactive Patient  Education  2017 [employer].

## 2022-05-15 NOTE — Progress Notes (Signed)
Subjective:   Shannon Castaneda is a 56 y.o. female who presents for Medicare Annual (Subsequent) preventive examination.  I connected with  BOOTS MCGLOWN on 05/15/22 by a audio enabled telemedicine application and verified that I am speaking with the correct person using two identifiers.  Patient Location: Home  Provider Location: Office/Clinic  I discussed the limitations of evaluation and management by telemedicine. The patient expressed understanding and agreed to proceed.  Review of Systems     Cardiac Risk Factors include: sedentary lifestyle;dyslipidemia     Objective:    Today's Vitals   05/15/22 1428  Weight: 124 lb (56.2 kg)  Height: 5\' 7"  (1.702 m)   Body mass index is 19.42 kg/m.     05/15/2022    2:41 PM 05/06/2021    1:53 PM 07/23/2011   11:49 AM  Advanced Directives  Does Patient Have a Medical Advance Directive? No No Patient would not like information;Patient does not have advance directive  Would patient like information on creating a medical advance directive? No - Patient declined No - Patient declined     Current Medications (verified) Outpatient Encounter Medications as of 05/15/2022  Medication Sig   alendronate (FOSAMAX) 70 MG tablet Take 1 tablet (70 mg total) by mouth every 7 (seven) days. Take with a full glass of water on an empty stomach.   Calcium Carbonate-Vitamin D (CALTRATE 600+D PO) Take 1 tablet by mouth.   diclofenac Sodium (VOLTAREN) 1 % GEL Apply 2 g topically 4 (four) times daily.   ibuprofen (ADVIL) 600 MG tablet Take 1 tablet (600 mg total) by mouth every 8 (eight) hours as needed.   ketorolac (ACULAR) 0.5 % ophthalmic solution SMARTSIG:In Eye(s)   ofloxacin (OCUFLOX) 0.3 % ophthalmic solution    pantoprazole (PROTONIX) 40 MG tablet TAKE 1 TABLET(40 MG) BY MOUTH DAILY   prednisoLONE acetate (PRED FORTE) 1 % ophthalmic suspension SMARTSIG:In Eye(s)   rOPINIRole (REQUIP) 1 MG tablet TAKE 1 TABLET(1 MG) BY MOUTH THREE TIMES DAILY  FOR LEG CRAMPS   rosuvastatin (CRESTOR) 10 MG tablet TAKE 1 TABLET(10 MG) BY MOUTH DAILY FOR CHOLESTEROL   No facility-administered encounter medications on file as of 05/15/2022.    Allergies (verified) Patient has no known allergies.   History: Past Medical History:  Diagnosis Date   Blood dyscrasia    "free bleeder "   Degeneration of lumbar or lumbosacral intervertebral disc 03/17/2016   GERD (gastroesophageal reflux disease)    Hyperlipidemia    Osteoporosis    Vitamin D deficiency    Past Surgical History:  Procedure Laterality Date   MULTIPLE EXTRACTIONS WITH ALVEOLOPLASTY  07/27/2011   Procedure: MULTIPLE EXTRACION WITH ALVEOLOPLASTY;  Surgeon: 09/26/2011, DDS;  Location: [facility] OR;  Service: Oral Surgery;  Laterality: Bilateral;  Insertion of denture.   Family History  Problem Relation Age of Onset   Heart disease Mother    Cancer Brother        lung   Social History   Socioeconomic History   Marital status: Single    Spouse name: Not on file   Number of children: 1   Years of education: 12   Highest education level: Not on file  Occupational History    Comment: disability  Tobacco Use   Smoking status: Never   Smokeless tobacco: Never  Vaping Use   Vaping Use: Never used  Substance and Sexual Activity   Alcohol use: No   Drug use: No   Sexual activity: Yes  Birth control/protection: Pill  Other Topics Concern   Not on file  Social History Narrative   Lives with daughter age 41, granddaughter   Disabled due to "slow learner"      Lives alone    Social Determinants of Health   Financial Resource Strain: Low Risk  (05/15/2022)   Overall Financial Resource Strain (CARDIA)    Difficulty of Paying Living Expenses: Not hard at all  Food Insecurity: No Food Insecurity (05/15/2022)   Hunger Vital Sign    Worried About Running Out of Food in the Last Year: Never true    Ran Out of Food in the Last Year: Never true  Transportation Needs: No  Transportation Needs (05/15/2022)   PRAPARE - Administrator, Civil Service (Medical): No    Lack of Transportation (Non-Medical): No  Physical Activity: Inactive (05/15/2022)   Exercise Vital Sign    Days of Exercise per Week: 0 days    Minutes of Exercise per Session: 0 min  Stress: No Stress Concern Present (05/15/2022)   [employer] of Occupational Health - Occupational Stress Questionnaire    Feeling of Stress : Not at all  Social Connections: Socially Isolated (05/15/2022)   Social Connection and Isolation Panel [NHANES]    Frequency of Communication with Friends and Family: More than three times a week    Frequency of Social Gatherings with Friends and Family: Three times a week    Attends Religious Services: Never    Active Member of Clubs or Organizations: No    Attends Engineer, structural: Never    Marital Status: Never married    Tobacco Counseling Counseling given: Not Answered   Clinical Intake:  Pre-visit preparation completed: Yes  Pain : No/denies pain  Diabetes: No  How often do you need to have someone help you when you read instructions, pamphlets, or other written materials from your doctor or pharmacy?: 1 - Never  Diabetic?No   Interpreter Needed?: No  Information entered by :: Kandis Fantasia LPN   Activities of Daily Living    05/15/2022    2:41 PM  In your present state of health, do you have any difficulty performing the following activities:  Hearing? 0  Vision? 0  Difficulty concentrating or making decisions? 0  Walking or climbing stairs? 0  Dressing or bathing? 0  Doing errands, shopping? 0  Preparing Food and eating ? N  Using the Toilet? N  In the past six months, have you accidently leaked urine? N  Do you have problems with loss of bowel control? N  Managing your Medications? N  Managing your Finances? N  Housekeeping or managing your Housekeeping? N    Patient Care Team: Tommie Sams, DO as PCP  - General (Family Medicine) Michaelle Copas, MD as Referring Physician (Optometry) Oliver Barre, MD as Consulting Physician (Orthopedic Surgery)  Indicate any recent Medical Services you may have received from other than [facility] providers in the past year (date may be approximate).     Assessment:   This is a routine wellness examination for Shannon Castaneda.  Hearing/Vision screen Hearing Screening - Comments:: Denies hearing difficulties   Vision Screening - Comments:: Up to date with routine eye exams with [facility]    Dietary issues and exercise activities discussed: Current Exercise Habits: The patient does not participate in regular exercise at present   Goals Addressed             This Visit's Progress  Prevent falls   On track     Depression Screen    05/15/2022    2:40 PM 03/25/2022   10:30 AM 01/01/2022    1:20 PM 12/15/2021   10:11 AM 07/22/2021    1:00 PM 05/06/2021    1:54 PM 03/20/2021   10:42 AM  PHQ 2/9 Scores  PHQ - 2 Score 0 0 0 0 0 0 0    Fall Risk    05/15/2022    2:37 PM 03/25/2022   10:30 AM 01/01/2022    1:20 PM 07/22/2021   12:59 PM 05/06/2021    1:54 PM  Fall Risk   Falls in the past year? 0 0 0 0 0  Number falls in past yr: 0  0    Injury with Fall? 0  0    Risk for fall due to : No Fall Risks  No Fall Risks    Follow up Falls evaluation completed;Education provided;Falls prevention discussed Falls evaluation completed Falls evaluation completed      FALL RISK PREVENTION PERTAINING TO THE HOME:  Any stairs in or around the home? No  If so, are there any without handrails? No  Home free of loose throw rugs in walkways, pet beds, electrical cords, etc? Yes  Adequate lighting in your home to reduce risk of falls? Yes   ASSISTIVE DEVICES UTILIZED TO PREVENT FALLS:  Life alert? No  Use of a cane, walker or w/c? No  Grab bars in the bathroom? Yes  Shower chair or bench in shower? No  Elevated toilet seat or a handicapped toilet? Yes    TIMED UP AND GO:  Was the test performed? No . Telephonic visit   Cognitive Function:        05/15/2022    2:41 PM 05/06/2021    1:55 PM  6CIT Screen  What Year? 0 points 0 points  What month? 0 points 0 points  What time? 0 points 0 points  Count back from 20 0 points 0 points  Months in reverse 0 points 2 points  Repeat phrase 2 points 2 points  Total Score 2 points 4 points    Immunizations Immunization History  Administered Date(s) Administered   Influenza Inj Mdck Quad Pf 02/27/2019   Influenza,inj,Quad PF,6+ Mos 01/30/2021   Influenza-Unspecified 01/16/2016   PFIZER(Purple Top)SARS-COV-2 Vaccination 01/04/2020, 01/25/2020   Zoster Recombinat (Shingrix) 02/27/2019, 01/30/2021    TDAP status: Due, Education has been provided regarding the importance of this vaccine. Advised may receive this vaccine at local pharmacy or Health Dept. Aware to provide a copy of the vaccination record if obtained from local pharmacy or Health Dept. Verbalized acceptance and understanding.  Flu Vaccine status: Declined, Education has been provided regarding the importance of this vaccine but patient still declined. Advised may receive this vaccine at local pharmacy or Health Dept. Aware to provide a copy of the vaccination record if obtained from local pharmacy or Health Dept. Verbalized acceptance and understanding.  Pneumococcal vaccine status: Up to date  Covid-19 vaccine status: Information provided on how to obtain vaccines.   Qualifies for Shingles Vaccine? Yes   Zostavax completed No   Shingrix Completed?: No.    Education has been provided regarding the importance of this vaccine. Patient has been advised to call insurance company to determine out of pocket expense if they have not yet received this vaccine. Advised may also receive vaccine at local pharmacy or Health Dept. Verbalized acceptance and understanding.  Screening Tests Health Maintenance  Topic Date Due   PAP  SMEAR-Modifier  05/15/2022 (Originally 02/03/1985)   COVID-19 Vaccine (3 - Pfizer risk series) 05/31/2022 (Originally 02/22/2020)   INFLUENZA VACCINE  08/16/2022 (Originally 12/16/2021)   MAMMOGRAM  04/03/2023 (Originally 06/01/2018)   COLONOSCOPY (Pts 45-65yrs Insurance coverage will need to be confirmed)  04/03/2023 (Originally 02/03/2009)   Medicare Annual Wellness (AWV)  05/16/2023   Hepatitis C Screening  Completed   HIV Screening  Completed   Zoster Vaccines- Shingrix  Completed   HPV VACCINES  Aged Out   DTaP/Tdap/Td  Discontinued    Health Maintenance  There are no preventive care reminders to display for this patient.   Colorectal cancer screening: No longer required. Patient declines at this time   Mammogram status: No longer required due to patient declining at this time .  Lung Cancer Screening: (Low Dose CT Chest recommended if Age 74-80 years, 30 pack-year currently smoking OR have quit w/in 15years.) does not qualify.   Lung Cancer Screening Referral: n/a  Additional Screening:  Hepatitis C Screening: does qualify; Completed 05/19/16  Vision Screening: Recommended annual ophthalmology exams for early detection of glaucoma and other disorders of the eye. Is the patient up to date with their annual eye exam?  Yes  Who is the provider or what is the name of the office in which the patient attends annual eye exams? [facility]  If pt is not established with a provider, would they like to be referred to a provider to establish care? No .   Dental Screening: Recommended annual dental exams for proper oral hygiene  Community Resource Referral / Chronic Care Management: CRR required this visit?  No   CCM required this visit?  No      Plan:     I have personally reviewed and noted the following in the patient's chart:   Medical and social history Use of alcohol, tobacco or illicit drugs  Current medications and supplements including opioid prescriptions. Patient  is not currently taking opioid prescriptions. Functional ability and status Nutritional status Physical activity Advanced directives List of other physicians Hospitalizations, surgeries, and ER visits in previous 12 months Vitals Screenings to include cognitive, depression, and falls Referrals and appointments  In addition, I have reviewed and discussed with patient certain preventive protocols, quality metrics, and best practice recommendations. A written personalized care plan for preventive services as well as general preventive health recommendations were provided to patient.     Durwin Nora, [place]   38/33/3832   Due to this being a virtual visit, the after visit summary with patients personalized plan was offered to patient via mail or my-chart. Patient would like to access on my-chart  Nurse Notes: No concerns

## 2022-07-16 ENCOUNTER — Encounter: Payer: Self-pay | Admitting: Radiology

## 2022-07-20 ENCOUNTER — Ambulatory Visit: Payer: 59 | Admitting: Family Medicine

## 2022-08-10 ENCOUNTER — Ambulatory Visit: Payer: 59 | Admitting: Family Medicine

## 2022-08-17 DIAGNOSIS — Z961 Presence of intraocular lens: Secondary | ICD-10-CM | POA: Diagnosis not present

## 2022-08-17 DIAGNOSIS — H04123 Dry eye syndrome of bilateral lacrimal glands: Secondary | ICD-10-CM | POA: Diagnosis not present

## 2022-08-17 DIAGNOSIS — H43813 Vitreous degeneration, bilateral: Secondary | ICD-10-CM | POA: Diagnosis not present

## 2022-08-31 ENCOUNTER — Ambulatory Visit: Payer: 59 | Admitting: Family Medicine

## 2022-08-31 ENCOUNTER — Telehealth: Payer: Self-pay

## 2022-08-31 NOTE — Telephone Encounter (Signed)
Pt is calling wants to see if there is something that she can take for energy any advice on what she can take?   [place] [phone]

## 2022-09-01 NOTE — Telephone Encounter (Signed)
Shannon Sams, DO     This needs an office visit. I have never seen this patient.

## 2022-09-01 NOTE — Telephone Encounter (Signed)
Patient has office visit scheduled 09/02/2022 at 3:30 PM with Dr Adriana Simas

## 2022-09-02 ENCOUNTER — Ambulatory Visit (INDEPENDENT_AMBULATORY_CARE_PROVIDER_SITE_OTHER): Payer: 59 | Admitting: Family Medicine

## 2022-09-02 VITALS — BP 104/60 | HR 72 | Temp 97.5°F | Ht 67.0 in | Wt 129.0 lb

## 2022-09-02 DIAGNOSIS — M25511 Pain in right shoulder: Secondary | ICD-10-CM | POA: Diagnosis not present

## 2022-09-02 DIAGNOSIS — M8580 Other specified disorders of bone density and structure, unspecified site: Secondary | ICD-10-CM

## 2022-09-02 DIAGNOSIS — R5382 Chronic fatigue, unspecified: Secondary | ICD-10-CM

## 2022-09-02 DIAGNOSIS — E559 Vitamin D deficiency, unspecified: Secondary | ICD-10-CM | POA: Diagnosis not present

## 2022-09-02 DIAGNOSIS — G8929 Other chronic pain: Secondary | ICD-10-CM

## 2022-09-02 MED ORDER — ALENDRONATE SODIUM 70 MG PO TABS: 70.0000 mg | ORAL_TABLET | ORAL | 4 refills | Status: DC

## 2022-09-02 MED ORDER — ROSUVASTATIN CALCIUM 10 MG PO TABS: ORAL_TABLET | ORAL | 3 refills | Status: DC

## 2022-09-02 MED ORDER — MELOXICAM 7.5 MG PO TABS: 7.5000 mg | ORAL_TABLET | Freq: Every day | ORAL | 0 refills | Status: DC | PRN

## 2022-09-02 NOTE — Patient Instructions (Signed)
Medication as prescribed. No ibuprofen or other NSAID's.  Labs today.  Take care  Dr. Adriana Simas

## 2022-09-03 ENCOUNTER — Telehealth: Payer: Self-pay

## 2022-09-03 DIAGNOSIS — M25511 Pain in right shoulder: Secondary | ICD-10-CM | POA: Insufficient documentation

## 2022-09-03 LAB — VITAMIN D 25 HYDROXY (VIT D DEFICIENCY, FRACTURES): Vit D, 25-Hydroxy: 35.6 ng/mL (ref 30.0–100.0)

## 2022-09-03 LAB — VITAMIN B12: Vitamin B-12: 780 pg/mL (ref 232–1245)

## 2022-09-03 LAB — TSH: TSH: 2.71 u[IU]/mL (ref 0.450–4.500)

## 2022-09-03 NOTE — Progress Notes (Signed)
Subjective:  Patient ID: Shannon Castaneda, female    DOB: Jun 27, 1965  Age: 57 y.o. MRN: [id number]  CC: Chief Complaint  Patient presents with   Fatigue   pain in right shoulder    Couple of months throbbing pain daily   left knee , leg hip pain    Chronic     HPI:  57 year old female presents for evaluation of fatigue as well as right shoulder pain.  Patient reports ongoing fatigue.  She states that this has been going on over the past 5 to 6 months.  She reports having little energy.  No reports of chest pain or shortness of breath.  No fever.  No weight loss.  Patient also reports right shoulder discomfort for the last 3 to 4 months.  She states that it is on the lateral aspect of her right upper arm/right shoulder.  No reports of decreased range of motion.  No fall, trauma, injury.  No reported weakness.  Patient Active Problem List   Diagnosis Date Noted   Right shoulder pain 09/03/2022   Restless legs 02/12/2021   Fatigue 06/19/2019   Gastroesophageal reflux disease 04/28/2019   Vitamin D deficiency 04/28/2019   Degeneration of lumbar or lumbosacral intervertebral disc 03/17/2016   Chronic back pain 03/17/2016   Mental impairment 03/17/2016   HLD (hyperlipidemia) 03/17/2016    Social Hx   Social History   Socioeconomic History   Marital status: Single    Spouse name: Not on file   Number of children: 1   Years of education: 12   Highest education level: Not on file  Occupational History    Comment: disability  Tobacco Use   Smoking status: Never   Smokeless tobacco: Never  Vaping Use   Vaping Use: Never used  Substance and Sexual Activity   Alcohol use: No   Drug use: No   Sexual activity: Yes    Birth control/protection: Pill  Other Topics Concern   Not on file  Social History Narrative   Lives with daughter age 52, granddaughter   Disabled due to "slow learner"      Lives alone    Social Determinants of Health   Financial Resource Strain: Low  Risk  (05/15/2022)   Overall Financial Resource Strain (CARDIA)    Difficulty of Paying Living Expenses: Not hard at all  Food Insecurity: No Food Insecurity (05/15/2022)   Hunger Vital Sign    Worried About Running Out of Food in the Last Year: Never true    Ran Out of Food in the Last Year: Never true  Transportation Needs: No Transportation Needs (05/15/2022)   PRAPARE - Administrator, Civil Service (Medical): No    Lack of Transportation (Non-Medical): No  Physical Activity: Inactive (05/15/2022)   Exercise Vital Sign    Days of Exercise per Week: 0 days    Minutes of Exercise per Session: 0 min  Stress: No Stress Concern Present (05/15/2022)   [employer] of Occupational Health - Occupational Stress Questionnaire    Feeling of Stress : Not at all  Social Connections: Socially Isolated (05/15/2022)   Social Connection and Isolation Panel [NHANES]    Frequency of Communication with Friends and Family: More than three times a week    Frequency of Social Gatherings with Friends and Family: Three times a week    Attends Religious Services: Never    Active Member of Clubs or Organizations: No    Attends Banker Meetings:  Never    Marital Status: Never married    Review of Systems Per HPI  Objective:  BP 104/60   Pulse 72   Temp (!) 97.5 F (36.4 C)   Ht  (1.702 m)   Wt 129 lb (58.5 kg)   LMP 10/27/2010   SpO2 99%   BMI 20.20 kg/m      09/02/2022    3:35 PM 05/15/2022    2:28 PM 03/25/2022   10:27 AM  BP/Weight  Systolic BP 104  045  Diastolic BP 60  68  Wt. (Lbs) 129 124 124.4  BMI 20.2 kg/m2 19.42 kg/m2 19.48 kg/m2    Physical Exam Vitals and nursing note reviewed.  Constitutional:      General: She is not in acute distress.    Appearance: Normal appearance.  HENT:     Head: Normocephalic and atraumatic.  Cardiovascular:     Rate and Rhythm: Normal rate and regular rhythm.  Pulmonary:     Effort: Pulmonary effort is  normal.     Breath sounds: Normal breath sounds. No wheezing, rhonchi or rales.  Musculoskeletal:     Comments: Shoulder: Right Inspection reveals no abnormalities, atrophy or asymmetry. Palpation is normal with no tenderness over AC joint or bicipital groove. ROM is full in all planes. Rotator cuff strength normal throughout. No signs of impingement with negative Hawkin's tests, empty can. No painful arc.    Neurological:     Mental Status: She is alert.  Psychiatric:        Mood and Affect: Mood normal.        Behavior: Behavior normal.     Lab Results  Component Value Date   WBC 6.1 03/25/2022   HGB 13.1 03/25/2022   HCT 39.8 03/25/2022   PLT 288 03/25/2022   GLUCOSE 94 03/25/2022   CHOL 250 (H) 03/25/2022   TRIG 119 03/25/2022   HDL 80 03/25/2022   LDLCALC 149 (H) 03/25/2022   ALT 40 (H) 03/25/2022   AST 32 03/25/2022   NA 138 03/25/2022   K 4.6 03/25/2022   CL 99 03/25/2022   CREATININE 0.83 03/25/2022   BUN 10 03/25/2022   CO2 23 03/25/2022   TSH 2.710 09/02/2022   INR 1.00 07/23/2011   HGBA1C 5.9 (H) 12/15/2021     Assessment & Plan:   Problem List Items Addressed This Visit       Other   Right shoulder pain    Exam unremarkable.  Meloxicam as directed.      Fatigue - Primary    Etiology unclear.  Labs obtained today and were unremarkable.  There does not appear to be any organic cause at this time.  No worrisome features.  Healthy diet, regular exercise.  Adequate sleep.  Supportive care.      Relevant Orders   TSH (Completed)   Vitamin B12 (Completed)   Vitamin D, 25-hydroxy (Completed)   Other Visit Diagnoses     Osteopenia, unspecified location       Relevant Medications   alendronate (FOSAMAX) 70 MG tablet       Meds ordered this encounter  Medications   alendronate (FOSAMAX) 70 MG tablet    Sig: Take 1 tablet (70 mg total) by mouth every 7 (seven) days. Take with a full glass of water on an empty stomach.    Dispense:  12  tablet    Refill:  4   rosuvastatin (CRESTOR) 10 MG tablet    Sig: TAKE 1 TABLET(10  MG) BY MOUTH DAILY FOR CHOLESTEROL    Dispense:  90 tablet    Refill:  3   meloxicam (MOBIC) 7.5 MG tablet    Sig: Take 1 tablet (7.5 mg total) by mouth daily as needed for pain.    Dispense:  30 tablet    Refill:  0    Follow-up:  Return if symptoms worsen or fail to improve.  Everlene Other DO [facility]

## 2022-09-03 NOTE — Assessment & Plan Note (Signed)
Exam unremarkable.  Meloxicam as directed.

## 2022-09-03 NOTE — Telephone Encounter (Signed)
Pt come to see Dr Adriana Simas yesterday and wanted to see if she can get something for heartburn. She took Protonix 40 mg and this was not strong enough.  [facility] on [address] [phone]

## 2022-09-03 NOTE — Assessment & Plan Note (Signed)
Etiology unclear.  Labs obtained today and were unremarkable.  There does not appear to be any organic cause at this time.  No worrisome features.  Healthy diet, regular exercise.  Adequate sleep.  Supportive care.

## 2022-09-04 ENCOUNTER — Other Ambulatory Visit: Payer: Self-pay | Admitting: Family Medicine

## 2022-09-04 ENCOUNTER — Telehealth: Payer: Self-pay | Admitting: Family Medicine

## 2022-09-04 ENCOUNTER — Other Ambulatory Visit: Payer: Self-pay

## 2022-09-04 DIAGNOSIS — K219 Gastro-esophageal reflux disease without esophagitis: Secondary | ICD-10-CM

## 2022-09-04 MED ORDER — PANTOPRAZOLE SODIUM 40 MG PO TBEC
40.0000 mg | DELAYED_RELEASE_TABLET | Freq: Two times a day (BID) | ORAL | 1 refills | Status: DC
Start: 2022-09-04 — End: 2022-09-04

## 2022-09-04 NOTE — Telephone Encounter (Signed)
Called and was not able to speak with patient at this time, left her a message to return the call.

## 2022-09-04 NOTE — Telephone Encounter (Signed)
Patient is requesting different prescription for heart burn she states Protonix 40 mg not helping, [facility] [address]

## 2022-09-04 NOTE — Telephone Encounter (Signed)
Patient returned call and informed per drs medication increase recommendations, refill sent to pharmacy per request.

## 2022-09-04 NOTE — Telephone Encounter (Signed)
Tommie Sams, DO     Advise patient she can take Protonix twice daily.

## 2022-10-02 ENCOUNTER — Other Ambulatory Visit: Payer: Self-pay | Admitting: Family Medicine

## 2022-10-08 ENCOUNTER — Ambulatory Visit (INDEPENDENT_AMBULATORY_CARE_PROVIDER_SITE_OTHER): Payer: 59 | Admitting: Nurse Practitioner

## 2022-10-08 ENCOUNTER — Ambulatory Visit: Payer: 59 | Admitting: Family Medicine

## 2022-10-08 VITALS — BP 127/75 | Ht 67.0 in | Wt 129.0 lb

## 2022-10-08 DIAGNOSIS — G8929 Other chronic pain: Secondary | ICD-10-CM | POA: Diagnosis not present

## 2022-10-08 DIAGNOSIS — S46911D Strain of unspecified muscle, fascia and tendon at shoulder and upper arm level, right arm, subsequent encounter: Secondary | ICD-10-CM | POA: Diagnosis not present

## 2022-10-08 DIAGNOSIS — M25511 Pain in right shoulder: Secondary | ICD-10-CM

## 2022-10-08 DIAGNOSIS — M62838 Other muscle spasm: Secondary | ICD-10-CM

## 2022-10-08 MED ORDER — TIZANIDINE HCL 4 MG PO TABS: ORAL_TABLET | ORAL | 0 refills | Status: DC

## 2022-10-08 NOTE — Progress Notes (Signed)
   Subjective:    Patient ID: Shannon Castaneda, female    DOB: 1965/09/06, 57 y.o.   MRN: [id number]  HPI Patient arrives with right arm pain for 5 months. Patient recalls no injury. Is able to perform her normal ADLs.  Points to the upper lateral arm as the area of her discomfort.  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  Right-handed. Was seen by Dr. Adriana Simas on 09/02/2022 for chronic right shoulder pain with a complete normal exam.       Objective:   Physical Exam NAD.  Alert, oriented.  Lungs clear.  Heart regular rate rhythm.  Distinct development of the biceps muscle on the right arm as compared to the left.  Minimal tenderness with palpation of the lateral upper right arm area.  Can perform full ROM of the shoulder without tenderness.  Slight sloping of the right shoulder as compared to left.  No impingement noted.  Tight tender muscles noted along the lateral neck into the trapezius and upper rhomboids. Today's Vitals   10/08/22 1603  BP: 127/75  Weight: 129 lb (58.5 kg)  Height: 5\' 7"  (1.702 m)   Body mass index is 20.2 kg/m.        Assessment & Plan:   Problem List Items Addressed This Visit       Other   Right shoulder pain   Other Visit Diagnoses     Muscle spasms of neck    -  Primary   Muscle strain of right shoulder, subsequent encounter          Right upper arm pain most likely related to overuse injury and muscle spasms. Continue meloxicam as directed. Meds ordered this encounter  Medications   tiZANidine (ZANAFLEX) 4 MG tablet    Sig: Take one tab po qhs prn muscle spasms    Dispense:  30 tablet    Refill:  0    Order Specific Question:   Supervising Provider    Answer:   Lilyan Punt A [[id number]]   Add tizanidine at bedtime as needed for muscle spasms. Recommend massage therapy. Stretching exercises. Ice/heat applications. TENS unit to the upper back and arm area. Avoid repetitive lifting of heavy objects. Call back if  persist, consider physical therapy at that time.

## 2022-10-09 ENCOUNTER — Encounter: Payer: Self-pay | Admitting: Nurse Practitioner

## 2022-10-27 ENCOUNTER — Telehealth: Payer: Self-pay | Admitting: Family Medicine

## 2022-10-27 NOTE — Telephone Encounter (Signed)
Patient was seen 5/23 and still having right arm pain and wanting to get x-ray of her arm. Please advise

## 2022-10-27 NOTE — Telephone Encounter (Signed)
Left a message for a return call for details of previous call messages.

## 2022-10-28 ENCOUNTER — Telehealth: Payer: Self-pay

## 2022-10-28 DIAGNOSIS — M25511 Pain in right shoulder: Secondary | ICD-10-CM

## 2022-10-28 NOTE — Telephone Encounter (Signed)
Patient called and would like X-ray of right shoulder. Patient has been experiencing right upper shoulder pain x 7 to 8 months. Throbbing pain in the muscle it hurt mostly with movement.

## 2022-10-30 ENCOUNTER — Other Ambulatory Visit: Payer: Self-pay | Admitting: Family Medicine

## 2022-11-03 ENCOUNTER — Other Ambulatory Visit: Payer: Self-pay | Admitting: Family Medicine

## 2022-11-04 ENCOUNTER — Other Ambulatory Visit: Payer: Self-pay | Admitting: Family Medicine

## 2022-11-04 ENCOUNTER — Telehealth: Payer: Self-pay | Admitting: Family Medicine

## 2022-11-04 DIAGNOSIS — K219 Gastro-esophageal reflux disease without esophagitis: Secondary | ICD-10-CM

## 2022-11-04 MED ORDER — TIZANIDINE HCL 4 MG PO TABS: ORAL_TABLET | ORAL | 3 refills | Status: AC

## 2022-11-04 NOTE — Telephone Encounter (Signed)
Refill on  tiZANidine (ZANAFLEX) 4 MG tablet  sent to [facility] [address]

## 2022-11-05 DIAGNOSIS — M25511 Pain in right shoulder: Secondary | ICD-10-CM | POA: Diagnosis not present

## 2022-11-05 DIAGNOSIS — Z6821 Body mass index (BMI) 21.0-21.9, adult: Secondary | ICD-10-CM | POA: Diagnosis not present

## 2022-11-10 ENCOUNTER — Telehealth: Payer: Self-pay

## 2022-11-10 NOTE — Telephone Encounter (Signed)
Called and spoke to patient per Eber Jones NP. Patient advised xray has been ordered for right shoulder pain. Patient verbalized understanding.

## 2022-11-10 NOTE — Telephone Encounter (Signed)
See telephone note.

## 2023-01-26 ENCOUNTER — Ambulatory Visit (HOSPITAL_COMMUNITY)
Admission: RE | Admit: 2023-01-26 | Discharge: 2023-01-26 | Disposition: A | Discharge status: Home / Self Care | Payer: 59 | Source: Ambulatory Visit | Attending: Family Medicine | Admitting: Family Medicine

## 2023-01-26 ENCOUNTER — Ambulatory Visit (INDEPENDENT_AMBULATORY_CARE_PROVIDER_SITE_OTHER): Payer: 59 | Admitting: Family Medicine

## 2023-01-26 ENCOUNTER — Encounter: Payer: Self-pay | Admitting: Family Medicine

## 2023-01-26 VITALS — BP 120/74 | HR 64 | Temp 97.6°F | Wt 126.4 lb

## 2023-01-26 DIAGNOSIS — K449 Diaphragmatic hernia without obstruction or gangrene: Secondary | ICD-10-CM | POA: Diagnosis not present

## 2023-01-26 DIAGNOSIS — R1032 Left lower quadrant pain: Secondary | ICD-10-CM | POA: Insufficient documentation

## 2023-01-26 DIAGNOSIS — K219 Gastro-esophageal reflux disease without esophagitis: Secondary | ICD-10-CM

## 2023-01-26 MED ORDER — PANTOPRAZOLE SODIUM 40 MG PO TBEC
40.0000 mg | DELAYED_RELEASE_TABLET | Freq: Two times a day (BID) | ORAL | 1 refills | Status: DC
Start: 2023-01-26 — End: 2023-08-17

## 2023-01-26 NOTE — Patient Instructions (Signed)
Protonix as prescribed.  We are going to get a CT to evaluate.  We will call with results.  Take care  Dr. Adriana Simas

## 2023-01-26 NOTE — Assessment & Plan Note (Signed)
New problem.  Etiology and prognosis unclear at this time.  CT scan for further evaluation.

## 2023-01-26 NOTE — Progress Notes (Signed)
Subjective:  Patient ID: Shannon Castaneda, female    DOB: [birth year]  Age: 57 y.o. MRN: [id number]  CC: Chief Complaint  Patient presents with   Abdominal Pain    Stomach ache when eating     HPI:  57 year old female presents for evaluation of the above.  Patient reports ongoing abdominal pain.  Patient states that she has had a 3 to 70-month history of intermittent left lower quadrant abdominal pain.  Has a bowel movement every 3 to 4 days.  Denies blood in the stool.  Patient also reports that she has issues with acid reflux.  She is on Protonix.  However, it appears that it has not been refilled recently.  Patient states that her abdominal pain occurs predominantly after eating.  No fever.  No reported weight loss.  No other complaints.  Patient Active Problem List   Diagnosis Date Noted   Left lower quadrant abdominal pain 01/26/2023   Right shoulder pain 09/03/2022   Restless legs 02/12/2021   Fatigue 06/19/2019   Gastroesophageal reflux disease 04/28/2019   Vitamin D deficiency 04/28/2019   Degeneration of lumbar or lumbosacral intervertebral disc 03/17/2016   Chronic back pain 03/17/2016   HLD (hyperlipidemia) 03/17/2016    Social Hx   Social History   Socioeconomic History   Marital status: Single    Spouse name: Not on file   Number of children: 1   Years of education: 12   Highest education level: Not on file  Occupational History    Comment: disability  Tobacco Use   Smoking status: Never   Smokeless tobacco: Never  Vaping Use   Vaping status: Never Used  Substance and Sexual Activity   Alcohol use: No   Drug use: No   Sexual activity: Yes    Birth control/protection: Pill  Other Topics Concern   Not on file  Social History Narrative   Lives with daughter age 31, granddaughter   Disabled due to "slow learner"      Lives alone    Social Determinants of Health   Financial Resource Strain: Low Risk  (05/15/2022)   Overall Financial Resource Strain  (CARDIA)    Difficulty of Paying Living Expenses: Not hard at all  Food Insecurity: No Food Insecurity (05/15/2022)   Hunger Vital Sign    Worried About Running Out of Food in the Last Year: Never true    Ran Out of Food in the Last Year: Never true  Transportation Needs: No Transportation Needs (05/15/2022)   PRAPARE - Administrator, Civil Service (Medical): No    Lack of Transportation (Non-Medical): No  Physical Activity: Inactive (05/15/2022)   Exercise Vital Sign    Days of Exercise per Week: 0 days    Minutes of Exercise per Session: 0 min  Stress: No Stress Concern Present (05/15/2022)   [employer] of Occupational Health - Occupational Stress Questionnaire    Feeling of Stress : Not at all  Social Connections: Socially Isolated (05/15/2022)   Social Connection and Isolation Panel [NHANES]    Frequency of Communication with Friends and Family: More than three times a week    Frequency of Social Gatherings with Friends and Family: Three times a week    Attends Religious Services: Never    Active Member of Clubs or Organizations: No    Attends Banker Meetings: Never    Marital Status: Never married    Review of Systems Per HPI  Objective:  BP 120/74  Pulse 64   Temp 97.6 F (36.4 C) (Oral)   Wt 126 lb 6.4 oz (57.3 kg)   LMP 10/27/2010   SpO2 99%   BMI 19.80 kg/m      01/26/2023   10:31 AM 10/08/2022    4:03 PM 09/02/2022    3:35 PM  BP/Weight  Systolic BP 120 127 104  Diastolic BP 74 75 60  Wt. (Lbs) 126.4 129 129  BMI 19.8 kg/m2 20.2 kg/m2 20.2 kg/m2    Physical Exam Constitutional:      General: She is not in acute distress. HENT:     Head: Normocephalic and atraumatic.  Cardiovascular:     Rate and Rhythm: Normal rate and regular rhythm.  Pulmonary:     Effort: Pulmonary effort is normal.     Breath sounds: Normal breath sounds. No wheezing, rhonchi or rales.  Abdominal:     Palpations: Abdomen is soft.      Comments: Nondistended.  Mild tenderness in the left lower quadrant.  No rebound or guarding.  Neurological:     Mental Status: She is alert.     Lab Results  Component Value Date   WBC 6.1 03/25/2022   HGB 13.1 03/25/2022   HCT 39.8 03/25/2022   PLT 288 03/25/2022   GLUCOSE 94 03/25/2022   CHOL 250 (H) 03/25/2022   TRIG 119 03/25/2022   HDL 80 03/25/2022   LDLCALC 149 (H) 03/25/2022   ALT 40 (H) 03/25/2022   AST 32 03/25/2022   NA 138 03/25/2022   K 4.6 03/25/2022   CL 99 03/25/2022   CREATININE 0.83 03/25/2022   BUN 10 03/25/2022   CO2 23 03/25/2022   TSH 2.710 09/02/2022   INR 1.00 07/23/2011   HGBA1C 5.9 (H) 12/15/2021     Assessment & Plan:   Problem List Items Addressed This Visit       Digestive   Gastroesophageal reflux disease    Advised to continue Protonix.  Refilled today.      Relevant Medications   pantoprazole (PROTONIX) 40 MG tablet     Other   Left lower quadrant abdominal pain - Primary    New problem.  Etiology and prognosis unclear at this time.  CT scan for further evaluation.      Relevant Orders   CT ABDOMEN PELVIS WO CONTRAST    Meds ordered this encounter  Medications   pantoprazole (PROTONIX) 40 MG tablet    Sig: Take 1 tablet (40 mg total) by mouth 2 (two) times daily before a meal.    Dispense:  180 tablet    Refill:  1    Follow-up:  Pending CT  Manjit Bufano DO [facility]

## 2023-01-26 NOTE — Assessment & Plan Note (Signed)
Advised to continue Protonix.  Refilled today.

## 2023-01-28 ENCOUNTER — Encounter (INDEPENDENT_AMBULATORY_CARE_PROVIDER_SITE_OTHER): Payer: Self-pay | Admitting: *Deleted

## 2023-01-28 ENCOUNTER — Other Ambulatory Visit: Payer: Self-pay

## 2023-01-28 DIAGNOSIS — K449 Diaphragmatic hernia without obstruction or gangrene: Secondary | ICD-10-CM

## 2023-02-02 ENCOUNTER — Encounter (INDEPENDENT_AMBULATORY_CARE_PROVIDER_SITE_OTHER): Payer: Self-pay | Admitting: Gastroenterology

## 2023-02-02 ENCOUNTER — Ambulatory Visit (INDEPENDENT_AMBULATORY_CARE_PROVIDER_SITE_OTHER): Payer: 59 | Admitting: Gastroenterology

## 2023-02-02 VITALS — BP 134/83 | HR 78 | Temp 98.1°F | Ht 67.0 in | Wt 125.6 lb

## 2023-02-02 DIAGNOSIS — R748 Abnormal levels of other serum enzymes: Secondary | ICD-10-CM

## 2023-02-02 DIAGNOSIS — R1319 Other dysphagia: Secondary | ICD-10-CM

## 2023-02-02 DIAGNOSIS — R1310 Dysphagia, unspecified: Secondary | ICD-10-CM | POA: Diagnosis not present

## 2023-02-02 DIAGNOSIS — K5900 Constipation, unspecified: Secondary | ICD-10-CM | POA: Diagnosis not present

## 2023-02-02 DIAGNOSIS — R1110 Vomiting, unspecified: Secondary | ICD-10-CM

## 2023-02-02 DIAGNOSIS — Z1211 Encounter for screening for malignant neoplasm of colon: Secondary | ICD-10-CM

## 2023-02-02 DIAGNOSIS — R1084 Generalized abdominal pain: Secondary | ICD-10-CM

## 2023-02-02 DIAGNOSIS — K5904 Chronic idiopathic constipation: Secondary | ICD-10-CM

## 2023-02-02 DIAGNOSIS — R109 Unspecified abdominal pain: Secondary | ICD-10-CM

## 2023-02-02 DIAGNOSIS — K219 Gastro-esophageal reflux disease without esophagitis: Secondary | ICD-10-CM | POA: Diagnosis not present

## 2023-02-02 DIAGNOSIS — K449 Diaphragmatic hernia without obstruction or gangrene: Secondary | ICD-10-CM | POA: Diagnosis not present

## 2023-02-02 HISTORY — DX: Encounter for screening for malignant neoplasm of colon: Z12.11

## 2023-02-02 MED ORDER — PEG 3350-KCL-NA BICARB-NACL 420 G PO SOLR: 4000.0000 mL | Freq: Once | ORAL | 0 refills | Status: AC

## 2023-02-02 MED ORDER — PSYLLIUM 58.6 % PO PACK
1.0000 | PACK | Freq: Two times a day (BID) | ORAL | 2 refills | Status: AC
Start: 2023-02-02 — End: 2023-05-03

## 2023-02-02 MED ORDER — POLYETHYLENE GLYCOL 3350 17 G PO PACK
17.0000 g | PACK | Freq: Two times a day (BID) | ORAL | 0 refills | Status: AC
Start: 2023-02-02 — End: 2023-05-03

## 2023-02-02 NOTE — Patient Instructions (Signed)
It was very nice to meet you today, as dicussed with will plan for the following :  1) upper endoscopy and colonoscopy  2) Ensure adequate fluid intake: Aim for 8 glasses of water daily. Follow a high fiber diet: Include foods such as dates, prunes, pears, and kiwi. Take Miralax twice a day for the first week, then reduce to once daily thereafter. Use Metamucil twice a day.

## 2023-02-02 NOTE — Progress Notes (Signed)
Vista Lawman , M.D. Gastroenterology & Hepatology [facility] Gastroenterology [address] [place], [place] [address] Primary Care Physician: Tommie Sams, DO [address] [place] [address]  Chief Complaint: Dysphagia, abdominal, constipation, GERD  History of Present Illness: Shannon Castaneda is a 57 y.o. female with HLD , osteoporosis who presents for evaluation of Dysphagia, abdominal, constipation, GERD with CT finding of large Hiatal hernia  Patient reports that occasionally she will have difficulty swallowing mostly solids and feels that getting stuck in middle of her chest.  Patient would also regurgitate food sometimes.  Has reflux and reports that PPI does not help much  Patient reports she would have a hard stool Bristol stool 2-3 every 3 to 4 days. The patient denies having any fever, chills, hematochezia, melena, hematemesis, abdominal distention, abdominal pain, diarrhea, jaundice, pruritus or weight loss.  Last MWU:XLKG Last Colonoscopy:none  FHx: neg for any gastrointestinal/liver disease, no malignancies Social: neg smoking, alcohol or illicit drug use Surgical: no abdominal surgeries  Past Medical History: Past Medical History:  Diagnosis Date   Blood dyscrasia    "free bleeder "   Degeneration of lumbar or lumbosacral intervertebral disc 03/17/2016   GERD (gastroesophageal reflux disease)    Hyperlipidemia    Osteoporosis    Vitamin D deficiency     Past Surgical History: Past Surgical History:  Procedure Laterality Date   MULTIPLE EXTRACTIONS WITH ALVEOLOPLASTY  07/27/2011   Procedure: MULTIPLE EXTRACION WITH ALVEOLOPLASTY;  Surgeon: Georgia Lopes, DDS;  Location: [facility] OR;  Service: Oral Surgery;  Laterality: Bilateral;  Insertion of denture.    Family History: Family History  Problem Relation Age of Onset   Heart disease Mother    Cancer Brother        lung    Social History: Social History    Tobacco Use  Smoking Status Never  Smokeless Tobacco Never   Social History   Substance and Sexual Activity  Alcohol Use No   Social History   Substance and Sexual Activity  Drug Use No    Allergies: No Known Allergies  Medications: Current Outpatient Medications  Medication Sig Dispense Refill   acetaminophen (TYLENOL) 325 MG tablet Take 650 mg by mouth every 6 (six) hours as needed.     alendronate (FOSAMAX) 70 MG tablet Take 1 tablet (70 mg total) by mouth every 7 (seven) days. Take with a full glass of water on an empty stomach. 12 tablet 4   Calcium Carbonate-Vitamin D (CALTRATE 600+D PO) Take 1 tablet by mouth.     diclofenac Sodium (VOLTAREN) 1 % GEL Apply 2 g topically 4 (four) times daily. 100 g 1   meloxicam (MOBIC) 7.5 MG tablet TAKE 1 TABLET(7.5 MG) BY MOUTH DAILY AS NEEDED FOR PAIN 30 tablet 0   pantoprazole (PROTONIX) 40 MG tablet Take 1 tablet (40 mg total) by mouth 2 (two) times daily before a meal. 180 tablet 1   polyethylene glycol (MIRALAX / GLYCOLAX) 17 g packet Take 17 g by mouth 2 (two) times daily. 180 packet 0   polyethylene glycol-electrolytes (NULYTELY) 420 g solution Take 4,000 mLs by mouth once for 1 dose. 4000 mL 0   psyllium (METAMUCIL) 58.6 % packet Take 1 packet by mouth 2 (two) times daily. 60 packet 2   rosuvastatin (CRESTOR) 10 MG tablet TAKE 1 TABLET(10 MG) BY MOUTH DAILY FOR CHOLESTEROL 90 tablet 3   tiZANidine (ZANAFLEX) 4 MG tablet Take one tab po qhs  prn muscle spasms 30 tablet 3   No current facility-administered medications for this visit.    Review of Systems: GENERAL: negative for malaise, night sweats HEENT: No changes in hearing or vision, no nose bleeds or other nasal problems. NECK: Negative for lumps, goiter, pain and significant neck swelling RESPIRATORY: Negative for cough, wheezing CARDIOVASCULAR: Negative for chest pain, leg swelling, palpitations, orthopnea GI: SEE HPI MUSCULOSKELETAL: Negative for joint pain or  swelling, back pain, and muscle pain. SKIN: Negative for lesions, rash HEMATOLOGY Negative for prolonged bleeding, bruising easily, and swollen nodes. ENDOCRINE: Negative for cold or heat intolerance, polyuria, polydipsia and goiter. NEURO: negative for tremor, gait imbalance, syncope and seizures. The remainder of the review of systems is noncontributory.   Physical Exam: BP 134/83 (BP Location: Left Arm, Patient Position: Sitting, Cuff Size: Normal)   Pulse 78   Temp 98.1 F (36.7 C) (Temporal)   Ht 5\' 7"  (1.702 m)   Wt 125 lb 9.6 oz (57 kg)   LMP 10/27/2010   BMI 19.67 kg/m  GENERAL: The patient is AO x3, in no acute distress. HEENT: Head is normocephalic and atraumatic. EOMI are intact. Mouth is well hydrated and without lesions. NECK: Supple. No masses LUNGS: Clear to auscultation. No presence of rhonchi/wheezing/rales. Adequate chest expansion HEART: RRR, normal s1 and s2. ABDOMEN: Soft, nontender, no guarding, no peritoneal signs, and nondistended. BS +. No masses. EXTREMITIES: Without any cyanosis, clubbing, rash, lesions or edema. NEUROLOGIC: AOx3, no focal motor deficit. SKIN: no jaundice, no rashes   Imaging/Labs: as above     Latest Ref Rng & Units 03/25/2022   11:14 AM 10/28/2021    3:31 PM 07/22/2021    1:46 PM  CBC  WBC 3.4 - 10.8 x10E3/uL 6.1  8.7  6.6   Hemoglobin 11.1 - 15.9 g/dL 13.2  44.0  10.2   Hematocrit 34.0 - 46.6 % 39.8  35.9  34.4   Platelets 150 - 450 x10E3/uL 288  251  251    Lab Results  Component Value Date   IRON 87 12/15/2021   TIBC 409 12/15/2021   FERRITIN 68 12/15/2021    I personally reviewed and interpreted the available labs, imaging and endoscopic files.  CT Abdomen and pelvis  1. No acute abdominopelvic abnormality. 2. Partially imaged large hiatal hernia containing the majority of the stomach.    Impression and Plan: Shannon Castaneda is a 57 y.o. female with HLD , osteoporosis who presents for evaluation of Dysphagia,  abdominal, constipation, GERD with CT finding of large Hiatal hernia  #Dysphagia #GERD/Regurgitation  Patient has intermittent solid food dysphagia regurgitation and GERD not responsive to PPI.  This is likely from large hiatal hernia seen on CT scan but other lesions such as stricture , webs, rings and malignancy needs to be ruled out  Will proceed with upper endoscopy with esophageal biopsies plus and minus dilation and assess the hiatal hernia  If symptoms are not controlled with lifestyle modification patient at that time can be referred for surgical hiatal hernia repair  1) Avoid coffee, tea, cola beverages, carbonated beverages, spicy foods, greasy foods, foods high in acid content (e.g. tomatoes and citrus fruits), chocolate, and peppermint 2) Avoid drinking alcoholic beverages 3) Avoid smoking 4) Eat small meals and keep weight within normal range 5) Avoid recumbent posture for 3 hours post-prandially 6) Elevate head of bed   #Constipation  Patient has a Bristol stool scale 2-3 bowel movement every 3 to 4 days This could also be  contributing to abdominal pain  Ensure adequate fluid intake: Aim for 8 glasses of water daily. Follow a high fiber diet: Include foods such as dates, prunes, pears, and kiwi. Take Miralax twice a day for the first week, then reduce to once daily thereafter. Use Metamucil twice a day.  #Elevated liver enzymes Likely from fatty liver disease ALT: 40   Will obtain baseline viral hepatitis profile autoimmune serology and right upper quadrant ultrasound  #CRC Screening Patient is average risk for colon cancer and never had any screening for proceed with colonoscopy as modality for colon cancer screening  All questions were answered.      Vista Lawman, MD Gastroenterology and Hepatology [facility] Gastroenterology   This chart has been completed using [facility] Dictation software, and while attempts have been made to  ensure accuracy , certain words and phrases may not be transcribed as intended

## 2023-02-02 NOTE — H&P (View-Only) (Signed)
Vista Lawman , M.D. Gastroenterology & Hepatology [facility] Gastroenterology [address] [place], [place] [address] Primary Care Physician: Tommie Sams, DO [address] [place] [address]  Chief Complaint: Dysphagia, abdominal, constipation, GERD  History of Present Illness: Shannon Castaneda is a 57 y.o. female with HLD , osteoporosis who presents for evaluation of Dysphagia, abdominal, constipation, GERD with CT finding of large Hiatal hernia  Patient reports that occasionally she will have difficulty swallowing mostly solids and feels that getting stuck in middle of her chest.  Patient would also regurgitate food sometimes.  Has reflux and reports that PPI does not help much  Patient reports she would have a hard stool Bristol stool 2-3 every 3 to 4 days. The patient denies having any fever, chills, hematochezia, melena, hematemesis, abdominal distention, abdominal pain, diarrhea, jaundice, pruritus or weight loss.  Last YQI:HKVQ Last Colonoscopy:none  FHx: neg for any gastrointestinal/liver disease, no malignancies Social: neg smoking, alcohol or illicit drug use Surgical: no abdominal surgeries  Past Medical History: Past Medical History:  Diagnosis Date   Blood dyscrasia    "free bleeder "   Degeneration of lumbar or lumbosacral intervertebral disc 03/17/2016   GERD (gastroesophageal reflux disease)    Hyperlipidemia    Osteoporosis    Vitamin D deficiency     Past Surgical History: Past Surgical History:  Procedure Laterality Date   MULTIPLE EXTRACTIONS WITH ALVEOLOPLASTY  07/27/2011   Procedure: MULTIPLE EXTRACION WITH ALVEOLOPLASTY;  Surgeon: Georgia Lopes, DDS;  Location: [facility] OR;  Service: Oral Surgery;  Laterality: Bilateral;  Insertion of denture.    Family History: Family History  Problem Relation Age of Onset   Heart disease Mother    Cancer Brother        lung    Social History: Social History    Tobacco Use  Smoking Status Never  Smokeless Tobacco Never   Social History   Substance and Sexual Activity  Alcohol Use No   Social History   Substance and Sexual Activity  Drug Use No    Allergies: No Known Allergies  Medications: Current Outpatient Medications  Medication Sig Dispense Refill   acetaminophen (TYLENOL) 325 MG tablet Take 650 mg by mouth every 6 (six) hours as needed.     alendronate (FOSAMAX) 70 MG tablet Take 1 tablet (70 mg total) by mouth every 7 (seven) days. Take with a full glass of water on an empty stomach. 12 tablet 4   Calcium Carbonate-Vitamin D (CALTRATE 600+D PO) Take 1 tablet by mouth.     diclofenac Sodium (VOLTAREN) 1 % GEL Apply 2 g topically 4 (four) times daily. 100 g 1   meloxicam (MOBIC) 7.5 MG tablet TAKE 1 TABLET(7.5 MG) BY MOUTH DAILY AS NEEDED FOR PAIN 30 tablet 0   pantoprazole (PROTONIX) 40 MG tablet Take 1 tablet (40 mg total) by mouth 2 (two) times daily before a meal. 180 tablet 1   polyethylene glycol (MIRALAX / GLYCOLAX) 17 g packet Take 17 g by mouth 2 (two) times daily. 180 packet 0   polyethylene glycol-electrolytes (NULYTELY) 420 g solution Take 4,000 mLs by mouth once for 1 dose. 4000 mL 0   psyllium (METAMUCIL) 58.6 % packet Take 1 packet by mouth 2 (two) times daily. 60 packet 2   rosuvastatin (CRESTOR) 10 MG tablet TAKE 1 TABLET(10 MG) BY MOUTH DAILY FOR CHOLESTEROL 90 tablet 3   tiZANidine (ZANAFLEX) 4 MG tablet Take one tab po qhs  prn muscle spasms 30 tablet 3   No current facility-administered medications for this visit.    Review of Systems: GENERAL: negative for malaise, night sweats HEENT: No changes in hearing or vision, no nose bleeds or other nasal problems. NECK: Negative for lumps, goiter, pain and significant neck swelling RESPIRATORY: Negative for cough, wheezing CARDIOVASCULAR: Negative for chest pain, leg swelling, palpitations, orthopnea GI: SEE HPI MUSCULOSKELETAL: Negative for joint pain or  swelling, back pain, and muscle pain. SKIN: Negative for lesions, rash HEMATOLOGY Negative for prolonged bleeding, bruising easily, and swollen nodes. ENDOCRINE: Negative for cold or heat intolerance, polyuria, polydipsia and goiter. NEURO: negative for tremor, gait imbalance, syncope and seizures. The remainder of the review of systems is noncontributory.   Physical Exam: BP 134/83 (BP Location: Left Arm, Patient Position: Sitting, Cuff Size: Normal)   Pulse 78   Temp 98.1 F (36.7 C) (Temporal)   Ht 5\' 7"  (1.702 m)   Wt 125 lb 9.6 oz (57 kg)   LMP 10/27/2010   BMI 19.67 kg/m  GENERAL: The patient is AO x3, in no acute distress. HEENT: Head is normocephalic and atraumatic. EOMI are intact. Mouth is well hydrated and without lesions. NECK: Supple. No masses LUNGS: Clear to auscultation. No presence of rhonchi/wheezing/rales. Adequate chest expansion HEART: RRR, normal s1 and s2. ABDOMEN: Soft, nontender, no guarding, no peritoneal signs, and nondistended. BS +. No masses. EXTREMITIES: Without any cyanosis, clubbing, rash, lesions or edema. NEUROLOGIC: AOx3, no focal motor deficit. SKIN: no jaundice, no rashes   Imaging/Labs: as above     Latest Ref Rng & Units 03/25/2022   11:14 AM 10/28/2021    3:31 PM 07/22/2021    1:46 PM  CBC  WBC 3.4 - 10.8 x10E3/uL 6.1  8.7  6.6   Hemoglobin 11.1 - 15.9 g/dL 60.4  54.0  98.1   Hematocrit 34.0 - 46.6 % 39.8  35.9  34.4   Platelets 150 - 450 x10E3/uL 288  251  251    Lab Results  Component Value Date   IRON 87 12/15/2021   TIBC 409 12/15/2021   FERRITIN 68 12/15/2021    I personally reviewed and interpreted the available labs, imaging and endoscopic files.  CT Abdomen and pelvis  1. No acute abdominopelvic abnormality. 2. Partially imaged large hiatal hernia containing the majority of the stomach.    Impression and Plan: [name] is a 57 y.o. female with HLD , osteoporosis who presents for evaluation of Dysphagia,  abdominal, constipation, GERD with CT finding of large Hiatal hernia  #Dysphagia #GERD/Regurgitation  Patient has intermittent solid food dysphagia regurgitation and GERD not responsive to PPI.  This is likely from large hiatal hernia seen on CT scan but other lesions such as stricture , webs, rings and malignancy needs to be ruled out  Will proceed with upper endoscopy with esophageal biopsies plus and minus dilation and assess the hiatal hernia  If symptoms are not controlled with lifestyle modification patient at that time can be referred for surgical hiatal hernia repair  1) Avoid coffee, tea, cola beverages, carbonated beverages, spicy foods, greasy foods, foods high in acid content (e.g. tomatoes and citrus fruits), chocolate, and peppermint 2) Avoid drinking alcoholic beverages 3) Avoid smoking 4) Eat small meals and keep weight within normal range 5) Avoid recumbent posture for 3 hours post-prandially 6) Elevate head of bed   #Constipation  Patient has a Bristol stool scale 2-3 bowel movement every 3 to 4 days This could also be  contributing to abdominal pain  Ensure adequate fluid intake: Aim for 8 glasses of water daily. Follow a high fiber diet: Include foods such as dates, prunes, pears, and kiwi. Take Miralax twice a day for the first week, then reduce to once daily thereafter. Use Metamucil twice a day.  #Elevated liver enzymes Likely from fatty liver disease ALT: 40   Will obtain baseline viral hepatitis profile autoimmune serology and right upper quadrant ultrasound  #CRC Screening Patient is average risk for colon cancer and never had any screening for proceed with colonoscopy as modality for colon cancer screening  All questions were answered.      Vista Lawman, MD Gastroenterology and Hepatology [facility]   This chart has been completed using [facility] Dictation software, and while attempts have been made to  ensure accuracy , certain words and phrases may not be transcribed as intended

## 2023-02-03 ENCOUNTER — Encounter (INDEPENDENT_AMBULATORY_CARE_PROVIDER_SITE_OTHER): Payer: Self-pay

## 2023-02-26 ENCOUNTER — Telehealth: Payer: Self-pay | Admitting: *Deleted

## 2023-02-26 ENCOUNTER — Encounter: Payer: Self-pay | Admitting: *Deleted

## 2023-02-26 NOTE — Telephone Encounter (Signed)
I saw the CT which shows large hiatal hernia. I will perform EGD to assess and examine the hernia. Large Hernia is usually  managed surgically by the surgeons , which I can refer/discuss with her after EGD if patient likes

## 2023-02-26 NOTE — Telephone Encounter (Signed)
Pt states she has an upcoming colonoscopy and EGD. She says she has a hernia and wants to know "how go get the hernia out"?. Please advise. Thank you

## 2023-02-26 NOTE — Telephone Encounter (Signed)
Pt informed of providers message. She would like to discuss getting the hernia taken care of after EGD.

## 2023-03-01 ENCOUNTER — Encounter: Payer: Self-pay | Admitting: Family Medicine

## 2023-03-02 ENCOUNTER — Other Ambulatory Visit: Payer: Self-pay

## 2023-03-02 ENCOUNTER — Ambulatory Visit (HOSPITAL_COMMUNITY): Payer: 59 | Admitting: Anesthesiology

## 2023-03-02 ENCOUNTER — Encounter (HOSPITAL_COMMUNITY): Payer: Self-pay

## 2023-03-02 ENCOUNTER — Encounter (HOSPITAL_COMMUNITY)
Admission: RE | Disposition: A | Discharge status: Home / Self Care | Payer: Self-pay | Source: Ambulatory Visit | Attending: Gastroenterology

## 2023-03-02 ENCOUNTER — Ambulatory Visit (HOSPITAL_COMMUNITY)
Admission: RE | Admit: 2023-03-02 | Discharge: 2023-03-02 | Disposition: A | Discharge status: Home / Self Care | Payer: 59 | Source: Ambulatory Visit | Attending: Gastroenterology | Admitting: Gastroenterology

## 2023-03-02 DIAGNOSIS — E785 Hyperlipidemia, unspecified: Secondary | ICD-10-CM | POA: Insufficient documentation

## 2023-03-02 DIAGNOSIS — D124 Benign neoplasm of descending colon: Secondary | ICD-10-CM | POA: Insufficient documentation

## 2023-03-02 DIAGNOSIS — D126 Benign neoplasm of colon, unspecified: Secondary | ICD-10-CM | POA: Diagnosis not present

## 2023-03-02 DIAGNOSIS — K2950 Unspecified chronic gastritis without bleeding: Secondary | ICD-10-CM | POA: Diagnosis not present

## 2023-03-02 DIAGNOSIS — D121 Benign neoplasm of appendix: Secondary | ICD-10-CM | POA: Insufficient documentation

## 2023-03-02 DIAGNOSIS — Z1211 Encounter for screening for malignant neoplasm of colon: Secondary | ICD-10-CM | POA: Insufficient documentation

## 2023-03-02 DIAGNOSIS — K5900 Constipation, unspecified: Secondary | ICD-10-CM | POA: Insufficient documentation

## 2023-03-02 DIAGNOSIS — D759 Disease of blood and blood-forming organs, unspecified: Secondary | ICD-10-CM | POA: Diagnosis not present

## 2023-03-02 DIAGNOSIS — R1310 Dysphagia, unspecified: Secondary | ICD-10-CM | POA: Insufficient documentation

## 2023-03-02 DIAGNOSIS — K449 Diaphragmatic hernia without obstruction or gangrene: Secondary | ICD-10-CM

## 2023-03-02 DIAGNOSIS — D120 Benign neoplasm of cecum: Secondary | ICD-10-CM | POA: Diagnosis not present

## 2023-03-02 DIAGNOSIS — K3189 Other diseases of stomach and duodenum: Secondary | ICD-10-CM

## 2023-03-02 DIAGNOSIS — K440 Diaphragmatic hernia with obstruction, without gangrene: Secondary | ICD-10-CM

## 2023-03-02 DIAGNOSIS — D125 Benign neoplasm of sigmoid colon: Secondary | ICD-10-CM | POA: Diagnosis not present

## 2023-03-02 DIAGNOSIS — K219 Gastro-esophageal reflux disease without esophagitis: Secondary | ICD-10-CM

## 2023-03-02 DIAGNOSIS — K648 Other hemorrhoids: Secondary | ICD-10-CM | POA: Insufficient documentation

## 2023-03-02 DIAGNOSIS — K644 Residual hemorrhoidal skin tags: Secondary | ICD-10-CM | POA: Insufficient documentation

## 2023-03-02 DIAGNOSIS — K2100 Gastro-esophageal reflux disease with esophagitis, without bleeding: Secondary | ICD-10-CM | POA: Diagnosis not present

## 2023-03-02 DIAGNOSIS — K209 Esophagitis, unspecified without bleeding: Secondary | ICD-10-CM

## 2023-03-02 HISTORY — PX: COLONOSCOPY WITH PROPOFOL: SHX5780

## 2023-03-02 HISTORY — PX: BIOPSY: SHX5522

## 2023-03-02 HISTORY — PX: SUBMUCOSAL LIFTING INJECTION: SHX6855

## 2023-03-02 HISTORY — PX: POLYPECTOMY: SHX5525

## 2023-03-02 HISTORY — PX: ESOPHAGOGASTRODUODENOSCOPY (EGD) WITH PROPOFOL: SHX5813

## 2023-03-02 LAB — HM COLONOSCOPY

## 2023-03-02 SURGERY — COLONOSCOPY WITH PROPOFOL
Anesthesia: General

## 2023-03-02 MED ORDER — LACTATED RINGERS IV SOLN
INTRAVENOUS | Status: DC | PRN
Start: 2023-03-02 — End: 2023-03-02

## 2023-03-02 MED ORDER — PROPOFOL 10 MG/ML IV BOLUS
INTRAVENOUS | Status: DC | PRN
  Administered 2023-03-02: 75 mg via INTRAVENOUS
  Administered 2023-03-02: 20 mg via INTRAVENOUS
  Administered 2023-03-02: 10 mg via INTRAVENOUS
  Administered 2023-03-02: 2 mg via INTRAVENOUS
  Administered 2023-03-02: 20 mg via INTRAVENOUS
  Administered 2023-03-02: 25 mg via INTRAVENOUS
  Administered 2023-03-02: 20 mg via INTRAVENOUS

## 2023-03-02 MED ORDER — PROPOFOL 500 MG/50ML IV EMUL
INTRAVENOUS | Status: DC | PRN
  Administered 2023-03-02: 150 ug/kg/min via INTRAVENOUS

## 2023-03-02 MED ORDER — EPHEDRINE SULFATE (PRESSORS) 50 MG/ML IJ SOLN
INTRAMUSCULAR | Status: DC | PRN
  Administered 2023-03-02: 10 mg via INTRAVENOUS

## 2023-03-02 MED ORDER — LIDOCAINE HCL 1 % IJ SOLN
INTRAMUSCULAR | Status: DC | PRN
  Administered 2023-03-02: 50 mg via INTRADERMAL

## 2023-03-02 NOTE — Interval H&P Note (Signed)
History and Physical Interval Note:  03/02/2023 7:45 AM  Shannon Castaneda  has presented today for surgery, with the diagnosis of gerd,dysphagia, screening.  The various methods of treatment have been discussed with the patient and family. After consideration of risks, benefits and other options for treatment, the patient has consented to  Procedure(s) with comments: COLONOSCOPY WITH PROPOFOL (N/A) - 7:30am;asa 2 ESOPHAGOGASTRODUODENOSCOPY (EGD) WITH PROPOFOL (N/A) - 7:30am;asa 2 as a surgical intervention.  The patient's history has been reviewed, patient examined, no change in status, stable for surgery.  I have reviewed the patient's chart and labs.  Questions were answered to the patient's satisfaction.     Juanetta Beets Masey Scheiber

## 2023-03-02 NOTE — Anesthesia Postprocedure Evaluation (Signed)
Anesthesia Post Note  Patient: Shannon Castaneda  Procedure(s) Performed: COLONOSCOPY WITH PROPOFOL ESOPHAGOGASTRODUODENOSCOPY (EGD) WITH PROPOFOL BIOPSY POLYPECTOMY SUBMUCOSAL LIFTING INJECTION  Patient location during evaluation: Endoscopy Anesthesia Type: General Level of consciousness: awake and alert Pain management: pain level controlled Vital Signs Assessment: post-procedure vital signs reviewed and stable Respiratory status: spontaneous breathing Cardiovascular status: blood pressure returned to baseline and stable Postop Assessment: no apparent nausea or vomiting Anesthetic complications: no   No notable events documented.   Last Vitals:  Vitals:   03/02/23 0805  BP: 136/74  Pulse: 91  Resp: 15  Temp: 36.5 C  SpO2: 100%    Last Pain:  Vitals:   03/02/23 0837  TempSrc:   PainSc: 0-No pain                 Celene Pippins

## 2023-03-02 NOTE — Op Note (Signed)
[facility] Patient Name: Shannon Castaneda Procedure Date: 03/02/2023 8:27 AM MRN: [id number] Date of Birth: 1965/12/09 Attending MD: Sanjuan Dame , MD, [id number] CSN: [id number] Age: 57 Admit Type: Outpatient Procedure:                Upper GI endoscopy Indications:              Dysphagia, Esophageal reflux symptoms that persist                            despite appropriate therapy Providers:                Sanjuan Dame, MD, Buel Ream. Thomasena Edis RN, RN,                            Francoise Ceo RN, RN, Elinor Parkinson Referring MD:              Medicines:                Monitored Anesthesia Care Complications:            No immediate complications. Estimated Blood Loss:     Estimated blood loss: none. Procedure:                Pre-Anesthesia Assessment:                           - Prior to the procedure, a History and Physical                            was performed, and patient medications and                            allergies were reviewed. The patient's tolerance of                            previous anesthesia was also reviewed. The risks                            and benefits of the procedure and the sedation                            options and risks were discussed with the patient.                            All questions were answered, and informed consent                            was obtained. Prior Anticoagulants: The patient has                            taken no anticoagulant or antiplatelet agents. ASA                            Grade Assessment: II - A patient with mild systemic  disease. After reviewing the risks and benefits,                            the patient was deemed in satisfactory condition to                            undergo the procedure.                           After obtaining informed consent, the endoscope was                            passed under direct vision. Throughout the                             procedure, the patient's blood pressure, pulse, and                            oxygen saturations were monitored continuously. The                            [id number]-H190 ([id number]) scope was introduced through the                            mouth, and advanced to the second part of duodenum.                            The upper GI endoscopy was accomplished without                            difficulty. The patient tolerated the procedure                            well. Scope In: 8:42:05 AM Scope Out: 8:51:50 AM Total Procedure Duration: 0 hours 9 minutes 45 seconds  Findings:      Esophagogastric landmarks were identified: the Z-line was found at 35 cm       and the site of the diaphragmatic hiatus was found at 46 cm from the       incisors.      LA Grade A (one or more mucosal breaks less than 5 mm, not extending       between tops of 2 mucosal folds) esophagitis with no bleeding was found       in the lower third of the esophagus.      A 9 cm hiatal hernia was present.      Mildly erythematous mucosa with stigmata of recent bleeding was found in       the gastric antrum. Biopsies were taken with a cold forceps for       histology.      The duodenal bulb and second portion of the duodenum were normal. Impression:               - Esophagogastric landmarks identified.                           - LA Grade A  reflux esophagitis with no bleeding.                           - 9 cm hiatal hernia.                           - Erythematous mucosa in the antrum. Biopsied.                           - Normal duodenal bulb and second portion of the                            duodenum. Moderate Sedation:      Per Anesthesia Care Recommendation:           - Patient has a contact number available for                            emergencies. The signs and symptoms of potential                            delayed complications were discussed with the                            patient. Return to normal  activities tomorrow.                            Written discharge instructions were provided to the                            patient.                           - Resume previous diet.                           - Continue present medications.                           - Await pathology results.                           Referred to surgery for symptomatic large hiatal                            hernia Procedure Code(s):        --- Professional ---                           [phone], Esophagogastroduodenoscopy, flexible,                            transoral; with biopsy, single or multiple Diagnosis Code(s):        --- Professional ---                           K21.00, Gastro-esophageal reflux disease with  esophagitis, without bleeding                           K44.9, Diaphragmatic hernia without obstruction or                            gangrene                           K31.89, Other diseases of stomach and duodenum                           R13.10, Dysphagia, unspecified CPT copyright 2022 American Medical Association. All rights reserved. The codes documented in this report are preliminary and upon coder review may  be revised to meet current compliance requirements. Sanjuan Dame, MD Sanjuan Dame, MD 03/02/2023 9:29:50 AM This report has been signed electronically. Number of Addenda: 0

## 2023-03-02 NOTE — Discharge Instructions (Signed)

## 2023-03-02 NOTE — Anesthesia Preprocedure Evaluation (Signed)
Anesthesia Evaluation  Patient identified by MRN, date of birth, ID band Patient awake    Reviewed: Allergy & Precautions, H&P , NPO status , Patient's Chart, lab work & pertinent test results, reviewed documented beta blocker date and time   Airway Mallampati: II  TM Distance: >3 FB Neck ROM: full    Dental no notable dental hx.    Pulmonary neg pulmonary ROS   Pulmonary exam normal breath sounds clear to auscultation       Cardiovascular Exercise Tolerance: Good negative cardio ROS  Rhythm:regular Rate:Normal     Neuro/Psych negative neurological ROS  negative psych ROS   GI/Hepatic negative GI ROS, Neg liver ROS,GERD  ,,  Endo/Other  negative endocrine ROS    Renal/GU negative Renal ROS  negative genitourinary   Musculoskeletal   Abdominal   Peds  Hematology negative hematology ROS (+) Blood dyscrasia   Anesthesia Other Findings   Reproductive/Obstetrics negative OB ROS                             Anesthesia Physical Anesthesia Plan  ASA: 2  Anesthesia Plan: General   Post-op Pain Management:    Induction:   PONV Risk Score and Plan: Propofol infusion  Airway Management Planned:   Additional Equipment:   Intra-op Plan:   Post-operative Plan:   Informed Consent: I have reviewed the patients History and Physical, chart, labs and discussed the procedure including the risks, benefits and alternatives for the proposed anesthesia with the patient or authorized representative who has indicated his/her understanding and acceptance.     Dental Advisory Given  Plan Discussed with: CRNA  Anesthesia Plan Comments:        Anesthesia Quick Evaluation

## 2023-03-02 NOTE — Transfer of Care (Signed)
Immediate Anesthesia Transfer of Care Note  Patient: Shannon Castaneda  Procedure(s) Performed: COLONOSCOPY WITH PROPOFOL ESOPHAGOGASTRODUODENOSCOPY (EGD) WITH PROPOFOL BIOPSY POLYPECTOMY SUBMUCOSAL LIFTING INJECTION  Patient Location: Endoscopy Unit  Anesthesia Type:General  Level of Consciousness: awake  Airway & Oxygen Therapy: Patient Spontanous Breathing  Post-op Assessment: Report given to RN  Post vital signs: Reviewed and stable  Last Vitals:  Vitals Value Taken Time  BP    Temp    Pulse    Resp    SpO2      Last Pain:  Vitals:   03/02/23 0837  TempSrc:   PainSc: 0-No pain      Patients Stated Pain Goal: 9 (03/02/23 0805)  Complications: No notable events documented.

## 2023-03-02 NOTE — Op Note (Signed)
[facility] Patient Name: Shannon Castaneda Procedure Date: 03/02/2023 8:31 AM MRN: [id number] Date of Birth: [birth year] Attending MD: Sanjuan Dame , MD, [id number] CSN: [id number] Age: 57 Admit Type: Outpatient Procedure:                Colonoscopy Indications:              Screening for colorectal malignant neoplasm Providers:                Sanjuan Dame, MD, Buel Ream. Thomasena Edis RN, RN,                            Francoise Ceo RN, RN, Elinor Parkinson Referring MD:              Medicines:                Monitored Anesthesia Care Complications:            No immediate complications. Estimated Blood Loss:     Estimated blood loss was minimal. Procedure:                Pre-Anesthesia Assessment:                           - Prior to the procedure, a History and Physical                            was performed, and patient medications and                            allergies were reviewed. The patient's tolerance of                            previous anesthesia was also reviewed. The risks                            and benefits of the procedure and the sedation                            options and risks were discussed with the patient.                            All questions were answered, and informed consent                            was obtained. Prior Anticoagulants: The patient has                            taken no anticoagulant or antiplatelet agents. ASA                            Grade Assessment: II - A patient with mild systemic                            disease. After reviewing the risks and benefits,  the patient was deemed in satisfactory condition to                            undergo the procedure.                           After obtaining informed consent, the colonoscope                            was passed under direct vision. Throughout the                            procedure, the patient's blood pressure, pulse, and                             oxygen saturations were monitored continuously. The                            [id number]) scope was introduced through the                            anus and advanced to the the cecum, identified by                            appendiceal orifice and ileocecal valve. The                            colonoscopy was performed without difficulty. The                            patient tolerated the procedure well. The quality                            of the bowel preparation was evaluated using the                            BBPS [facility] Bowel Preparation Scale) with scores                            of: Right Colon = 3, Transverse Colon = 3 and Left                            Colon = 3 (entire mucosa seen well with no residual                            staining, small fragments of stool or opaque                            liquid). The total BBPS score equals 9. The                            ileocecal valve, appendiceal orifice, and rectum  were photographed. Scope In: 8:57:56 AM Scope Out: 9:26:52 AM Scope Withdrawal Time: 0 hours 26 minutes 29 seconds  Total Procedure Duration: 0 hours 28 minutes 56 seconds  Findings:      A 10 mm polyp was found in the appendiceal orifice. The polyp was       sessile. The polyp was removed with a cold snare. Resection and       retrieval were complete.      A 4 mm polyp was found in the cecum. The polyp was sessile. The polyp       was removed with a cold snare. Resection and retrieval were complete.      A 3 mm polyp was found in the descending colon. The polyp was sessile.       The polyp was removed with a cold snare. Resection and retrieval were       complete.      A 15 mm polyp was found in the sigmoid colon. The polyp was sessile.       Preparations were made for mucosal resection. Demarcation of the lesion       was performed with high-definition white light and narrow band imaging       to  clearly identify the boundaries of the lesion. Eleview was injected       to raise the lesion. Snare mucosal resection was performed. Resection       and retrieval were complete. Resected tissue margins were examined and       clear of polyp tissue.      External and internal hemorrhoids were found during retroflexion. The       hemorrhoids were small. Impression:               - One 10 mm polyp at the appendiceal orifice,                            removed with a cold snare. Resected and retrieved.                           - One 4 mm polyp in the cecum, removed with a cold                            snare. Resected and retrieved.                           - One 3 mm polyp in the descending colon, removed                            with a cold snare. Resected and retrieved.                           - One 15 mm polyp in the sigmoid colon, removed                            with mucosal resection. Resected and retrieved.                           - External and internal hemorrhoids.                           -  Mucosal resection was performed. Resection and                            retrieval were complete. Moderate Sedation:      Per Anesthesia Care Recommendation:           - Patient has a contact number available for                            emergencies. The signs and symptoms of potential                            delayed complications were discussed with the                            patient. Return to normal activities tomorrow.                            Written discharge instructions were provided to the                            patient.                           - Resume previous diet.                           - Continue present medications.                           - Await pathology results.                           - Repeat colonoscopy in 3 years for surveillance.                           - Return to GI clinic. Procedure Code(s):        --- Professional ---                            [phone], Colonoscopy, flexible; with endoscopic                            mucosal resection                           45385, 59, Colonoscopy, flexible; with removal of                            tumor(s), polyp(s), or other lesion(s) by snare                            technique Diagnosis Code(s):        --- Professional ---                           Z12.11, Encounter for screening for malignant  neoplasm of colon                           D12.1, Benign neoplasm of appendix                           D12.0, Benign neoplasm of cecum                           D12.4, Benign neoplasm of descending colon                           D12.5, Benign neoplasm of sigmoid colon                           K64.8, Other hemorrhoids CPT copyright 2022 American Medical Association. All rights reserved. The codes documented in this report are preliminary and upon coder review may  be revised to meet current compliance requirements. Sanjuan Dame, MD Sanjuan Dame, MD 03/02/2023 9:33:54 AM This report has been signed electronically. Number of Addenda: 0

## 2023-03-04 ENCOUNTER — Encounter (INDEPENDENT_AMBULATORY_CARE_PROVIDER_SITE_OTHER): Payer: Self-pay | Admitting: *Deleted

## 2023-03-04 LAB — SURGICAL PATHOLOGY

## 2023-03-08 NOTE — Progress Notes (Signed)
I reviewed the pathology results. Ann, can you send her a letter with the findings as described below please? Repeat colonoscopy in 3 years  Thanks,  Shannon Lawman, MD Gastroenterology and Hepatology [facility]  ---------------------------------------------------------------------------------------------  [facility] [address], [place], [place] [address] Phone:  [phone]   03/08/23 [place], [place]   Dear Shannon Castaneda,  I am writing to inform you that the biopsies taken during your recent endoscopic examination showed:  No H. Pylori bacteria in stomach , or any early cancer changes to the stomach mucosa ( Intestinal metaplasia)  Normal biopsies of the food-pipe ( no eosinophilic esophagitis )   I am writing to let you know the results of your recent colonoscopy.  You had a total of 4 polyps removed. 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  Given these findings, it is recommended that your next colonoscopy be performed in 3 years because large polyps were removed   Please call us at [phone] if you have persistent problems or have questions about your condition that have not been fully answered at this time.  Sincerely,  Shannon Lawman, MD Gastroenterology and Hepatology

## 2023-03-09 ENCOUNTER — Encounter (INDEPENDENT_AMBULATORY_CARE_PROVIDER_SITE_OTHER): Payer: Self-pay | Admitting: *Deleted

## 2023-03-11 ENCOUNTER — Encounter (HOSPITAL_COMMUNITY): Payer: Self-pay | Admitting: Gastroenterology

## 2023-04-06 ENCOUNTER — Ambulatory Visit: Payer: Self-pay | Admitting: Surgery

## 2023-04-06 DIAGNOSIS — K5909 Other constipation: Secondary | ICD-10-CM | POA: Diagnosis not present

## 2023-04-06 DIAGNOSIS — M419 Scoliosis, unspecified: Secondary | ICD-10-CM | POA: Diagnosis present

## 2023-04-06 DIAGNOSIS — K2100 Gastro-esophageal reflux disease with esophagitis, without bleeding: Secondary | ICD-10-CM | POA: Diagnosis not present

## 2023-04-06 DIAGNOSIS — K2930 Chronic superficial gastritis without bleeding: Secondary | ICD-10-CM | POA: Diagnosis not present

## 2023-04-06 DIAGNOSIS — K440 Diaphragmatic hernia with obstruction, without gangrene: Secondary | ICD-10-CM | POA: Diagnosis not present

## 2023-04-06 DIAGNOSIS — R112 Nausea with vomiting, unspecified: Secondary | ICD-10-CM

## 2023-04-06 DIAGNOSIS — Z860101 Personal history of adenomatous and serrated colon polyps: Secondary | ICD-10-CM | POA: Insufficient documentation

## 2023-04-06 HISTORY — DX: Nausea with vomiting, unspecified: R11.2

## 2023-04-12 ENCOUNTER — Telehealth (INDEPENDENT_AMBULATORY_CARE_PROVIDER_SITE_OTHER): Payer: Self-pay | Admitting: Gastroenterology

## 2023-04-12 NOTE — Telephone Encounter (Signed)
Received records from the most recent visit to [facility] with Dr. Jerene Bears for large hiatal hernia evaluation.  Did not receive the assessment page hence not sure what is surgery plan for hiatal hernia repair at this time.

## 2023-04-19 ENCOUNTER — Telehealth: Payer: Self-pay

## 2023-04-19 NOTE — Patient Outreach (Signed)
Successful call to patient on today regarding preventative mammogram screening. Patient declined at this time and will follow up with PCP at later date.  Baruch Gouty [place]/VBCI  [facility] Assistant-Population Health [phone]

## 2023-04-27 DIAGNOSIS — K440 Diaphragmatic hernia with obstruction, without gangrene: Secondary | ICD-10-CM | POA: Diagnosis not present

## 2023-06-21 NOTE — Patient Instructions (Signed)
DUE TO COVID-19 ONLY TWO VISITORS  (aged 60 and older)  ARE ALLOWED TO COME WITH YOU AND STAY IN THE WAITING ROOM ONLY DURING PRE OP AND PROCEDURE.   **NO VISITORS ARE ALLOWED IN THE SHORT STAY AREA OR RECOVERY ROOM!!**  IF YOU WILL BE ADMITTED INTO THE HOSPITAL YOU ARE ALLOWED ONLY FOUR SUPPORT PEOPLE DURING VISITATION HOURS ONLY (7 AM -8PM)   The support person(s) must pass our screening, gel in and out, and wear a mask at all times, including in the patient's room. Patients must also wear a mask when staff or their support person are in the room. Visitors GUEST BADGE MUST BE WORN VISIBLY  One adult visitor may remain with you overnight and MUST be in the room by 8 P.M.     Your procedure is scheduled on: 07/02/23   Report to [facility] Main Entrance    Report to admitting at : 5:15 AM   Call this number if you have problems the morning of surgery [phone]   Clear liquids starting the day before surgery until : 4:30 AM DAY OF SURGERY. Drink plenty clear liquids the day before surgery.  Water Black Coffee (sugar ok, NO MILK/CREAM OR CREAMERS)  Tea (sugar ok, NO MILK/CREAM OR CREAMERS) regular and decaf                             Plain Jell-O (NO RED)                                           Fruit ices (not with fruit pulp, NO RED)                                     Popsicles (NO RED)                                                                  Juice: apple, WHITE grape, WHITE cranberry Sports drinks like Gatorade (NO RED)              Drink 2 Ensure/G2 drinks AT 10:00 PM the night before surgery.    The day of surgery:  Drink ONE (1) Pre-Surgery Clear Ensure at : 4:30 AM the morning of surgery. Drink in one sitting. Do not sip.  This drink was given to you during your hospital  pre-op appointment visit. Nothing else to drink after completing the  Pre-Surgery Clear Ensure or G2.          If you have questions, please contact your surgeon's office.  FOLLOW  BOWEL PREP AND ANY ADDITIONAL PRE OP INSTRUCTIONS YOU RECEIVED FROM YOUR SURGEON'S OFFICE!!!   Oral Hygiene is also important to reduce your risk of infection.                                    Remember - BRUSH YOUR TEETH THE MORNING OF SURGERY WITH YOUR REGULAR TOOTHPASTE  DENTURES WILL BE REMOVED  PRIOR TO SURGERY PLEASE DO NOT APPLY "Poly grip" OR ADHESIVES!!!   Do NOT smoke after Midnight   Take these medicines the morning of surgery with A SIP OF WATER: NONE.Tylenol as needed.                   You may not have any metal on your body including hair pins, jewelry, and body piercing             Do not wear make-up, lotions, powders, perfumes/cologne, or deodorant  Do not wear nail polish including gel and S&S, artificial/acrylic nails, or any other type of covering on natural nails including finger and toenails. If you have artificial nails, gel coating, etc. that needs to be removed by a nail salon please have this removed prior to surgery or surgery may need to be canceled/ delayed if the surgeon/ anesthesia feels like they are unable to be safely monitored.   Do not shave  48 hours prior to surgery.    Do not bring valuables to the hospital. [facility] IS NOT             RESPONSIBLE   FOR VALUABLES.   Contacts, glasses, or bridgework may not be worn into surgery.   Bring small overnight bag day of surgery.   DO NOT BRING YOUR HOME MEDICATIONS TO THE HOSPITAL. PHARMACY WILL DISPENSE MEDICATIONS LISTED ON YOUR MEDICATION LIST TO YOU DURING YOUR ADMISSION IN THE HOSPITAL!    Patients discharged on the day of surgery will not be allowed to drive home.  Someone NEEDS to stay with you for the first 24 hours after anesthesia.   Special Instructions: Bring a copy of your healthcare power of attorney and living will documents         the day of surgery if you haven't scanned them before.              Please read over the following fact sheets you were given: IF YOU HAVE QUESTIONS ABOUT  YOUR PRE-OP INSTRUCTIONS PLEASE CALL [phone]    [facility] - Preparing for Surgery Before surgery, you can play an important role.  Because skin is not sterile, your skin needs to be as free of germs as possible.  You can reduce the number of germs on your skin by washing with CHG (chlorahexidine gluconate) soap before surgery.  CHG is an antiseptic cleaner which kills germs and bonds with the skin to continue killing germs even after washing. Please DO NOT use if you have an allergy to CHG or antibacterial soaps.  If your skin becomes reddened/irritated stop using the CHG and inform your nurse when you arrive at Short Stay. Do not shave (including legs and underarms) for at least 48 hours prior to the first CHG shower.  You may shave your face/neck. Please follow these instructions carefully:  1.  Shower with CHG Soap the night before surgery and the  morning of Surgery.  2.  If you choose to wash your hair, wash your hair first as usual with your  normal  shampoo.  3.  After you shampoo, rinse your hair and body thoroughly to remove the  shampoo.                           4.  Use CHG as you would any other liquid soap.  You can apply chg directly  to the skin and wash  Gently with a scrungie or clean washcloth.  5.  Apply the CHG Soap to your body ONLY FROM THE NECK DOWN.   Do not use on face/ open                           Wound or open sores. Avoid contact with eyes, ears mouth and genitals (private parts).                       Wash face,  Genitals (private parts) with your normal soap.             6.  Wash thoroughly, paying special attention to the area where your surgery  will be performed.  7.  Thoroughly rinse your body with warm water from the neck down.  8.  DO NOT shower/wash with your normal soap after using and rinsing off  the CHG Soap.                9.  Pat yourself dry with a clean towel.            10.  Wear clean pajamas.            11.  Place clean  sheets on your bed the night of your first shower and do not  sleep with pets. Day of Surgery : Do not apply any lotions/deodorants the morning of surgery.  Please wear clean clothes to the hospital/surgery center.  FAILURE TO FOLLOW THESE INSTRUCTIONS MAY RESULT IN THE CANCELLATION OF YOUR SURGERY PATIENT SIGNATURE_________________________________  NURSE SIGNATURE__________________________________  ________________________________________________________________________

## 2023-06-23 ENCOUNTER — Encounter (HOSPITAL_COMMUNITY)
Admission: RE | Admit: 2023-06-23 | Discharge: 2023-06-23 | Disposition: A | Discharge status: Home / Self Care | Payer: 59 | Source: Ambulatory Visit | Attending: Anesthesiology | Admitting: Anesthesiology

## 2023-06-23 DIAGNOSIS — Z01818 Encounter for other preprocedural examination: Secondary | ICD-10-CM

## 2023-06-24 NOTE — Patient Instructions (Addendum)
SURGICAL WAITING ROOM VISITATION  Patients having surgery or a procedure may have no more than 2 support people in the waiting area - these visitors may rotate.    Children under the age of 66 must have an adult with them who is not the patient.  Due to an increase in RSV and influenza rates and associated hospitalizations, children ages 28 and under may not visit patients in [facility] hospitals.  Visitors with respiratory illnesses are discouraged from visiting and should remain at home.  If the patient needs to stay at the hospital during part of their recovery, the visitor guidelines for inpatient rooms apply. Pre-op nurse will coordinate an appropriate time for 1 support person to accompany patient in pre-op.  This support person may not rotate.    Please refer to the [facility] website for the visitor guidelines for Inpatients (after your surgery is over and you are in a regular room).       Your procedure is scheduled on: 07-02-23   Report to [facility] Main Entrance    Report to admitting at     0515 AM   Call this number if you have problems the morning of surgery [phone]   Do not eat food :After Midnight.   After Midnight you may have the following liquids until _0430_____ AM/ PM DAY OF SURGERY  Water Non-Citrus Juices (without pulp, NO RED-Apple, White grape, White cranberry) Black Coffee (NO MILK/CREAM OR CREAMERS, sugar ok)  Clear Tea (NO MILK/CREAM OR CREAMERS, sugar ok) regular and decaf                             Plain Jell-O (NO RED)                                           Fruit ices (not with fruit pulp, NO RED)                                     Popsicles (NO RED)                                                               Sports drinks like Gatorade (NO RED)              Drink 2 Ensure/G2 drinks AT 10:00 PM the night before surgery.        The day of surgery:  Drink ONE (1) Pre-Surgery Clear Ensure BY 0430  AM the morning of surgery.  Drink in one sitting. Do not sip.  This drink was given to you during your hospital  pre-op appointment visit. Nothing else to drink after completing the  Pre-Surgery Clear Ensure .          If you have questions, please contact your surgeon's office.   FOLLOW ANY ADDITIONAL PRE OP INSTRUCTIONS YOU RECEIVED FROM YOUR SURGEON'S OFFICE!!!     Oral Hygiene is also important to reduce your risk of infection.  Remember - BRUSH YOUR TEETH THE MORNING OF SURGERY WITH YOUR REGULAR TOOTHPASTE  DENTURES WILL BE REMOVED PRIOR TO SURGERY PLEASE DO NOT APPLY "Poly grip" OR ADHESIVES!!!   Do NOT smoke after Midnight   Stop all vitamins and herbal supplements 7 days before surgery.   Take these medicines the morning of surgery with A SIP OF WATER: rosuvastatin  DO NOT TAKE ANY ORAL DIABETIC MEDICATIONS DAY OF YOUR SURGERY  Bring CPAP mask and tubing day of surgery.                              You may not have any metal on your body including hair pins, jewelry, and body piercing             Do not wear make-up, lotions, powders, perfumes/cologne, or deodorant  Do not wear nail polish including gel and S&S, artificial/acrylic nails, or any other type of covering on natural nails including finger and toenails. If you have artificial nails, gel coating, etc. that needs to be removed by a nail salon please have this removed prior to surgery or surgery may need to be canceled/ delayed if the surgeon/ anesthesia feels like they are unable to be safely monitored.   Do not shave  48 hours prior to surgery.               Men may shave face and neck.   Do not bring valuables to the hospital. [facility] IS NOT             RESPONSIBLE   FOR VALUABLES.   Contacts, glasses, dentures or bridgework may not be worn into surgery.   Bring small overnight bag day of surgery.   DO NOT BRING YOUR HOME MEDICATIONS TO THE HOSPITAL. PHARMACY WILL DISPENSE MEDICATIONS LISTED  ON YOUR MEDICATION LIST TO YOU DURING YOUR ADMISSION IN THE HOSPITAL!    Patients discharged on the day of surgery will not be allowed to drive home.  Someone NEEDS to stay with you for the first 24 hours after anesthesia.   Special Instructions: Bring a copy of your healthcare power of attorney and living will documents the day of surgery if you haven't scanned them before.              Please read over the following fact sheets you were given: IF YOU HAVE QUESTIONS ABOUT YOUR PRE-OP INSTRUCTIONS PLEASE CALL [phone]    If you test positive for Covid or have been in contact with anyone that has tested positive in the last 10 days please notify you surgeon.    [facility] - Preparing for Surgery Before surgery, you can play an important role.  Because skin is not sterile, your skin needs to be as free of germs as possible.  You can reduce the number of germs on your skin by washing with CHG (chlorahexidine gluconate) soap before surgery.  CHG is an antiseptic cleaner which kills germs and bonds with the skin to continue killing germs even after washing. Please DO NOT use if you have an allergy to CHG or antibacterial soaps.  If your skin becomes reddened/irritated stop using the CHG and inform your nurse when you arrive at Short Stay. Do not shave (including legs and underarms) for at least 48 hours prior to the first CHG shower.  You may shave your face/neck. Please follow these instructions carefully:  1.  Shower with CHG Soap the night  before surgery and the  morning of Surgery.  2.  If you choose to wash your hair, wash your hair first as usual with your  normal  shampoo.  3.  After you shampoo, rinse your hair and body thoroughly to remove the  shampoo.                           4.  Use CHG as you would any other liquid soap.  You can apply chg directly  to the skin and wash                       Gently with a scrungie or clean washcloth.  5.  Apply the CHG Soap to your body ONLY FROM  THE NECK DOWN.   Do not use on face/ open                           Wound or open sores. Avoid contact with eyes, ears mouth and genitals (private parts).                       Wash face,  Genitals (private parts) with your normal soap.             6.  Wash thoroughly, paying special attention to the area where your surgery  will be performed.  7.  Thoroughly rinse your body with warm water from the neck down.  8.  DO NOT shower/wash with your normal soap after using and rinsing off  the CHG Soap.                9.  Pat yourself dry with a clean towel.            10.  Wear clean pajamas.            11.  Place clean sheets on your bed the night of your first shower and do not  sleep with pets. Day of Surgery : Do not apply any lotions/deodorants the morning of surgery.  Please wear clean clothes to the hospital/surgery center.  FAILURE TO FOLLOW THESE INSTRUCTIONS MAY RESULT IN THE CANCELLATION OF YOUR SURGERY PATIENT SIGNATURE_________________________________  NURSE SIGNATURE__________________________________  ________________________________________________________________________

## 2023-06-28 ENCOUNTER — Encounter (HOSPITAL_COMMUNITY): Payer: 59

## 2023-06-29 ENCOUNTER — Other Ambulatory Visit: Payer: Self-pay

## 2023-06-29 ENCOUNTER — Encounter (HOSPITAL_COMMUNITY): Payer: Self-pay

## 2023-06-29 ENCOUNTER — Other Ambulatory Visit (HOSPITAL_COMMUNITY): Payer: 59

## 2023-06-29 ENCOUNTER — Encounter (HOSPITAL_COMMUNITY)
Admission: RE | Admit: 2023-06-29 | Discharge: 2023-06-29 | Disposition: A | Discharge status: Home / Self Care | Payer: 59 | Source: Ambulatory Visit | Attending: Surgery | Admitting: Surgery

## 2023-06-29 DIAGNOSIS — Z01818 Encounter for other preprocedural examination: Secondary | ICD-10-CM

## 2023-06-29 DIAGNOSIS — Z01812 Encounter for preprocedural laboratory examination: Secondary | ICD-10-CM | POA: Diagnosis not present

## 2023-06-29 HISTORY — DX: Personal history of other diseases of the digestive system: Z87.19

## 2023-06-29 LAB — CBC
HCT: 37.9 % (ref 36.0–46.0)
Hemoglobin: 12.1 g/dL (ref 12.0–15.0)
MCH: 29.7 pg (ref 26.0–34.0)
MCHC: 31.9 g/dL (ref 30.0–36.0)
MCV: 92.9 fL (ref 80.0–100.0)
Platelets: 261 10*3/uL (ref 150–400)
RBC: 4.08 MIL/uL (ref 3.87–5.11)
RDW: 13.9 % (ref 11.5–15.5)
WBC: 5.6 10*3/uL (ref 4.0–10.5)
nRBC: 0 % (ref 0.0–0.2)

## 2023-06-29 NOTE — Progress Notes (Signed)
Anesthesia Review:  PCP: Everlene Other , DO Cardiologist :no Chest x-ray : EKG : Echo : 2021  Stress test: Cardiac Cath :  Activity level: Able to climb a flight of stairs without CP or SOB Sleep Study/ CPAP : Fasting Blood Sugar :      / Checks Blood Sugar -- times a day:   Blood Thinner/ Instructions /Last Dose:N/A ASA / Instructions/ Last Dose :

## 2023-07-02 ENCOUNTER — Ambulatory Visit (HOSPITAL_COMMUNITY): Payer: 59 | Admitting: Anesthesiology

## 2023-07-02 ENCOUNTER — Encounter (HOSPITAL_COMMUNITY)
Admission: RE | Disposition: A | Discharge status: Home / Self Care | Payer: Self-pay | Source: Ambulatory Visit | Attending: Surgery

## 2023-07-02 ENCOUNTER — Encounter (HOSPITAL_COMMUNITY): Payer: Self-pay | Admitting: Surgery

## 2023-07-02 ENCOUNTER — Other Ambulatory Visit: Payer: Self-pay

## 2023-07-02 ENCOUNTER — Observation Stay (HOSPITAL_COMMUNITY)
Admission: RE | Admit: 2023-07-02 | Discharge: 2023-07-05 | Disposition: A | Discharge status: Home / Self Care | Payer: 59 | Source: Ambulatory Visit | Attending: Surgery | Admitting: Surgery

## 2023-07-02 DIAGNOSIS — K449 Diaphragmatic hernia without obstruction or gangrene: Secondary | ICD-10-CM | POA: Diagnosis not present

## 2023-07-02 DIAGNOSIS — R5383 Other fatigue: Secondary | ICD-10-CM | POA: Diagnosis present

## 2023-07-02 DIAGNOSIS — K440 Diaphragmatic hernia with obstruction, without gangrene: Principal | ICD-10-CM | POA: Insufficient documentation

## 2023-07-02 DIAGNOSIS — G8929 Other chronic pain: Secondary | ICD-10-CM | POA: Diagnosis present

## 2023-07-02 DIAGNOSIS — A1801 Tuberculosis of spine: Secondary | ICD-10-CM | POA: Insufficient documentation

## 2023-07-02 DIAGNOSIS — K219 Gastro-esophageal reflux disease without esophagitis: Secondary | ICD-10-CM | POA: Diagnosis not present

## 2023-07-02 DIAGNOSIS — K429 Umbilical hernia without obstruction or gangrene: Secondary | ICD-10-CM | POA: Diagnosis not present

## 2023-07-02 DIAGNOSIS — K5904 Chronic idiopathic constipation: Secondary | ICD-10-CM | POA: Insufficient documentation

## 2023-07-02 DIAGNOSIS — E785 Hyperlipidemia, unspecified: Secondary | ICD-10-CM | POA: Insufficient documentation

## 2023-07-02 DIAGNOSIS — M419 Scoliosis, unspecified: Secondary | ICD-10-CM | POA: Diagnosis present

## 2023-07-02 DIAGNOSIS — K2930 Chronic superficial gastritis without bleeding: Secondary | ICD-10-CM | POA: Diagnosis not present

## 2023-07-02 HISTORY — PX: XI ROBOTIC ASSISTED PARAESOPHAGEAL HERNIA REPAIR: SHX6871

## 2023-07-02 SURGERY — REPAIR, HERNIA, PARAESOPHAGEAL, ROBOT-ASSISTED
Anesthesia: General

## 2023-07-02 MED ORDER — HYDROMORPHONE HCL 1 MG/ML IJ SOLN: 0.5000 mg | INTRAMUSCULAR | Status: DC | PRN

## 2023-07-02 MED ORDER — BUPIVACAINE LIPOSOME 1.3 % IJ SUSP
INTRAMUSCULAR | Status: DC | PRN
  Administered 2023-07-02: 20 mL

## 2023-07-02 MED ORDER — SALINE SPRAY 0.65 % NA SOLN
1.0000 | Freq: Four times a day (QID) | NASAL | Status: DC | PRN
Start: 2023-07-02 — End: 2023-07-05

## 2023-07-02 MED ORDER — PROCHLORPERAZINE EDISYLATE 10 MG/2ML IJ SOLN: 5.0000 mg | Freq: Four times a day (QID) | INTRAMUSCULAR | Status: DC | PRN

## 2023-07-02 MED ORDER — SIMETHICONE 80 MG PO CHEW
80.0000 mg | CHEWABLE_TABLET | Freq: Four times a day (QID) | ORAL | Status: DC
  Administered 2023-07-02 – 2023-07-05 (×11): 80 mg via ORAL
  Filled 2023-07-02 (×11): qty 1

## 2023-07-02 MED ORDER — ONDANSETRON HCL 4 MG/2ML IJ SOLN
INTRAMUSCULAR | Status: DC | PRN
  Administered 2023-07-02: 4 mg via INTRAVENOUS

## 2023-07-02 MED ORDER — SODIUM CHLORIDE 0.9 % IV SOLN: INTRAVENOUS | Status: DC | PRN

## 2023-07-02 MED ORDER — ENSURE PRE-SURGERY PO LIQD: 296.0000 mL | Freq: Once | ORAL | Status: DC

## 2023-07-02 MED ORDER — PHENYLEPHRINE HCL (PRESSORS) 10 MG/ML IV SOLN
INTRAVENOUS | Status: DC | PRN
  Administered 2023-07-02: 80 ug via INTRAVENOUS

## 2023-07-02 MED ORDER — BISACODYL 10 MG RE SUPP: 10.0000 mg | Freq: Every day | RECTAL | Status: DC | PRN

## 2023-07-02 MED ORDER — DEXAMETHASONE SODIUM PHOSPHATE 10 MG/ML IJ SOLN
8.0000 mg | Freq: Two times a day (BID) | INTRAMUSCULAR | Status: DC
  Administered 2023-07-02 – 2023-07-04 (×5): 8 mg via INTRAVENOUS
  Filled 2023-07-02 (×5): qty 1

## 2023-07-02 MED ORDER — SODIUM CHLORIDE 0.9 % IV SOLN
2.0000 g | INTRAVENOUS | Status: AC
  Administered 2023-07-02: 2 g via INTRAVENOUS
  Filled 2023-07-02: qty 20

## 2023-07-02 MED ORDER — SODIUM CHLORIDE 0.9% FLUSH: 3.0000 mL | INTRAVENOUS | Status: DC | PRN

## 2023-07-02 MED ORDER — FENTANYL CITRATE (PF) 100 MCG/2ML IJ SOLN
INTRAMUSCULAR | Status: DC | PRN
Start: 2023-07-02 — End: 2023-07-02
  Administered 2023-07-02 (×2): 50 ug via INTRAVENOUS

## 2023-07-02 MED ORDER — ONDANSETRON 4 MG PO TBDP: 4.0000 mg | ORAL_TABLET | Freq: Four times a day (QID) | ORAL | Status: DC | PRN

## 2023-07-02 MED ORDER — ROCURONIUM BROMIDE 100 MG/10ML IV SOLN
INTRAVENOUS | Status: DC | PRN
  Administered 2023-07-02: 60 mg via INTRAVENOUS

## 2023-07-02 MED ORDER — PROPOFOL 500 MG/50ML IV EMUL
INTRAVENOUS | Status: AC
  Filled 2023-07-02: qty 50

## 2023-07-02 MED ORDER — TRAMADOL HCL 50 MG PO TABS
50.0000 mg | ORAL_TABLET | Freq: Four times a day (QID) | ORAL | Status: DC | PRN
  Administered 2023-07-02 – 2023-07-04 (×2): 100 mg via ORAL
  Filled 2023-07-02 (×2): qty 2

## 2023-07-02 MED ORDER — ACETAMINOPHEN 500 MG PO TABS
1000.0000 mg | ORAL_TABLET | Freq: Four times a day (QID) | ORAL | Status: DC
  Administered 2023-07-02 – 2023-07-05 (×10): 1000 mg via ORAL
  Filled 2023-07-02 (×10): qty 2

## 2023-07-02 MED ORDER — KETAMINE HCL 10 MG/ML IJ SOLN
INTRAMUSCULAR | Status: DC | PRN
  Administered 2023-07-02: 20 mg via INTRAVENOUS
  Administered 2023-07-02: 10 mg via INTRAVENOUS

## 2023-07-02 MED ORDER — MIDAZOLAM HCL 2 MG/2ML IJ SOLN
INTRAMUSCULAR | Status: DC | PRN
  Administered 2023-07-02 (×2): 1 mg via INTRAVENOUS

## 2023-07-02 MED ORDER — FENTANYL CITRATE PF 50 MCG/ML IJ SOSY
PREFILLED_SYRINGE | INTRAMUSCULAR | Status: AC
  Administered 2023-07-02: 25 ug via INTRAVENOUS
  Filled 2023-07-02: qty 1

## 2023-07-02 MED ORDER — LACTATED RINGERS IV SOLN: INTRAVENOUS | Status: DC | PRN

## 2023-07-02 MED ORDER — LACTATED RINGERS IV SOLN: 1000.0000 mL | Freq: Three times a day (TID) | INTRAVENOUS | Status: AC | PRN

## 2023-07-02 MED ORDER — KCL IN DEXTROSE-NACL 20-5-0.45 MEQ/L-%-% IV SOLN
INTRAVENOUS | Status: AC
  Filled 2023-07-02: qty 1000

## 2023-07-02 MED ORDER — SODIUM CHLORIDE 0.9 % IV SOLN: 250.0000 mL | INTRAVENOUS | Status: DC | PRN

## 2023-07-02 MED ORDER — DIPHENHYDRAMINE HCL 50 MG/ML IJ SOLN: 12.5000 mg | Freq: Four times a day (QID) | INTRAMUSCULAR | Status: DC | PRN

## 2023-07-02 MED ORDER — SODIUM CHLORIDE 0.9% FLUSH
3.0000 mL | Freq: Two times a day (BID) | INTRAVENOUS | Status: DC
  Administered 2023-07-02 – 2023-07-04 (×6): 3 mL via INTRAVENOUS

## 2023-07-02 MED ORDER — LIDOCAINE HCL (CARDIAC) PF 100 MG/5ML IV SOSY
PREFILLED_SYRINGE | INTRAVENOUS | Status: DC | PRN
  Administered 2023-07-02: 40 mg via INTRAVENOUS

## 2023-07-02 MED ORDER — OXYCODONE HCL 5 MG PO TABS: 5.0000 mg | ORAL_TABLET | Freq: Four times a day (QID) | ORAL | 0 refills | Status: DC | PRN

## 2023-07-02 MED ORDER — ACETAMINOPHEN 500 MG PO TABS
1000.0000 mg | ORAL_TABLET | ORAL | Status: AC
Start: 2023-07-02 — End: 2023-07-02
  Administered 2023-07-02: 1000 mg via ORAL
  Filled 2023-07-02: qty 2

## 2023-07-02 MED ORDER — CHLORHEXIDINE GLUCONATE CLOTH 2 % EX PADS: 6.0000 | MEDICATED_PAD | Freq: Once | CUTANEOUS | Status: DC

## 2023-07-02 MED ORDER — FENTANYL CITRATE (PF) 100 MCG/2ML IJ SOLN
INTRAMUSCULAR | Status: AC
  Filled 2023-07-02: qty 2

## 2023-07-02 MED ORDER — ENOXAPARIN SODIUM 40 MG/0.4ML IJ SOSY
40.0000 mg | PREFILLED_SYRINGE | INTRAMUSCULAR | Status: DC
  Administered 2023-07-03 – 2023-07-05 (×3): 40 mg via SUBCUTANEOUS
  Filled 2023-07-02 (×3): qty 0.4

## 2023-07-02 MED ORDER — SUGAMMADEX SODIUM 200 MG/2ML IV SOLN
INTRAVENOUS | Status: DC | PRN
  Administered 2023-07-02: 200 mg via INTRAVENOUS

## 2023-07-02 MED ORDER — MAGNESIUM HYDROXIDE 400 MG/5ML PO SUSP: 30.0000 mL | Freq: Every day | ORAL | Status: DC | PRN

## 2023-07-02 MED ORDER — DEXAMETHASONE SODIUM PHOSPHATE 10 MG/ML IJ SOLN
INTRAMUSCULAR | Status: AC
  Filled 2023-07-02: qty 1

## 2023-07-02 MED ORDER — ONDANSETRON HCL 4 MG/2ML IJ SOLN: 4.0000 mg | Freq: Four times a day (QID) | INTRAMUSCULAR | Status: DC | PRN

## 2023-07-02 MED ORDER — MAGIC MOUTHWASH: 15.0000 mL | Freq: Four times a day (QID) | ORAL | Status: DC | PRN

## 2023-07-02 MED ORDER — GABAPENTIN 300 MG PO CAPS
300.0000 mg | ORAL_CAPSULE | Freq: Two times a day (BID) | ORAL | Status: DC
  Administered 2023-07-02 – 2023-07-05 (×6): 300 mg via ORAL
  Filled 2023-07-02 (×6): qty 1

## 2023-07-02 MED ORDER — PHENOL 1.4 % MT LIQD: 2.0000 | OROMUCOSAL | Status: DC | PRN

## 2023-07-02 MED ORDER — CELECOXIB 200 MG PO CAPS
200.0000 mg | ORAL_CAPSULE | ORAL | Status: AC
  Administered 2023-07-02: 200 mg via ORAL
  Filled 2023-07-02: qty 1

## 2023-07-02 MED ORDER — ADULT MULTIVITAMIN W/MINERALS CH
1.0000 | ORAL_TABLET | Freq: Every morning | ORAL | Status: DC
  Administered 2023-07-03 – 2023-07-05 (×3): 1 via ORAL
  Filled 2023-07-02 (×3): qty 1

## 2023-07-02 MED ORDER — LIDOCAINE HCL (PF) 2 % IJ SOLN
INTRAMUSCULAR | Status: AC
  Filled 2023-07-02: qty 5

## 2023-07-02 MED ORDER — PHENYLEPHRINE HCL-NACL 20-0.9 MG/250ML-% IV SOLN
INTRAVENOUS | Status: DC | PRN
  Administered 2023-07-02: 30 ug/min via INTRAVENOUS

## 2023-07-02 MED ORDER — BUPIVACAINE LIPOSOME 1.3 % IJ SUSP: 20.0000 mL | Freq: Once | INTRAMUSCULAR | Status: DC

## 2023-07-02 MED ORDER — PROCHLORPERAZINE MALEATE 10 MG PO TABS: 10.0000 mg | ORAL_TABLET | Freq: Four times a day (QID) | ORAL | Status: DC | PRN

## 2023-07-02 MED ORDER — ORAL CARE MOUTH RINSE: 15.0000 mL | Freq: Once | OROMUCOSAL | Status: AC

## 2023-07-02 MED ORDER — LACTATED RINGERS IR SOLN
Status: DC | PRN
  Administered 2023-07-02: 1000 mL

## 2023-07-02 MED ORDER — DEXAMETHASONE SODIUM PHOSPHATE 4 MG/ML IJ SOLN
INTRAMUSCULAR | Status: DC | PRN
  Administered 2023-07-02: 5 mg via INTRAVENOUS

## 2023-07-02 MED ORDER — OXYCODONE HCL 5 MG/5ML PO SOLN: 5.0000 mg | Freq: Once | ORAL | Status: DC | PRN

## 2023-07-02 MED ORDER — PHENYLEPHRINE HCL (PRESSORS) 10 MG/ML IV SOLN
INTRAVENOUS | Status: DC | PRN
  Administered 2023-07-02: 80 ug via SUBCUTANEOUS
  Administered 2023-07-02 (×3): 100 ug via SUBCUTANEOUS

## 2023-07-02 MED ORDER — ALBUMIN HUMAN 5 % IV SOLN
INTRAVENOUS | Status: AC
  Filled 2023-07-02: qty 500

## 2023-07-02 MED ORDER — METOCLOPRAMIDE HCL 5 MG/ML IJ SOLN: 5.0000 mg | Freq: Three times a day (TID) | INTRAMUSCULAR | Status: DC | PRN

## 2023-07-02 MED ORDER — ONDANSETRON HCL 4 MG PO TABS: 4.0000 mg | ORAL_TABLET | Freq: Three times a day (TID) | ORAL | 12 refills | Status: AC | PRN

## 2023-07-02 MED ORDER — ALUM & MAG HYDROXIDE-SIMETH 200-200-20 MG/5ML PO SUSP
30.0000 mL | Freq: Four times a day (QID) | ORAL | Status: DC | PRN
  Administered 2023-07-03: 30 mL via ORAL
  Filled 2023-07-02: qty 30

## 2023-07-02 MED ORDER — SCOPOLAMINE 1 MG/3DAYS TD PT72
1.0000 | MEDICATED_PATCH | TRANSDERMAL | Status: DC
Start: 2023-07-02 — End: 2023-07-05
  Administered 2023-07-02: 1.5 mg via TRANSDERMAL
  Filled 2023-07-02: qty 1

## 2023-07-02 MED ORDER — ENSURE PRE-SURGERY PO LIQD: 592.0000 mL | Freq: Once | ORAL | Status: DC

## 2023-07-02 MED ORDER — PHENYLEPHRINE 80 MCG/ML (10ML) SYRINGE FOR IV PUSH (FOR BLOOD PRESSURE SUPPORT)
PREFILLED_SYRINGE | INTRAVENOUS | Status: AC
  Filled 2023-07-02: qty 10

## 2023-07-02 MED ORDER — PROPOFOL 10 MG/ML IV BOLUS
INTRAVENOUS | Status: DC | PRN
  Administered 2023-07-02: 80 mg via INTRAVENOUS
  Administered 2023-07-02: 20 mg via INTRAVENOUS

## 2023-07-02 MED ORDER — DIPHENHYDRAMINE HCL 12.5 MG/5ML PO ELIX: 12.5000 mg | ORAL_SOLUTION | Freq: Four times a day (QID) | ORAL | Status: DC | PRN

## 2023-07-02 MED ORDER — BUPIVACAINE LIPOSOME 1.3 % IJ SUSP
INTRAMUSCULAR | Status: AC
  Filled 2023-07-02: qty 20

## 2023-07-02 MED ORDER — OXYCODONE HCL 5 MG PO TABS: 5.0000 mg | ORAL_TABLET | Freq: Once | ORAL | Status: DC | PRN

## 2023-07-02 MED ORDER — STERILE WATER FOR IRRIGATION IR SOLN
Status: DC | PRN
  Administered 2023-07-02: 1000 mL

## 2023-07-02 MED ORDER — OXYCODONE HCL 5 MG PO TABS
5.0000 mg | ORAL_TABLET | ORAL | Status: DC | PRN
  Administered 2023-07-02 – 2023-07-04 (×8): 10 mg via ORAL
  Administered 2023-07-05: 5 mg via ORAL
  Filled 2023-07-02 (×5): qty 2
  Filled 2023-07-02 (×2): qty 1
  Filled 2023-07-02 (×3): qty 2

## 2023-07-02 MED ORDER — PANTOPRAZOLE SODIUM 40 MG PO TBEC
40.0000 mg | DELAYED_RELEASE_TABLET | Freq: Two times a day (BID) | ORAL | Status: DC
  Administered 2023-07-02 – 2023-07-05 (×6): 40 mg via ORAL
  Filled 2023-07-02 (×6): qty 1

## 2023-07-02 MED ORDER — CALCIUM POLYCARBOPHIL 625 MG PO TABS
625.0000 mg | ORAL_TABLET | Freq: Two times a day (BID) | ORAL | Status: DC
  Administered 2023-07-02 – 2023-07-05 (×7): 625 mg via ORAL
  Filled 2023-07-02 (×7): qty 1

## 2023-07-02 MED ORDER — KETAMINE HCL 50 MG/5ML IJ SOSY
PREFILLED_SYRINGE | INTRAMUSCULAR | Status: AC
  Filled 2023-07-02: qty 5

## 2023-07-02 MED ORDER — HYDROMORPHONE HCL 2 MG/ML IJ SOLN
INTRAMUSCULAR | Status: AC
  Filled 2023-07-02: qty 1

## 2023-07-02 MED ORDER — METOPROLOL TARTRATE 5 MG/5ML IV SOLN: 5.0000 mg | Freq: Four times a day (QID) | INTRAVENOUS | Status: DC | PRN

## 2023-07-02 MED ORDER — DEXAMETHASONE SODIUM PHOSPHATE 4 MG/ML IJ SOLN: 4.0000 mg | INTRAMUSCULAR | Status: DC

## 2023-07-02 MED ORDER — 0.9 % SODIUM CHLORIDE (POUR BTL) OPTIME
TOPICAL | Status: DC | PRN
  Administered 2023-07-02: 1000 mL

## 2023-07-02 MED ORDER — TIZANIDINE HCL 4 MG PO TABS
4.0000 mg | ORAL_TABLET | Freq: Four times a day (QID) | ORAL | Status: DC | PRN
  Administered 2023-07-03: 4 mg via ORAL
  Filled 2023-07-02: qty 1

## 2023-07-02 MED ORDER — MENTHOL 3 MG MT LOZG: 1.0000 | LOZENGE | OROMUCOSAL | Status: DC | PRN

## 2023-07-02 MED ORDER — NAPHAZOLINE-GLYCERIN 0.012-0.25 % OP SOLN
1.0000 [drp] | Freq: Four times a day (QID) | OPHTHALMIC | Status: DC | PRN
Start: 2023-07-02 — End: 2023-07-05

## 2023-07-02 MED ORDER — ROSUVASTATIN CALCIUM 10 MG PO TABS
10.0000 mg | ORAL_TABLET | Freq: Every day | ORAL | Status: DC
  Administered 2023-07-03 – 2023-07-05 (×3): 10 mg via ORAL
  Filled 2023-07-02 (×3): qty 1

## 2023-07-02 MED ORDER — ONDANSETRON HCL 4 MG/2ML IJ SOLN
INTRAMUSCULAR | Status: AC
  Filled 2023-07-02: qty 2

## 2023-07-02 MED ORDER — HYDROMORPHONE HCL 1 MG/ML IJ SOLN
INTRAMUSCULAR | Status: DC | PRN
  Administered 2023-07-02: .3 mg via INTRAVENOUS

## 2023-07-02 MED ORDER — GABAPENTIN 300 MG PO CAPS
300.0000 mg | ORAL_CAPSULE | ORAL | Status: AC
Start: 2023-07-02 — End: 2023-07-02
  Administered 2023-07-02: 300 mg via ORAL
  Filled 2023-07-02: qty 1

## 2023-07-02 MED ORDER — MIDAZOLAM HCL 2 MG/2ML IJ SOLN
INTRAMUSCULAR | Status: AC
  Filled 2023-07-02: qty 2

## 2023-07-02 MED ORDER — BUPIVACAINE-EPINEPHRINE 0.25% -1:200000 IJ SOLN
INTRAMUSCULAR | Status: AC
  Filled 2023-07-02: qty 1

## 2023-07-02 MED ORDER — ALBUMIN HUMAN 5 % IV SOLN
INTRAVENOUS | Status: DC | PRN
Start: 2023-07-02 — End: 2023-07-02

## 2023-07-02 MED ORDER — FENTANYL CITRATE PF 50 MCG/ML IJ SOSY
25.0000 ug | PREFILLED_SYRINGE | INTRAMUSCULAR | Status: DC | PRN
  Administered 2023-07-02: 25 ug via INTRAVENOUS
  Administered 2023-07-02: 50 ug via INTRAVENOUS

## 2023-07-02 MED ORDER — ROCURONIUM BROMIDE 10 MG/ML (PF) SYRINGE
PREFILLED_SYRINGE | INTRAVENOUS | Status: AC
  Filled 2023-07-02: qty 10

## 2023-07-02 MED ORDER — CHLORHEXIDINE GLUCONATE 0.12 % MT SOLN
15.0000 mL | Freq: Once | OROMUCOSAL | Status: AC
  Administered 2023-07-02: 15 mL via OROMUCOSAL

## 2023-07-02 MED ORDER — BUPIVACAINE-EPINEPHRINE 0.25% -1:200000 IJ SOLN
INTRAMUSCULAR | Status: DC | PRN
  Administered 2023-07-02: 50 mL

## 2023-07-02 MED ORDER — LACTATED RINGERS IV SOLN: INTRAVENOUS | Status: DC

## 2023-07-02 SURGICAL SUPPLY — 68 items
APPLICATOR COTTON TIP 6 STRL (MISCELLANEOUS) ×1 IMPLANT
APPLICATOR COTTON TIP 6IN STRL (MISCELLANEOUS) ×1
APPLIER CLIP 5 13 M/L LIGAMAX5 (MISCELLANEOUS)
BAG COUNTER SPONGE SURGICOUNT (BAG) ×1 IMPLANT
BLADE SURG SZ11 CARB STEEL (BLADE) ×1 IMPLANT
CHLORAPREP W/TINT 26 (MISCELLANEOUS) ×1 IMPLANT
CLIP APPLIE 5 13 M/L LIGAMAX5 (MISCELLANEOUS) IMPLANT
COVER SURGICAL LIGHT HANDLE (MISCELLANEOUS) ×1 IMPLANT
COVER TIP SHEARS 8 DVNC (MISCELLANEOUS) IMPLANT
DRAIN CHANNEL 19F RND (DRAIN) IMPLANT
DRAIN PENROSE 0.5X18 (DRAIN) IMPLANT
DRAPE ARM DVNC X/XI (DISPOSABLE) ×4 IMPLANT
DRAPE COLUMN DVNC XI (DISPOSABLE) ×1 IMPLANT
DRAPE WARM FLUID 44X44 (DRAPES) ×1 IMPLANT
DRIVER NDL LRG 8 DVNC XI (INSTRUMENTS) ×2 IMPLANT
DRIVER NDL MEGA SUTCUT DVNCXI (INSTRUMENTS) ×1 IMPLANT
DRIVER NDLE LRG 8 DVNC XI (INSTRUMENTS) ×2
DRIVER NDLE MEGA SUTCUT DVNCXI (INSTRUMENTS) ×1
DRSG TEGADERM 2-3/8X2-3/4 SM (GAUZE/BANDAGES/DRESSINGS) ×5 IMPLANT
DRSG TEGADERM 4X4.75 (GAUZE/BANDAGES/DRESSINGS) IMPLANT
ELECT REM PT RETURN 15FT ADLT (MISCELLANEOUS) ×1 IMPLANT
ENDOLOOP SUT PDS II 0 18 (SUTURE) IMPLANT
EVACUATOR DRAINAGE 10X20 100CC (DRAIN) IMPLANT
EVACUATOR SILICONE 100CC (DRAIN) IMPLANT
FELT TEFLON 4 X1 (Mesh General) ×1 IMPLANT
FORCEPS PROGRASP DVNC XI (FORCEP) ×1 IMPLANT
GAUZE SPONGE 2X2 8PLY STRL LF (GAUZE/BANDAGES/DRESSINGS) ×1 IMPLANT
GLOVE ECLIPSE 8.0 STRL XLNG CF (GLOVE) ×2 IMPLANT
GLOVE INDICATOR 8.0 STRL GRN (GLOVE) ×2 IMPLANT
GOWN STRL REUS W/ TWL XL LVL3 (GOWN DISPOSABLE) ×4 IMPLANT
GRASPER SUT TROCAR 14GX15 (MISCELLANEOUS) IMPLANT
GRASPER TIP-UP FEN DVNC XI (INSTRUMENTS) ×1 IMPLANT
IRRIG SUCT STRYKERFLOW 2 WTIP (MISCELLANEOUS) ×1
IRRIGATION SUCT STRKRFLW 2 WTP (MISCELLANEOUS) ×1 IMPLANT
KIT BASIN OR (CUSTOM PROCEDURE TRAY) ×1 IMPLANT
KIT TURNOVER KIT A (KITS) IMPLANT
MESH BIO-A 7X10 SYN MAT (Mesh General) IMPLANT
NDL HYPO 22X1.5 SAFETY MO (MISCELLANEOUS) ×1 IMPLANT
NEEDLE HYPO 22X1.5 SAFETY MO (MISCELLANEOUS) ×1
PACK CARDIOVASCULAR III (CUSTOM PROCEDURE TRAY) ×1 IMPLANT
PAD POSITIONING PINK XL (MISCELLANEOUS) ×1 IMPLANT
PENCIL SMOKE EVACUATOR (MISCELLANEOUS) IMPLANT
SCISSORS LAP 5X45 EPIX DISP (ENDOMECHANICALS) IMPLANT
SCISSORS MNPLR CVD DVNC XI (INSTRUMENTS) ×1 IMPLANT
SEAL UNIV 5-12 XI (MISCELLANEOUS) ×3 IMPLANT
SEALER VESSEL EXT DVNC XI (MISCELLANEOUS) ×1 IMPLANT
SOL ELECTROSURG ANTI STICK (MISCELLANEOUS) ×1
SOLUTION ELECTROSURG ANTI STCK (MISCELLANEOUS) ×1 IMPLANT
SPIKE FLUID TRANSFER (MISCELLANEOUS) ×1 IMPLANT
STOPCOCK 4 WAY LG BORE MALE ST (IV SETS) ×2 IMPLANT
SUT ETHIBOND 0 36 GRN (SUTURE) ×2 IMPLANT
SUT ETHIBOND NAB CT1 #1 30IN (SUTURE) ×3 IMPLANT
SUT MNCRL AB 4-0 PS2 18 (SUTURE) ×1 IMPLANT
SUT MON AB 4-0 RB1 27 (SUTURE) IMPLANT
SUT PDS AB 1 CT1 27 (SUTURE) IMPLANT
SUT PROLENE 2 0 SH DA (SUTURE) IMPLANT
SUT STRATA PDS 2-0 23 CT-1 (SUTURE) IMPLANT
SUT V-LOC BARB 180 2/0GR6 GS22 (SUTURE) ×1
SUT VICRYL 0 UR6 27IN ABS (SUTURE) IMPLANT
SUT VLOC 180 2-0 9IN GS21 (SUTURE) IMPLANT
SUTURE V-LC BRB 180 2/0GR6GS22 (SUTURE) IMPLANT
SYR 10ML LL (SYRINGE) ×1 IMPLANT
SYR 20ML LL LF (SYRINGE) ×1 IMPLANT
TOWEL OR 17X26 10 PK STRL BLUE (TOWEL DISPOSABLE) ×1 IMPLANT
TRAY FOLEY MTR SLVR 14FR STAT (SET/KITS/TRAYS/PACK) IMPLANT
TRAY FOLEY MTR SLVR 16FR STAT (SET/KITS/TRAYS/PACK) IMPLANT
TROCAR ADV FIXATION 5X100MM (TROCAR) ×1 IMPLANT
TUBING INSUFFLATION 10FT LAP (TUBING) ×1 IMPLANT

## 2023-07-02 NOTE — H&P (Signed)
07/02/2023    REFERRING PHYSICIAN: Franky Macho, MD  Patient Care Team: Tommie Sams, DO as PCP - General (Family Medicine) Michaell Cowing, Shawn Route, MD as Consulting Provider (General Surgery) Ahmed, Juanetta Beets, MD (Gastroenterology)  PROVIDER: Jarrett Soho, MD  [facility] MRN: [id number] DOB: 01/14/1966  SUBJECTIVE   Chief Complaint: New Consultation   Shannon Castaneda is a 58 y.o. female  who is seen today as an office consultation  at the request of DrTasia Catchings  for evaluation of hiatal hernia.  History of Present Illness:  58 year old woman. Some dysphagia heartburn and reflux issues. Feels food stick. Occasional nausea and vomiting. She has been on a PPI for a while. Has not stopped the retching and dysphagia episodes. Tends to be constipated. Some abdominal pain. Had CAT scan done couple months ago that noted a large hiatal hernia. Sent to see gastroenterology who confirmed 9 cm hiatal hernia on endoscopy.. Given concerns and symptoms surgical consultation offered.  She comes today with her boyfriend. She is had heartburn reflux issues for many years. The Protonix does not help as much. She is on double dose now. She notes food will get sick often. She has had episodes of nausea and vomiting. Will get some chest pain and discomfort. Postprandial only. Has constipation where she moves her bowels maybe twice a week. She was started on a "powder" that she is taking once a day. Does not know if its helped much yet. She can walk several miles any difficulty. Does not smoke. No diabetes. She is never had any abdominal surgery. He does take Mobic for some joint pain occasionally. EGD did note some chronic gastritis and esophagitis.  Medical History:  Past Medical History:  Diagnosis Date  GERD (gastroesophageal reflux disease)  Hyperlipidemia   Patient Active Problem List  Diagnosis  Incarcerated hiatal hernia  Nausea and vomiting  Gastroesophageal reflux disease with  esophagitis without hemorrhage  Chronic constipation  Kyphoscoliosis  Chronic superficial gastritis without bleeding  History of adenomatous polyp of colon   History reviewed. No pertinent surgical history.   No Known Allergies  Current Outpatient Medications on File Prior to Visit  Medication Sig Dispense Refill  alendronate (FOSAMAX) 70 MG tablet Take 70 mg by mouth  CALCIUM CARBONATE-VITAMIN D2 ORAL Take 1 tablet by mouth  meloxicam (MOBIC) 7.5 MG tablet TAKE 1 TABLET(7.5 MG) BY MOUTH DAILY AS NEEDED FOR PAIN  pantoprazole (PROTONIX) 40 MG DR tablet Take 40 mg by mouth 2 (two) times daily before meals  rosuvastatin (CRESTOR) 10 MG tablet TAKE 1 TABLET(10 MG) BY MOUTH DAILY FOR CHOLESTEROL  tiZANidine (ZANAFLEX) 4 MG tablet Take one tab po qhs prn muscle spasms   No current facility-administered medications on file prior to visit.   Family History  Problem Relation Age of Onset  Skin cancer Mother  High blood pressure (Hypertension) Mother  Hyperlipidemia (Elevated cholesterol) Mother  Coronary Artery Disease (Blocked arteries around heart) Mother  Deep vein thrombosis (DVT or abnormal blood clot formation) Mother  Stroke Sister  Hyperlipidemia (Elevated cholesterol) Sister  Hyperlipidemia (Elevated cholesterol) Brother  High blood pressure (Hypertension) Brother    Social History   Tobacco Use  Smoking Status Never  Smokeless Tobacco Never    Social History   Socioeconomic History  Marital status: Single  Tobacco Use  Smoking status: Never  Smokeless tobacco: Never  Substance and Sexual Activity  Alcohol use: Never  Drug use: Never   Social Drivers of Corporate investment banker Strain: Low  Risk (05/15/2022)  Received from [facility]  Overall Financial Resource Strain (CARDIA)  Difficulty of Paying Living Expenses: Not hard at all  Food Insecurity: No Food Insecurity (05/15/2022)  Received from [facility]  Hunger Vital Sign  Worried About Running Out  of Food in the Last Year: Never true  Ran Out of Food in the Last Year: Never true  Transportation Needs: No Transportation Needs (05/15/2022)  Received from [facility] - Transportation  Lack of Transportation (Medical): No  Lack of Transportation (Non-Medical): No  Physical Activity: Inactive (05/15/2022)  Received from [facility]  Exercise Vital Sign  Days of Exercise per Week: 0 days  Minutes of Exercise per Session: 0 min  Stress: No Stress Concern Present (05/15/2022)  Received from [facility] - Occupational Stress Questionnaire  Feeling of Stress : Not at all  Social Connections: Socially Isolated (05/15/2022)  Received from [facility]  Social Connection and Isolation Panel [NHANES]  Frequency of Communication with Friends and Family: More than three times a week  Frequency of Social Gatherings with Friends and Family: Three times a week  Attends Religious Services: Never  Active Member of Clubs or Organizations: No  Attends Banker Meetings: Never  Marital Status: Never married   ############################################################  Review of Systems: A complete review of systems (ROS) was obtained from the patient.  We have reviewed this information and discussed as appropriate with the patient.  See HPI as well for other pertinent ROS.  Constitutional: No fevers, chills, sweats. Weight stable Eyes: No vision changes, No discharge HENT: No sore throats, nasal drainage Lymph: No neck swelling, No bruising easily Pulmonary: No cough, productive sputum CV: No orthopnea, PND . No exertional chest/neck/shoulder/arm pain. Patient can walk 2-3 miles without difficulty.   GI: No personal nor family history of GI/colon cancer, inflammatory bowel disease, irritable bowel syndrome, allergy such as Celiac Sprue, dietary/dairy problems, colitis, ulcers nor gastritis. No recent sick contacts/gastroenteritis.  No travel outside the country. No changes in diet.  Renal: No UTIs, No hematuria Genital: No drainage, bleeding, masses Musculoskeletal: No severe joint pain. Good ROM major joints Skin: No sores or lesions Heme/Lymph: No easy bleeding. No swollen lymph nodes Neuro: No active seizures. No facial droop Psych: No hallucinations. No agitation  OBJECTIVE   Vitals:  04/06/23 1027  BP: 128/60  Pulse: 84  Temp: 36.1 C (97 F)  SpO2: 97%  Weight: 55.3 kg (122 lb)  Height: 170.2 cm (5\' 7" )  PainSc: 6  PainLoc: Abdomen   Body mass index is 19.11 kg/m.  PHYSICAL EXAM:  Constitutional: Not cachectic. Hygeine adequate. Vitals signs as above.  Eyes: Wears glasses - vision corrected,Pupils reactive, normal extraocular movements. Sclera nonicteric Neuro: CN II-XII intact. No major focal sensory defects. No major motor deficits. Lymph: No head/neck/groin lymphadenopathy Psych: No severe agitation. No severe anxiety. Judgment & insight Adequate, Oriented x4, HENT: Normocephalic, Mucus membranes moist. No thrush. Hearing: adequate Neck: Supple, No tracheal deviation. No obvious thyromegaly Chest: No pain to chest wall compression. Good respiratory excursion. No audible wheezing CV: Pulses intact. regular. No major extremity edema Ext: No obvious deformity or contracture. Edema: Not present. No cyanosis Skin: No major subcutaneous nodules. Warm and dry Musculoskeletal: Severe joint rigidity not present. No obvious clubbing. No digital petechiae. Mobility: no assist device moving easily without restrictions. Mild kyphoscoliosis of thoracic spine  Abdomen: Flat Soft. Nondistended. Nontender. Hernia: Not present. Diastasis recti: Not present. No hepatomegaly. No  splenomegaly.  Genital/Pelvic: Inguinal hernia: Not present. Inguinal lymph nodes: without lymphadenopathy nor hidradenitis.   Rectal: (Deferred)    ###################################################################  Labs,  Imaging and Diagnostic Testing:  Located in 'Care Everywhere' section of Epic EMR chart  PRIOR [facility] CLINIC NOTES:  Not applicable  SURGERY NOTES:  Not applicable  PATHOLOGY:  Located in 'Care Everywhere' section of Epic EMR chart  FINAL MICROSCOPIC DIAGNOSIS:   A. STOMACH, RANDOM, BIOPSY:  - Antral mucosa with mild to moderate chronic inactive gastritis.  - An immunohistochemical stain for Helicobacter pylori organisms is  negative.   B. ESOPHAGUS, RANDOM, BIOPSY:  - Squamous mucosa with no significant pathology.   C. COLON, APPENDICEAL ORIFICE, CECUM, DESCENDING, POLYPECTOMY:  - Tubular adenoma, fragments.   D. COLON, SIGMOID, POLYPECTOMY:  - Tubular adenoma.   Assessment and Plan:  DIAGNOSES:  Diagnoses and all orders for this visit:  Incarcerated hiatal hernia  Nausea and vomiting, unspecified vomiting type  Gastroesophageal reflux disease with esophagitis without hemorrhage  Chronic constipation  Kyphoscoliosis  History of adenomatous polyp of colon  Chronic superficial gastritis without bleeding    ASSESSMENT/PLAN  Pleasant woman with significant hiatal hernia with at least 50% of her stomach incarcerated up in her chest with poorly controlled esophagitis, chronic gastritis, intermittent nausea vomiting and chest pain.  I think she would benefit from surgery to get the stomach out of her chest and repair her hiatal hernia. Fundoplication to repair her LES/heartburn valve as well.  The anatomy & physiology of the foregut and anti-reflux mechanism was discussed. The pathophysiology of hiatal herniation and GERD was discussed. Natural history risks without surgery was discussed. The patient's symptoms are not adequately controlled by medicines and other non-operative treatments. I feel the risks of no intervention will lead to serious problems that outweigh the operative risks; therefore, I recommended surgery to reduce the hiatal hernia out of the chest  and fundoplication to rebuild the anti-reflux valve and control reflux better. Need for a thorough workup to rule out the differential diagnosis and plan treatment was explained. I explained minimally invasive techniques with possible need for an open approach.  Risks such as bleeding, infection, abscess, leak,injury to other organs, need for repair of tissues / organs, need for further treatment, stroke, heart attack, death, and other risks were discussed. I noted a good likelihood this will help address the problem. Goals of post-operative recovery were discussed as well. Possibility that this will not correct all symptoms was explained. Post-operative dysphagia, need for short-term liquid & pureed diet, inability to vomit, possibility of reherniation, possible need for medicines to help control symptoms in addition to surgery were discussed. We will work to minimize complications. Educational handouts further explaining the pathology, treatment options, and dysphagia diet was given as well. Questions were answered. The patient expresses understanding & wishes to proceed with surgery.  She needs esophageal manometry to make sure she does not have any primary esophageal disorders and see if she can tolerate a partial versus complete fundoplication. See if we get that through [facility] gastrology or do gastroenterology depending on availability.  She has good performance status and does not smoke with no cardiac or pulmonary issues so I do not feel strongly that she needs any more medical clearance.  Ardeth Sportsman, MD, FACS, MASCRS Esophageal, Gastrointestinal & Colorectal Surgery Robotic and Minimally Invasive Surgery  [facility] [address] [place], [place] [address] [phone] Fax [phone] Main  CONTACT  INFORMATION: Weekday (9AM-5PM): Call [facility] main office at [phone] Weeknight (5PM-9AM) or Weekend/Holiday: Check  EPIC "Web Links" tab & use "AMION" (password " [email or web address]") for General Surgery [facility] coverage  Please, DO NOT use SecureChat  (it is not reliable communication to reach operating surgeons & will lead to a delay in care).   Epic staff messaging available for outptient concerns needing 1-2 business day response.      07/02/2023

## 2023-07-02 NOTE — Discharge Instructions (Signed)
EATING AFTER YOUR ESOPHAGEAL SURGERY (Stomach Fundoplication, Hiatal Hernia repair, Achalasia surgery, etc)  ######################################################################  EAT Start with a pureed / full liquid diet (see below) Gradually transition to a high fiber diet with a fiber supplement over the next month after discharge.    WALK Walk an hour a day.  Control your pain to do that.    CONTROL PAIN Control pain so that you can walk, sleep, tolerate sneezing/coughing, go up/down stairs.  HAVE A BOWEL MOVEMENT DAILY Keep your bowels regular to avoid problems.  OK to try a laxative to override constipation.  OK to use an antidairrheal to slow down diarrhea.  Call if not better after 2 tries  CALL IF YOU HAVE PROBLEMS/CONCERNS Call if you are still struggling despite following these instructions. Call if you have concerns not answered by these instructions  ######################################################################   After your esophageal surgery, expect some sticking with swallowing over the next 1-2 months.    If food sticks when you eat, it is called "dysphagia".  This is due to swelling around your esophagus at the wrap & hiatal diaphragm repair.  It will gradually ease off over the next few months.  To help you through this temporary phase, we start you out on a pureed (blenderized) diet.  Your first meal in the hospital was thin liquids.  You should have been given a pureed diet by the time you left the hospital.  We ask patients to stay on a pureed diet for the first 2-3 weeks to avoid anything getting "stuck" near your recent surgery.  Don't be alarmed if your ability to swallow doesn't progress according to this plan.  Everyone is different and some diets can advance more or less quickly.    It is often helpful to crush your medications or split them as they can sometimes stick, especially the first week or so.   Some BASIC RULES to follow are: Maintain  an upright position whenever eating or drinking. Take small bites - just a teaspoon size bite at a time. Eat slowly.  It may also help to eat only one food at a time. Consider nibbling through smaller, more frequent meals & avoid the urge to eat BIG meals Do not push through feelings of fullness, nausea, or bloatedness Do not mix solid foods and liquids in the same mouthful Try not to "wash foods down" with large gulps of liquids. Avoid carbonated (bubbly/fizzy) drinks.   Avoid foods that make you feel gassy or bloated.  Start with bland foods first.  Wait on trying greasy, fried, or spicy meals until you are tolerating more bland solids well. Understand that it will be hard to burp and belch at first.  This gradually improves with time.  Expect to be more gassy/flatulent/bloated initially.  Walking will help your body manage it better. Consider using medications for bloating that contain simethicone such as  Maalox or Gas-X  Consider crushing her medications, especially smaller pills.  The ability to swallow pills should get easier after a few weeks Eat in a relaxed atmosphere & minimize distractions. Avoid talking while eating.   Do not use straws. Following each meal, sit in an upright position (90 degree angle) for 60 to 90 minutes.  Going for a short walk can help as well If food does stick, don't panic.  Try to relax and let the food pass on its own.  Sipping WARM LIQUID such as strong hot black tea can also help slide it down.  Be gradual in changes & use common sense:  -If you easily tolerating a certain "level" of foods, advance to the next level gradually -If you are having trouble swallowing a particular food, then avoid it.   -If food is sticking when you advance your diet, go back to thinner previous diet (the lower LEVEL) for 1-2 days.  LEVEL 1 = PUREED DIET  Do for the first 2 WEEKS AFTER SURGERY  -Foods in this group are pureed or blenderized to a smooth, mashed  potato-like consistency.  -If necessary, the pureed foods can keep their shape with the addition of a thickening agent.   -Meat should be pureed to a smooth, pasty consistency.  Hot broth or gravy may be added to the pureed meat, approximately 1 oz. of liquid per 3 oz. serving of meat. -CAUTION:  If any foods do not puree into a smooth consistency, swallowing will be more difficult.  (For example, nuts or seeds sometimes do not blend well.)  Hot Foods Cold Foods  Pureed scrambled eggs and cheese Pureed cottage cheese  Baby cereals Thickened juices and nectars  Thinned cooked cereals (no lumps) Thickened milk or eggnog  Pureed [place] toast or pancakes Ensure  Mashed potatoes Ice cream  Pureed parsley, au gratin, scalloped potatoes, candied sweet potatoes Fruit or [place] ice, sherbet  Pureed buttered or alfredo noodles Plain yogurt  Pureed vegetables (no corn or peas) Instant breakfast  Pureed soups and creamed soups Smooth pudding, mousse, custard  Pureed scalloped apples Whipped gelatin  Gravies Sugar, syrup, honey, jelly  Sauces, cheese, tomato, barbecue, white, creamed Cream  Any baby food Creamer  Alcohol in moderation (not beer or champagne) Margarine  Coffee or tea Mayonnaise   Ketchup, mustard   Apple sauce   SAMPLE MENU:  PUREED DIET Breakfast Lunch Dinner  Orange juice, 1/2 cup Cream of wheat, 1/2 cup Pineapple juice, 1/2 cup Pureed [place], barley soup, 3/4 cup Pureed Hawaiian chicken, 3 oz  Scrambled eggs, mashed or blended with cheese, 1/2 cup Tea or coffee, 1 cup  Whole milk, 1 cup  Non-dairy creamer, 2 Tbsp. Mashed potatoes, 1/2 cup Pureed cooled broccoli, 1/2 cup Apple sauce, 1/2 cup Coffee or tea Mashed potatoes, 1/2 cup Pureed spinach, 1/2 cup Frozen yogurt, 1/2 cup Tea or coffee      LEVEL 2 = SOFT DIET  After your first 2 weeks, you can advance to a soft diet.   Keep on this diet until everything goes down easily.  Hot Foods Cold Foods  White fish  Cottage cheese  Stuffed fish Junior baby fruit  Baby food meals Semi thickened juices  Minced soft cooked, scrambled, poached eggs nectars  Souffle & omelets Ripe mashed bananas  Cooked cereals Canned fruit, pineapple sauce, milk  potatoes Milkshake  Buttered or Alfredo noodles Custard  Cooked cooled vegetable Puddings, including tapioca  Sherbet Yogurt  Vegetable soup or alphabet soup Fruit ice, [place] ice  Gravies Whipped gelatin  Sugar, syrup, honey, jelly Junior baby desserts  Sauces:  Cheese, creamed, barbecue, tomato, white Cream  Coffee or tea Margarine   SAMPLE MENU:  LEVEL 2 Breakfast Lunch Dinner  Orange juice, 1/2 cup Oatmeal, 1/2 cup Scrambled eggs with cheese, 1/2 cup Decaffeinated tea, 1 cup Whole milk, 1 cup Non-dairy creamer, 2 Tbsp Pineapple juice, 1/2 cup Minced beef, 3 oz Gravy, 2 Tbsp Mashed potatoes, 1/2 cup Minced fresh broccoli, 1/2 cup Applesauce, 1/2 cup Coffee, 1 cup [place], barley soup, 3/4 cup Minced Hawaiian chicken, 3 oz  Mashed potatoes, 1/2 cup Cooked spinach, 1/2 cup Frozen yogurt, 1/2 cup Non-dairy creamer, 2 Tbsp      LEVEL 3 = CHOPPED DIET  -After all the foods in level 2 (soft diet) are passing through well you should advance up to more chopped foods.  -It is still important to cut these foods into small pieces and eat slowly.  Hot Foods Cold Foods  Poultry Cottage cheese  Chopped Swedish meatballs Yogurt  Meat salads (ground or flaked meat) Milk  Flaked fish (tuna) Milkshakes  Poached or scrambled eggs Soft, cold, dry cereal  Souffles and omelets Fruit juices or nectars  Cooked cereals Chopped canned fruit  Chopped [place] toast or pancakes Canned fruit cocktail  Noodles or pasta (no rice) Pudding, mousse, custard  Cooked vegetables (no frozen peas, corn, or mixed vegetables) Green salad  Canned small sweet peas Ice cream  Creamed soup or vegetable soup Fruit ice, [place] ice  Pureed vegetable soup or alphabet soup  Non-dairy creamer  Ground scalloped apples Margarine  Gravies Mayonnaise  Sauces:  Cheese, creamed, barbecue, tomato, white Ketchup  Coffee or tea Mustard   SAMPLE MENU:  LEVEL 3 Breakfast Lunch Dinner  Orange juice, 1/2 cup Oatmeal, 1/2 cup Scrambled eggs with cheese, 1/2 cup Decaffeinated tea, 1 cup Whole milk, 1 cup Non-dairy creamer, 2 Tbsp Ketchup, 1 Tbsp Margarine, 1 tsp Salt, 1/4 tsp Sugar, 2 tsp Pineapple juice, 1/2 cup Ground beef, 3 oz Gravy, 2 Tbsp Mashed potatoes, 1/2 cup Cooked spinach, 1/2 cup Applesauce, 1/2 cup Decaffeinated coffee Whole milk Non-dairy creamer, 2 Tbsp Margarine, 1 tsp Salt, 1/4 tsp Pureed [place], barley soup, 3/4 cup Barbecue chicken, 3 oz Mashed potatoes, 1/2 cup Ground fresh broccoli, 1/2 cup Frozen yogurt, 1/2 cup Decaffeinated tea, 1 cup Non-dairy creamer, 2 Tbsp Margarine, 1 tsp Salt, 1/4 tsp Sugar, 1 tsp    LEVEL 4:  REGULAR FOODS  -Foods in this group are soft, moist, regularly textured foods.   -This level includes meat and breads, which tend to be the hardest things to swallow.   -Eat very slowly, chew well and continue to avoid carbonated drinks. -most people are at this level in 4-6 weeks  Hot Foods Cold Foods  Baked fish or skinned Soft cheeses - cottage cheese  Souffles and omelets Cream cheese  Eggs Yogurt  Stuffed shells Milk  Spaghetti with meat sauce Milkshakes  Cooked cereal Cold dry cereals (no nuts, dried fruit, coconut)  [place] toast or pancakes Crackers  Buttered toast Fruit juices or nectars  Noodles or pasta (no rice) Canned fruit  Potatoes (all types) Ripe bananas  Soft, cooked vegetables (no corn, lima, or baked beans) Peeled, ripe, fresh fruit  Creamed soups or vegetable soup Cakes (no nuts, dried fruit, coconut)  Canned chicken noodle soup Plain doughnuts  Gravies Ice cream  Bacon dressing Pudding, mousse, custard  Sauces:  Cheese, creamed, barbecue, tomato, white Fruit ice, [place] ice, sherbet   Decaffeinated tea or coffee Whipped gelatin  Pork chops Regular gelatin   Canned fruited gelatin molds   Sugar, syrup, honey, jam, jelly   Cream   Non-dairy   Margarine   Oil   Mayonnaise   Ketchup   Mustard   TROUBLESHOOTING IRREGULAR BOWELS  1) Avoid extremes of bowel movements (no bad constipation/diarrhea)  2) Miralax 17gm mixed in 8oz. water or juice-daily. May use BID as needed.  3) Gas-x,Phazyme, etc. as needed for gas & bloating.  4) Soft,bland diet. No spicy,greasy,fried foods.  5) Prilosec over-the-counter  as needed  6) May hold gluten/wheat products from diet to see if symptoms improve.  7) May try probiotics (Align, Activa, etc) to help calm the bowels down  7) If symptoms become worse call back immediately.    If you have any questions please call our office at [facility]: [phone].    ################################################################  LAPAROSCOPIC SURGERY: POST OP INSTRUCTIONS  ######################################################################  EAT Gradually transition to a high fiber diet with a fiber supplement over the next few weeks after discharge.  Start with a pureed / full liquid diet (see below)  WALK Walk an hour a day.  Control your pain to do that.    CONTROL PAIN Control pain so that you can walk, sleep, tolerate sneezing/coughing, go up/down stairs.  HAVE A BOWEL MOVEMENT DAILY Keep your bowels regular to avoid problems.  OK to try a laxative to override constipation.  OK to use an antidairrheal to slow down diarrhea.  Call if not better after 2 tries  CALL IF YOU HAVE PROBLEMS/CONCERNS Call if you are still struggling despite following these instructions. Call if you have concerns not answered by these instructions  ######################################################################    DIET: See Esophageal Surgery diet instructions above  Take your usually prescribed home medications unless  otherwise directed. Blood thinners:  You can restart any strong blood thinners after the second postoperative day  for example: COUMADIN (warfarin), XERELTO (rivaroxaban), ELIQUIS (apixaban), PLAVIX (clopidigrel), BRILINTA (ticagrelor), EFFIENT (prasugrel), PRADAXA (dabigatran), etc  Continue aspirin before & after surgery..     Some oozing/bleeding the first 1-2 weeks is common but should taper down & be small volume.    If you are passing many large clots or having uncontrolling bleeding, call your surgeon  PAIN CONTROL: Pain is best controlled by a usual combination of three different methods TOGETHER: Ice/Heat Over the counter pain medication Prescription pain medication Most patients will experience some swelling and bruising around the incisions.  Ice packs or heating pads (30-60 minutes up to 6 times a day) will help. Use ice for the first few days to help decrease swelling and bruising, then switch to heat to help relax tight/sore spots and speed recovery.  Some people prefer to use ice alone, heat alone, alternating between ice & heat.  Experiment to what works for you.  Swelling and bruising can take several weeks to resolve.   It is helpful to take an over-the-counter pain medication regularly for the first few weeks.  Choose one of the following that works best for you: Naproxen (Aleve, etc)  Two 220mg  tabs twice a day Ibuprofen (Advil, etc) Three 200mg  tabs four times a day (every meal & bedtime) Acetaminophen (Tylenol, etc) 500-650mg  four times a day (every meal & bedtime) A  prescription for pain medication (such as oxycodone, hydrocodone, tramadol, gabapentin, methocarbamol, etc) should be given to you upon discharge.  Take your pain medication as prescribed.  If you are having problems/concerns with the prescription medicine (does not control pain, nausea, vomiting, rash, itching, etc), please call us [phone] to see if we need to switch you to a different pain medicine  that will work better for you and/or control your side effect better. If you need a refill on your pain medication, please give [place] 48 hour notice.  contact your pharmacy.  They will contact our office to request authorization. Prescriptions will not be filled after 5 pm or on week-ends  AVOID GETTING CONSTIPATED.   a.  Between the surgery and the pain  medications, it is common to experience some constipation.  b.  Drink plenty of liquids c   ake a fiber supplement 2 times day (such as Metamucil, Citrucel, FiberCon, MiraLax, etc) to have a bowel movement every day. d.  If you have not had a BM by 2 days after surgery: -drink liquids only until you have a bowel movement - take MiraLAX 2 doses every 2 hours until you have a bowel movement   Watch out for diarrhea.   If you have many loose bowel movements, simplify your diet to bland foods & liquids for a few days.   Stop any stool softeners and decrease your fiber supplement.   Switching to mild anti-diarrheal medications (Kayopectate, Pepto Bismol) can help.   If this worsens or does not improve, please call us.  Wash / shower every day.  You may shower over the dressings as they are waterproof.  Continue to shower over incision(s) after the dressing is off.  It is good for closed incisions and even open wounds to be washed every day.  Shower every day.  Short baths are fine.  Wash the incisions and wounds clean with soap & water.    You may leave closed incisions open to air if it is dry.   You may cover the incision with clean gauze & replace it after your daily shower for comfort.  TEGADERM:  You have clear gauze band-aid dressings over your closed incision(s).  Remove the dressings 2 days after surgery = 2/16.    ACTIVITIES as tolerated:   You may resume regular (light) daily activities beginning the next day--such as daily self-care, walking, climbing stairs--gradually increasing activities as tolerated.  If you can walk 30 minutes  without difficulty, it is safe to try more intense activity such as jogging, treadmill, bicycling, low-impact aerobics, swimming, etc. Save the most intensive and strenuous activity for last such as sit-ups, heavy lifting, contact sports, etc  Refrain from any heavy lifting or straining until you are off narcotics for pain control.   DO NOT PUSH THROUGH PAIN.  Let pain be your guide: If it hurts to do something, don't do it.  Pain is your body warning you to avoid that activity for another week until the pain goes down. You may drive when you are no longer taking prescription pain medication, you can comfortably wear a seatbelt, and you can safely maneuver your car and apply brakes. You may have sexual intercourse when it is comfortable.  FOLLOW UP in our office Please call [facility] at [phone] to set up an appointment to see your surgeon in the office for a follow-up appointment approximately 2-3 weeks after your surgery. Make sure that you call for this appointment the day you arrive home to insure a convenient appointment time.  10. IF YOU HAVE DISABILITY OR FAMILY LEAVE FORMS, BRING THEM TO THE OFFICE FOR PROCESSING.  DO NOT GIVE THEM TO YOUR DOCTOR.   WHEN TO CALL us [phone]: Poor pain control Reactions / problems with new medications (rash/itching, nausea, etc)  Fever over 101.5 F (38.5 C) Inability to urinate Nausea and/or vomiting Worsening swelling or bruising Continued bleeding from incision. Increased pain, redness, or drainage from the incision   The clinic staff is available to answer your questions during regular business hours (8:30am-5pm).  Please don't hesitate to call and ask to speak to one of our nurses for clinical concerns.   If you have a medical emergency, go to the nearest emergency  room or call 911.  A surgeon from [facility] is always on call at the [facility] [place] [address], [address], [place],  [place]  [address] ? MAIN: [phone] ? TOLL FREE: [phone] ?  FAX [phone] [email or web address]  ##############################################################

## 2023-07-02 NOTE — Anesthesia Preprocedure Evaluation (Signed)
Anesthesia Evaluation  Patient identified by MRN, date of birth, ID band Patient awake    Reviewed: Allergy & Precautions, NPO status , Patient's Chart, lab work & pertinent test results  History of Anesthesia Complications Negative for: history of anesthetic complications  Airway Mallampati: IV  TM Distance: <3 FB Neck ROM: Full    Dental  (+) Dental Advisory Given, Edentulous Upper, Edentulous Lower   Pulmonary neg pulmonary ROS   breath sounds clear to auscultation       Cardiovascular negative cardio ROS  Rhythm:Regular  1. Left ventricular ejection fraction, by estimation, is 55 to 60%. The  left ventricle has normal function. The left ventricle has no regional  wall motion abnormalities. Left ventricular diastolic parameters were  normal.   2. Right ventricular systolic function is normal. The right ventricular  size is normal. Tricuspid regurgitation signal is inadequate for assessing  PA pressure.   3. The mitral valve is grossly normal. Trivial mitral valve  regurgitation.   4. The aortic valve has an indeterminant number of cusps. Aortic valve  regurgitation is not visualized.   5. The inferior vena cava is normal in size with greater than 50%  respiratory variability, suggesting right atrial pressure of 3 mmHg.     Neuro/Psych neg Seizures  negative psych ROS   GI/Hepatic Neg liver ROS, hiatal hernia,GERD  ,,  Endo/Other  negative endocrine ROS    Renal/GU negative Renal ROSLab Results      Component                Value               Date                      NA                       138                 03/25/2022                K                        4.6                 03/25/2022                CO2                      23                  03/25/2022                GLUCOSE                  94                  03/25/2022                BUN                      10                  03/25/2022                 CREATININE               0.83  03/25/2022                CALCIUM                  10.0                03/25/2022                EGFR                     82                  03/25/2022                GFRNONAA                 79                  02/27/2020                Musculoskeletal  (+) Arthritis ,    Abdominal   Peds  Hematology negative hematology ROS (+)   Anesthesia Other Findings   Reproductive/Obstetrics                              Anesthesia Physical Anesthesia Plan  ASA: 2  Anesthesia Plan: General   Post-op Pain Management: Tylenol PO (pre-op)*, Celebrex PO (pre-op)* and Gabapentin PO (pre-op)*   Induction: Intravenous  PONV Risk Score and Plan: 4 or greater and Ondansetron and Dexamethasone  Airway Management Planned: Oral ETT and Video Laryngoscope Planned  Additional Equipment: None  Intra-op Plan:   Post-operative Plan: Extubation in OR  Informed Consent: I have reviewed the patients History and Physical, chart, labs and discussed the procedure including the risks, benefits and alternatives for the proposed anesthesia with the patient or authorized representative who has indicated his/her understanding and acceptance.     Dental advisory given  Plan Discussed with: CRNA  Anesthesia Plan Comments:          Anesthesia Quick Evaluation

## 2023-07-02 NOTE — Interval H&P Note (Signed)
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  The various methods of treatment have been discussed with the patient and family. After consideration of risks, benefits and other options for treatment, the patient has consented to  Procedure(s): ROBOTIC REPAIR OF PARAESOPHAGEAL HIATAL HERNIA WITH FUNDOPLICATION (N/A) as a surgical intervention.  The patient's history has been reviewed, patient examined, no change in status, stable for surgery.  I have reviewed the patient's chart and labs.  Questions were answered to the patient's satisfaction.    I have re-reviewed the the patient's records, history, medications, and allergies.  I have re-examined the patient.  I again discussed intraoperative plans and goals of post-operative recovery.  The patient agrees to proceed.  Shannon Castaneda  1965-07-19 [id number]  Patient Care Team: Tommie Sams, DO as PCP - General (Family Medicine) Michaelle Copas, MD as Referring Physician (Optometry) Oliver Barre, MD as Consulting Physician (Orthopedic Surgery)  Patient Active Problem List   Diagnosis Date Noted   Acute esophagitis 03/02/2023   Adenomatous polyp of sigmoid colon 03/02/2023   Hiatal hernia with GERD 03/02/2023   Esophageal dysphagia 02/02/2023   Chronic idiopathic constipation 02/02/2023   Elevated liver enzymes 02/02/2023   Generalized abdominal pain 02/02/2023   Encounter for screening colonoscopy 02/02/2023   Left lower quadrant abdominal pain 01/26/2023   Right shoulder pain 09/03/2022   Restless legs 02/12/2021   Fatigue 06/19/2019   Gastroesophageal reflux disease 04/28/2019   Vitamin D deficiency 04/28/2019   Degeneration of lumbar or lumbosacral intervertebral disc 03/17/2016   Chronic back pain 03/17/2016   HLD (hyperlipidemia) 03/17/2016    Past Medical History:  Diagnosis  Date   Degeneration of lumbar or lumbosacral intervertebral disc 03/17/2016   Encounter for screening colonoscopy 02/02/2023   GERD (gastroesophageal reflux disease)    History of hiatal hernia    Hyperlipidemia    Osteoporosis    Vitamin D deficiency     Past Surgical History:  Procedure Laterality Date   BIOPSY  03/02/2023   Procedure: BIOPSY;  Surgeon: Franky Macho, MD;  Location: [facility];  Service: Endoscopy;;   COLONOSCOPY WITH PROPOFOL N/A 03/02/2023   Procedure: COLONOSCOPY WITH PROPOFOL;  Surgeon: Franky Macho, MD;  Location: [facility];  Service: Endoscopy;  Laterality: N/A;  7:30am;asa 2   ESOPHAGOGASTRODUODENOSCOPY (EGD) WITH PROPOFOL N/A 03/02/2023   Procedure: ESOPHAGOGASTRODUODENOSCOPY (EGD) WITH PROPOFOL;  Surgeon: Franky Macho, MD;  [facility]: [facility];  Service: Endoscopy;  Laterality: N/A;  7:30am;asa 2   MULTIPLE EXTRACTIONS WITH ALVEOLOPLASTY  07/27/2011   Procedure: MULTIPLE EXTRACION WITH ALVEOLOPLASTY;  Surgeon: Georgia Lopes, DDS;  Location: [facility];  Service: Oral Surgery;  Laterality: Bilateral;  Insertion of denture.   POLYPECTOMY  03/02/2023   Procedure: POLYPECTOMY;  Surgeon: Franky Macho, MD;  Location: [facility];  Service: Endoscopy;;   SUBMUCOSAL LIFTING INJECTION  03/02/2023   Procedure: SUBMUCOSAL LIFTING INJECTION;  Surgeon: Franky Macho, MD;  Location: [facility];  Service: Endoscopy;;    Social History   Socioeconomic History   Marital status: Single    Spouse name: Not on file   Number of children: 1   Years of education: 93   Highest education level: Not on file  Occupational History    Comment: disability  Tobacco Use   Smoking status: Never  Passive exposure: Never   Smokeless tobacco: Never  Vaping Use   Vaping status: Never Used  Substance and Sexual Activity   Alcohol use: No   Drug use: No   Sexual activity: Yes    Birth control/protection: Pill  Other Topics Concern   Not on file   Social History Narrative   Lives with daughter age 78, granddaughter   Disabled due to "slow learner"      Lives alone    Social Drivers of Health   Financial Resource Strain: Low Risk  (05/15/2022)   Overall Financial Resource Strain (CARDIA)    Difficulty of Paying Living Expenses: Not hard at all  Food Insecurity: No Food Insecurity (05/15/2022)   Hunger Vital Sign    Worried About Running Out of Food in the Last Year: Never true    Ran Out of Food in the Last Year: Never true  Transportation Needs: No Transportation Needs (05/15/2022)   PRAPARE - Administrator, Civil Service (Medical): No    Lack of Transportation (Non-Medical): No  Physical Activity: Inactive (05/15/2022)   Exercise Vital Sign    Days of Exercise per Week: 0 days    Minutes of Exercise per Session: 0 min  Stress: No Stress Concern Present (05/15/2022)   [employer] of Occupational Health - Occupational Stress Questionnaire    Feeling of Stress : Not at all  Social Connections: Socially Isolated (05/15/2022)   Social Connection and Isolation Panel [NHANES]    Frequency of Communication with Friends and Family: More than three times a week    Frequency of Social Gatherings with Friends and Family: Three times a week    Attends Religious Services: Never    Active Member of Clubs or Organizations: No    Attends Banker Meetings: Never    Marital Status: Never married  Intimate Partner Violence: Not At Risk (05/15/2022)   Humiliation, Afraid, Rape, and Kick questionnaire    Fear of Current or Ex-Partner: No    Emotionally Abused: No    Physically Abused: No    Sexually Abused: No    Family History  Problem Relation Age of Onset   Heart disease Mother    Cancer Brother        lung    Medications Prior to Admission  Medication Sig Dispense Refill Last Dose/Taking   acetaminophen (TYLENOL) 500 MG tablet Take 1,000 mg by mouth every 6 (six) hours as needed (pain.).    Past Week   alendronate (FOSAMAX) 70 MG tablet Take 1 tablet (70 mg total) by mouth every 7 (seven) days. Take with a full glass of water on an empty stomach. (Patient taking differently: Take 70 mg by mouth every Friday. Take with a full glass of water on an empty stomach.) 12 tablet 4 06/25/2023   Multiple Vitamin (MULTIVITAMIN WITH MINERALS) TABS tablet Take 2 tablets by mouth in the morning. One A Day + Centrum Multivitamin (takes together)   06/30/2023   pantoprazole (PROTONIX) 40 MG tablet Take 1 tablet (40 mg total) by mouth 2 (two) times daily before a meal. (Patient taking differently: Take 40 mg by mouth every evening.) 180 tablet 1 Past Week   rosuvastatin (CRESTOR) 10 MG tablet TAKE 1 TABLET(10 MG) BY MOUTH DAILY FOR CHOLESTEROL 90 tablet 3 07/02/2023 at  3:00 AM   tiZANidine (ZANAFLEX) 4 MG tablet Take one tab po qhs prn muscle spasms 30 tablet 3 Past Week    Current Facility-Administered Medications  Medication  Dose Route Frequency Provider Last Rate Last Admin   bupivacaine liposome (EXPAREL) 1.3 % injection 266 mg  20 mL Infiltration Once Karie Soda, MD       cefTRIAXone (ROCEPHIN) 2 g in sodium chloride 0.9 % 100 mL IVPB  2 g Intravenous On Call to OR Karie Soda, MD       Chlorhexidine Gluconate Cloth 2 % PADS 6 each  6 each Topical Once Karie Soda, MD       And   Chlorhexidine Gluconate Cloth 2 % PADS 6 each  6 each Topical Once Karie Soda, MD       dexamethasone (DECADRON) injection 4 mg  4 mg Intravenous On Call to OR Karie Soda, MD       lactated ringers infusion   Intravenous Continuous Marcene Duos, MD 10 mL/hr at 07/02/23 0611 New Bag at 07/02/23 6045   scopolamine (TRANSDERM-SCOP) 1 MG/3DAYS 1.5 mg  1 patch Transdermal On Call to OR Karie Soda, MD   1.5 mg at 07/02/23 0556     No Known Allergies  BP 133/87   Pulse 87   Temp 98.2 F (36.8 C) (Oral)   Resp 16   Ht 5\' 7"  (1.702 m)   Wt 55.8 kg   LMP 10/27/2010   SpO2 100%   BMI 19.26 kg/m    Labs: No results found for this or any previous visit (from the past 48 hours).  Imaging / Studies: No results found.   Ardeth Sportsman, M.D., F.A.C.S. Gastrointestinal and Minimally Invasive Surgery [facility] [address], [address] [place], [place] [address] [phone] Main / Paging  07/02/2023 7:14 AM    Ardeth Sportsman

## 2023-07-02 NOTE — Transfer of Care (Signed)
Immediate Anesthesia Transfer of Care Note  Patient: Shannon Castaneda  Procedure(s) Performed: ROBOTIC REPAIR OF PARAESOPHAGEAL HIATAL HERNIA WITH MESH, PRIMARY UMBILICAL HERNIA REPAIR  Patient Location: PACU  Anesthesia Type:General  Level of Consciousness: awake, alert , and oriented  Airway & Oxygen Therapy: Patient Spontanous Breathing and Patient connected to face mask oxygen  Post-op Assessment: Report given to RN and Post -op Vital signs reviewed and stable  Post vital signs: Reviewed and stable  Last Vitals:  Vitals Value Taken Time  BP 142/76 07/02/23 1006  Temp    Pulse 110 07/02/23 1007  Resp 23 07/02/23 1006  SpO2 100 % 07/02/23 1007  Vitals shown include unfiled device data.  Last Pain:  Vitals:   07/02/23 0550  TempSrc:   PainSc: 0-No pain      Patients Stated Pain Goal: 5 (07/02/23 0550)  Complications: No notable events documented.

## 2023-07-02 NOTE — Op Note (Addendum)
07/02/2023  10:19 AM  PATIENT:  Shannon Castaneda  58 y.o. female  Patient Care Team: Tommie Sams, DO as PCP - General (Family Medicine) Michaelle Copas, MD as Referring Physician (Optometry) Oliver Barre, MD as Consulting Physician (Orthopedic Surgery)  PRE-OPERATIVE DIAGNOSIS:  Incarcerated paraesophageal hiatal hernia  Umbilical hernia 1 x 1 cm   POST-OPERATIVE DIAGNOSIS:   Incarcerated paraesophageal hiatal hernia  Umbilical hernia 1 x 1 cm   PROCEDURE:    1. ROBOTIC reduction of paraesophageal hiatal hernia 2. Type II mediastinal dissection. 3. Primary repair of hiatal hernia over pledgets.  4. Anterior & posterior gastropexy. 5. Toupet (270 degree partial posterior x 4cm)  fundoplication 6. Mesh reinforcement with absorbable mesh 7.  Primary umbilical hernia repair  SURGEON:  Ardeth Sportsman, MD  ASSISTANT:  Feliciana Rossetti, MD  An experienced assistant was required given the standard of surgical care given the complexity of the case.  This assistant was needed for exposure, dissection, suction, tissue approximation, retraction, perception, etc  ANESTHESIA:  General endotracheal intubation anesthesia (GETA) and Regional TRANSVERSUS ABDOMINIS PLANE (TAP) nerve block -BILATERAL for perioperative & postoperative pain control at the level of the transverse abdominis & preperitoneal spaces along the flank at the anterior axillary line, from subcostal ridge to iliac crest under laparoscopic guidance provided with liposomal bupivacaine (Experel) 20mL mixed with 50 mL of bupivicaine 0.25% with epinephrine  Estimated Blood Loss (EBL):   Total I/O In: 1250 [I.V.:750; IV Piggyback:500] Out: - .   (See anesthesia record)  Delay start of Pharmacological VTE agent (>24hrs) due to concerns of significant anemia, surgical blood loss, or risk of bleeding?:  no  DRAINS: (None) and 19 Fr Blake drain with tip resting in the mediastinum  SPECIMEN:  Hernia sac (not sent)  DISPOSITION  OF SPECIMEN:  (not applicable)  COUNTS:  Sponge, needle, & instrument counts CORRECT  PLAN OF CARE: Admit for overnight observation  PATIENT DISPOSITION:  PACU - hemodynamically stable.  INDICATION:   Patient with symptomatic paraesophageal hiatal hernia.  50% incarcerated up to the chest with worsening dysphagia and other issues despite followed by gastroenterology and antiacid medication. .  The patient has had extensive work-up & we feel the patient will benefit from repair:  The anatomy & physiology of the foregut and anti-reflux mechanism was discussed.  The pathophysiology of hiatal herniation and GERD was discussed.  Natural history risks without surgery was discussed.   The patient's symptoms are not adequately controlled by medicines and other non-operative treatments.  I feel the risks of no intervention will lead to serious problems that outweigh the operative risks; therefore, I recommended surgery to reduce the hiatal hernia out of the chest and fundoplication to rebuild the anti-reflux valve and control reflux better.  Need for a thorough workup to rule out the differential diagnosis and plan treatment was explained.  I explained laparoscopic techniques with possible need for an open approach.  Risks such as bleeding, infection, abscess, leak, need for further treatment, heart attack, death, and other risks were discussed.   I noted a good likelihood this will help address the problem.  Goals of post-operative recovery were discussed as well.  Possibility that this will not correct all symptoms was explained.  Post-operative dysphagia, need for short-term liquid & pureed diet, inability to vomit, possibility of reherniation, possible need for medicines to help control symptoms in addition to surgery were discussed.  We will work to minimize complications.   Educational handouts further  explaining the pathology, treatment options, and dysphagia diet was given as well.  Questions were  answered.  The patient expresses understanding & wishes to proceed with surgery.  OR FINDINGS:   Moderate-sized paraesophageal hiatal hernia with 50% of the stomach in the mediastinum.  There was a 9 x 6 cm hiatal defect.  It is a primary repair over pledgets.  Mesh reinforcement was used with GORE BIO-A mesh, a biosynthetic web scaffold made of 67% polyglycolic acid (PGA): 33% trimethylene carbonate (TMC).  The patient has a Toupet (270 degree partial posterior x 4cm)  fundoplication.  The patient has had anterior and posterior gastropexy.  DESCRIPTION:   Informed consent was confirmed.  The patient received IV antibiotics prior to incision.  The underwent general anesthesia without difficulty.  The abdomen was prepped and draped in the sterile fashion.  Surgical time-out confirmed our plan.  I placed a 5 mm port in the left subcostal region using Varess entry technique with the patient in steep reverse Trendelenburg and left side up.  Entry was clean.  We induced carbon dioxide insufflation.  Camera inspection revealed no injury.  Under direct visualization, I placed extra ports.  I also placed a 5 mm port in the left subxiphoid region under direct visualization.  I removed that and placed an Omega-shaped rigid Nathanson liver retractor to lift the left lateral sector of the liver anteriorly to expose the esophageal hiatus.  This was secured to the bed using the iron man system.  The Xi robot was carefully docked and instruments placed and advanced under direct visualization.  We focused on dissection.  We grasped the anterior mediastinal sac at the apex of the crus.  I scored through that and got into the anterior mediastinum.  I was able to free the mediastinal sac from its attachments to the pericardium and bilateral pleura using primarily focused gentle blunt dissection as well as focused vessel sealer dissection.  I transected phrenoesophageal attachments to the inner right crus, preserving  a two centimeter cuff of mediastinal sac until I found the base of the crura.  I then came around anteriorly on the left side and freed up the phrenoesophageal attachments of the mediastinal sac on the medial part of the left crus on the superior half.  I did careful mediastinal dissection to free the mediastinal sac.  With that, we could relieve the suction cup affect of the hernia sac and help reduce the stomach back down into the abdomen, flipped back approriately.    We ligated the short gastrics along the lesser curvature of the stomach about a third the way down and then came up proximally over the fundus.  We released the attachments of the stomach to the retroperitoneum until we were able to connect with the prior dissection on the left crus.  We completed the release of phrenoesophageal attachments to the medial part of the left crus down to its base.  With this, we had circumferential mobilization.    We placed the stomach and esophagus on axial tension.  I then did a Type II mediastinal dissection where I freed the esophagus from its attachments to the aorta, spine, pleura, and pericardium using primarily gentle blunt as well as focused ultrasonic dissection.  We saw the anterior & posterior vagus nerves intact.  We preserved it at all times.  I procedded to dissect about 25 cm proximally into the mediastinum.  With that I could straighten out the esophagus and get 5 cm of intra-abdominal length  of the esophagus at a best estimation.  I freed the anterior mediastinal sac off the esophagus & stomach.  We saw the anterior vagus nerve and freed the sac off of the vagus.  I dissected out & removed the fatty  epiphrenic pads at the esophagogastric junction. With that, I could better define the esophagogastric junction.  I confirmed the the patient had 5 cm of intra-abdominal esophageal length off tension.  I did a crural release right anterior & left lateral to get better relaxation & crural apposition  without tension.  I brought the fundus of the stomach posterior to the esophagus over to the right side.  The wrap was mobile with the classic shoe shine maneuver.  Wrap became together gently.  We reflected the stomach left laterally and closed the esophageal hiatus using #1 Ethibond stitch using horizontal mattress stitches with pledgets on both sides.  I did that x2 stitches.  I then did a third #1 Ethibond horizontal mattress suture to close the hiatal hernia at the most superior junction without pledgets.  The crura had thin but good substance and they came together well without any tension.  Because of the larger defect, I reinforced the repair using a Bio-A 10x7 cm biosynthetic precut mesh. We brought the mesh in and laid it over the crural repair, tails anterior over the crura.  I tucked the broader "U" tail of the mesh between the left diaphragm and the spleen, the narrower "U" tail over the right crus.  I secured the mesh with the upper ethibond sutrue at the crotch of the "U".  I secured to the left lateral and left superior sides of the broader "U" tail to the left diaphragm band with 2-0 V lock running suture.  Secured the narrow towel to the right crus using a running 2-0 V-lock suture as well  I brought the fundus of the stomach behind the esophagus and cardia to set up a fundoplication wrap.  I did a posterior gastropexy x3  by taking of #1 Ethibond interrupted stitches to the posterior part of the right side of the wrap and thru the mesh and crural closure.  I placed a stitch on the inner right crus, superior part of the wrap, superior lateral esophagus and tied that down for a right anterior gastropexy.  I did a similar stitch on the left anterior side as well.  That way the stomach covered the mesh and protected it from any esophageal exposure.  With the anterior and posterior gastropexies, stomach laid well for a fundoplication wrap.  I then did a 4cm Toupet fundoplication on the true  esophagus above the cardia using 0 Ethibond stitch in the left superior side of the wrap, apex of the left inner crus then left anterior esophagus and tied that down to do a left anterior gastropexy.  Did a mirror-image stitch on the right side to do a right anterior gastropexy.  I then did 2 more distal pairs of suture between the inner part of the wrap and the anterolateral esophagus.  That way there were 3 total pairs of fundoplication sutures offering a posterior 270 degree wrap.  Measured at 4 cm.  Classic Toupet fundoplication.    The wrap was soft and floppy.  I placed a drain as noted above.  I did irrigation and ensured hemostasis.  I saw no evidence of any leak or perforation or other abnormality.  I removed the [facility] liver retractor under direct visualization.  I evacuated carbon dioxide  and removed the ports.  Drain secured with Prolene suture.  Patient had a small sensitive umbilical hernia that I repair primarily with #1 PDS interrupted sutures.  Umbilicus retacked.  The skin was closed with Monocryl and sterile dressings applied.  The patient is being extubated and brought back to the recovery room.  I discussed postop care in detail with the patient and family in in the office.  Discussed again with the patient & her significant other in the holding area.  I discussed operative findings, updated the patient's status, discussed probable steps to recovery, and gave postoperative recommendations to the patient's significant other, Molly Maduro English .  Recommendations were made.  Questions were answered.  He expressed understanding & appreciation.    Ardeth Sportsman, M.D., F.A.C.S. Gastrointestinal and Minimally Invasive Surgery [facility] [address], [address] [place], [place] [address] [phone] Main / Paging

## 2023-07-03 ENCOUNTER — Observation Stay (HOSPITAL_COMMUNITY): Payer: 59

## 2023-07-03 DIAGNOSIS — K440 Diaphragmatic hernia with obstruction, without gangrene: Secondary | ICD-10-CM | POA: Diagnosis not present

## 2023-07-03 DIAGNOSIS — K5904 Chronic idiopathic constipation: Secondary | ICD-10-CM | POA: Diagnosis not present

## 2023-07-03 DIAGNOSIS — K429 Umbilical hernia without obstruction or gangrene: Secondary | ICD-10-CM | POA: Diagnosis not present

## 2023-07-03 DIAGNOSIS — K2930 Chronic superficial gastritis without bleeding: Secondary | ICD-10-CM | POA: Diagnosis not present

## 2023-07-03 DIAGNOSIS — K219 Gastro-esophageal reflux disease without esophagitis: Secondary | ICD-10-CM | POA: Diagnosis not present

## 2023-07-03 DIAGNOSIS — K449 Diaphragmatic hernia without obstruction or gangrene: Secondary | ICD-10-CM | POA: Diagnosis not present

## 2023-07-03 DIAGNOSIS — E785 Hyperlipidemia, unspecified: Secondary | ICD-10-CM | POA: Diagnosis not present

## 2023-07-03 MED ORDER — IOHEXOL 300 MG/ML  SOLN
50.0000 mL | Freq: Once | INTRAMUSCULAR | Status: AC | PRN
  Administered 2023-07-03: 50 mL via ORAL

## 2023-07-03 NOTE — Care Management Obs Status (Signed)
MEDICARE OBSERVATION STATUS NOTIFICATION   Patient Details  Name: Shannon BRAGGS MRN: [id number] Date of Birth: 1965/06/09   Medicare Observation Status Notification Given:  Yes    Adrian Prows, RN 07/03/2023, 3:23 PM

## 2023-07-03 NOTE — Progress Notes (Signed)
Patient JP bulb has not been suctioning since the beginning of my shift, bulb was replaced but still no suctioning.

## 2023-07-03 NOTE — TOC Initial Note (Signed)
Transition of Care [facility]) - Initial/Assessment Note    Patient Details  Name: Shannon Castaneda MRN: [id number] Date of Birth: September 08, 1965  Transition of [facility]) CM/SW Contact:    Adrian Prows, RN Phone Number: 07/03/2023, 3:30 PM  Clinical Narrative:                 TOC for d/c planning; spoke w/ pt in room; pt says she lives at home w/ her S.O. Mariam Dollar [phone]); she plans to return at d/c; pt verified insurance/PCP; she denies SDOH risks; pt says she will try to arrange transportation home w/ family or [facility]; pt says she does not have DME, HH services, to home oxygen; TOC will follow.  Expected Discharge Plan: Home/Self Care Barriers to Discharge: Continued Medical Work up   Patient Goals and CMS Choice Patient states their goals for this hospitalization and ongoing recovery are:: home CMS Medicare.gov Compare Post Acute Care list provided to:: Patient        Expected Discharge Plan and Services   Discharge Planning Services: CM Consult   Living arrangements for the past 2 months: Single Family Home                                      Prior Living Arrangements/Services Living arrangements for the past 2 months: Single Family Home Lives with:: Significant Other Patient language and need for interpreter reviewed:: Yes Do you feel safe going back to the place where you live?: Yes      Need for Family Participation in Patient Care: Yes (Comment) Care giver support system in place?: Yes (comment) Current home services:  (n/a) Criminal Activity/Legal Involvement Pertinent to Current Situation/Hospitalization: No - Comment as needed  Activities of Daily Living   ADL Screening (condition at time of admission) Independently performs ADLs?: Yes (appropriate for developmental age) Is the patient deaf or have difficulty hearing?: No Does the patient have difficulty seeing, even when wearing glasses/contacts?: No Does the patient have difficulty  concentrating, remembering, or making decisions?: No  Permission Sought/Granted Permission sought to share information with : Case Manager Permission granted to share information with : Yes, Verbal Permission Granted  Share Information with NAME: Case Manager     Permission granted to share info w Relationship: Mariam Dollar (S.O.) [phone]     Emotional Assessment Appearance:: Appears stated age Attitude/Demeanor/Rapport: Gracious Affect (typically observed): Accepting Orientation: : Oriented to Self, Oriented to Place, Oriented to  Time, Oriented to Situation Alcohol / Substance Use: Not Applicable Psych Involvement: No (comment)  Admission diagnosis:  Incarcerated hiatal hernia [K44.0] Patient Active Problem List   Diagnosis Date Noted   Chronic superficial gastritis without bleeding 04/06/2023   History of adenomatous polyp of colon 04/06/2023   Kyphoscoliosis 04/06/2023   Nausea and vomiting 04/06/2023   Gastroesophageal reflux disease with esophagitis without hemorrhage 03/02/2023   Adenomatous polyp of sigmoid colon 03/02/2023   Incarcerated hiatal hernia 03/02/2023   Esophageal dysphagia 02/02/2023   Chronic idiopathic constipation 02/02/2023   Elevated liver enzymes 02/02/2023   Generalized abdominal pain 02/02/2023   Encounter for screening colonoscopy 02/02/2023   Left lower quadrant abdominal pain 01/26/2023   Right shoulder pain 09/03/2022   Restless legs 02/12/2021   Fatigue 06/19/2019   Gastroesophageal reflux disease 04/28/2019   Vitamin D deficiency 04/28/2019   Degeneration of lumbar or lumbosacral intervertebral disc 03/17/2016   Chronic back pain 03/17/2016  HLD (hyperlipidemia) 03/17/2016   PCP:  Tommie Sams, DO Pharmacy:   [facility] [phone] - [place], [place] - [address] AT [facility] OF [address] & [address] [place] [address] Phone: [phone] Fax: [phone]     Social Drivers of Health  (SDOH) Social History: SDOH Screenings   Food Insecurity: No Food Insecurity (07/03/2023)  Housing: Low Risk  (07/03/2023)  Transportation Needs: No Transportation Needs (07/03/2023)  Utilities: Not At Risk (07/03/2023)  Alcohol Screen: Low Risk  (05/15/2022)  Depression (PHQ2-9): Medium Risk (01/26/2023)  Financial Resource Strain: Low Risk  (05/15/2022)  Physical Activity: Inactive (05/15/2022)  Social Connections: Patient Declined (07/02/2023)  Stress: No Stress Concern Present (05/15/2022)  Tobacco Use: Low Risk  (07/02/2023)   SDOH Interventions: Food Insecurity Interventions: Intervention Not Indicated, Inpatient TOC Housing Interventions: Intervention Not Indicated, Inpatient TOC Transportation Interventions: Intervention Not Indicated, Inpatient TOC Utilities Interventions: Intervention Not Indicated, Inpatient TOC   Readmission Risk Interventions     No data to display

## 2023-07-03 NOTE — Plan of Care (Signed)
   Problem: Activity: Goal: Risk for activity intolerance will decrease Outcome: Progressing

## 2023-07-03 NOTE — Progress Notes (Signed)
1 Day Post-Op   Subjective/Chief Complaint: Sore Minimal nausea Drain - serous output Awaiting gastrografin swallow   Objective: Vital signs in last 24 hours: Temp:  [97.4 F (36.3 C)-98.4 F (36.9 C)] 97.4 F (36.3 C) (02/15 0617) Pulse Rate:  [65-114] 65 (02/15 0617) Resp:  [12-26] 18 (02/15 0617) BP: (95-154)/(53-88) 106/70 (02/15 0617) SpO2:  [95 %-100 %] 99 % (02/15 0617) Last BM Date : 06/29/23  Intake/Output from previous day: 02/14 0701 - 02/15 0700 In: 2638.2 [P.O.:900; I.V.:1238.2; IV Piggyback:500] Out: 3650 [Urine:3300; Drains:350] Intake/Output this shift: No intake/output data recorded.  Abd - soft, incisional tenderness Mediastinal drain - serous output Small amount of leak around the drain site Other incisions c/d/i   Anti-infectives: Anti-infectives (From admission, onward)    Start     Dose/Rate Route Frequency Ordered Stop   07/02/23 0600  cefTRIAXone (ROCEPHIN) 2 g in sodium chloride 0.9 % 100 mL IVPB        2 g 200 mL/hr over 30 Minutes Intravenous On call to O.R. 07/02/23 0533 07/03/23 0738       Assessment/Plan: s/p Procedure(s): ROBOTIC REPAIR OF PARAESOPHAGEAL HIATAL HERNIA WITH MESH, PRIMARY UMBILICAL HERNIA REPAIR (N/A)  Gastrografin swallow this morning.  If negative, advance to DYS -1 diet Continue drain for now until output decreased   LOS: 0 days    Wynona Luna 07/03/2023

## 2023-07-04 DIAGNOSIS — K219 Gastro-esophageal reflux disease without esophagitis: Secondary | ICD-10-CM | POA: Diagnosis not present

## 2023-07-04 DIAGNOSIS — K5904 Chronic idiopathic constipation: Secondary | ICD-10-CM | POA: Diagnosis not present

## 2023-07-04 DIAGNOSIS — E785 Hyperlipidemia, unspecified: Secondary | ICD-10-CM | POA: Diagnosis not present

## 2023-07-04 DIAGNOSIS — K2930 Chronic superficial gastritis without bleeding: Secondary | ICD-10-CM | POA: Diagnosis not present

## 2023-07-04 DIAGNOSIS — K440 Diaphragmatic hernia with obstruction, without gangrene: Secondary | ICD-10-CM | POA: Diagnosis not present

## 2023-07-04 DIAGNOSIS — K429 Umbilical hernia without obstruction or gangrene: Secondary | ICD-10-CM | POA: Diagnosis not present

## 2023-07-04 NOTE — Plan of Care (Signed)
  Problem: Education: Goal: Knowledge of General Education information will improve Description: Including pain rating scale, medication(s)/side effects and non-pharmacologic comfort measures Outcome: Progressing   Problem: Clinical Measurements: Goal: Ability to maintain clinical measurements within normal limits will improve Outcome: Progressing Goal: Respiratory complications will improve Outcome: Progressing   Problem: Activity: Goal: Risk for activity intolerance will decrease Outcome: Progressing   

## 2023-07-04 NOTE — Progress Notes (Signed)
Patient JP drain came out and site was covered with gauze.

## 2023-07-04 NOTE — Plan of Care (Signed)
   Problem: Education: Goal: Knowledge of General Education information will improve Description Including pain rating scale, medication(s)/side effects and non-pharmacologic comfort measures Outcome: Progressing   Problem: Health Behavior/Discharge Planning: Goal: Ability to manage health-related needs will improve Outcome: Progressing

## 2023-07-04 NOTE — Progress Notes (Signed)
Most of the suture was removed from where JP drain was, I was unable to remove to small part. Another RN attempted too but was unsuccessful.

## 2023-07-04 NOTE — Progress Notes (Signed)
2 Days Post-Op   Subjective/Chief Complaint: The mediastinal drain was inadvertently removed this morning. The suture remains in place. Patient feels weak and tired Some difficulty with swallowing the pureed diet. UGI - tight GE junction   Objective: Vital signs in last 24 hours: Temp:  [97.8 F (36.6 C)-97.9 F (36.6 C)] 97.9 F (36.6 C) (02/16 0557) Pulse Rate:  [60-76] 76 (02/16 0557) Resp:  [18] 18 (02/16 0557) BP: (108-122)/(67-69) 122/69 (02/16 0557) SpO2:  [98 %-100 %] 100 % (02/16 0557) Last BM Date : 06/29/23  Intake/Output from previous day: 02/15 0701 - 02/16 0700 In: 1020 [P.O.:1020] Out: 1885 [Urine:1850; Drains:35] Intake/Output this shift: Total I/O In: 240 [P.O.:240] Out: 500 [Urine:500]  Abd - soft, incisional tenderness Suture removed from drain site Other incisions c/d/i   Studies/Results: DG ESOPHAGUS W SINGLE CM (SOL OR THIN BA) Result Date: 07/03/2023 CLINICAL DATA:  Incarcerated paraesophageal hiatal hernia. Status post robotic reduction of paraesophageal hiatal hernia done 07/02/2023. EXAM: ESOPHAGUS/BARIUM SWALLOW/TABLET STUDY TECHNIQUE: Single contrast examination was performed using water-soluble contrast. This exam was performed by Corrin Parker, PA-C, and was supervised and interpreted by Dr. Herbie Baltimore. FLUOROSCOPY: Radiation Exposure Index (as provided by the fluoroscopic device): 8.2 mGy Kerma COMPARISON:  CT scan of the abdomen pelvis done January 26 2023 FINDINGS: Esophagus: Significant narrowing at the gastroesophageal junction consistent with recent paraesophageal hernia repair. We were only able to distend the GE junction to about 0.6 cm. Much of this likely represents postsurgical swelling. Esophageal motility: Stasis of contrast in the mid to distal esophagus due to narrowing at the GE junction. Contrast remained in this area for proximally 6 minutes and finally cleared with additional sips of water. Primary peristaltic wave in the  esophagus diminished although this may be due to the distal narrowing rather than dysmotility. Hiatal Hernia: None. Ingested 13mm barium tablet: Tablet not given due to narrowing at the GE junction. Other: Surgical drain noted extending along the postoperative region and projecting over the left lung base. IMPRESSION: Status post paraesophageal hernia repair. No leak. Slow drainage through narrowed GE junction, maximum diameter 6 mm, likely related to postoperative swelling in this vicinity. Electronically Signed   By: Gaylyn Rong M.D.   On: 07/03/2023 10:42    Anti-infectives: Anti-infectives (From admission, onward)    Start     Dose/Rate Route Frequency Ordered Stop   07/02/23 0600  cefTRIAXone (ROCEPHIN) 2 g in sodium chloride 0.9 % 100 mL IVPB        2 g 200 mL/hr over 30 Minutes Intravenous On call to O.R. 07/02/23 0533 07/03/23 0738       Assessment/Plan: s/p Procedure(s): ROBOTIC REPAIR OF PARAESOPHAGEAL HIATAL HERNIA WITH MESH, PRIMARY UMBILICAL HERNIA REPAIR (N/A) Patient having some swallowing difficulties with pureed diet - will back off to full liquids GE junction may be tight from post-operative edema  LOS: 0 days    Wynona Luna 07/04/2023

## 2023-07-04 NOTE — Progress Notes (Signed)
MD Tsuei was notified that patient's JP drain cam out.

## 2023-07-04 NOTE — Anesthesia Postprocedure Evaluation (Signed)
Anesthesia Post Note  Patient: Shannon Castaneda  Procedure(s) Performed: ROBOTIC REPAIR OF PARAESOPHAGEAL HIATAL HERNIA WITH MESH, PRIMARY UMBILICAL HERNIA REPAIR     Patient location during evaluation: PACU Anesthesia Type: General Level of consciousness: awake and alert Pain management: pain level controlled Vital Signs Assessment: post-procedure vital signs reviewed and stable Respiratory status: spontaneous breathing, nonlabored ventilation and respiratory function stable Cardiovascular status: blood pressure returned to baseline and stable Postop Assessment: no apparent nausea or vomiting Anesthetic complications: no   No notable events documented.                  Kimyatta Lecy

## 2023-07-05 ENCOUNTER — Encounter (HOSPITAL_COMMUNITY): Payer: Self-pay | Admitting: Surgery

## 2023-07-05 DIAGNOSIS — K5904 Chronic idiopathic constipation: Secondary | ICD-10-CM | POA: Diagnosis not present

## 2023-07-05 DIAGNOSIS — K429 Umbilical hernia without obstruction or gangrene: Secondary | ICD-10-CM | POA: Diagnosis not present

## 2023-07-05 DIAGNOSIS — K219 Gastro-esophageal reflux disease without esophagitis: Secondary | ICD-10-CM | POA: Diagnosis not present

## 2023-07-05 DIAGNOSIS — K440 Diaphragmatic hernia with obstruction, without gangrene: Secondary | ICD-10-CM | POA: Diagnosis not present

## 2023-07-05 DIAGNOSIS — E785 Hyperlipidemia, unspecified: Secondary | ICD-10-CM | POA: Diagnosis not present

## 2023-07-05 DIAGNOSIS — K2930 Chronic superficial gastritis without bleeding: Secondary | ICD-10-CM | POA: Diagnosis not present

## 2023-07-05 MED ORDER — FUROSEMIDE 10 MG/ML IJ SOLN: 40.0000 mg | Freq: Once | INTRAMUSCULAR | Status: DC

## 2023-07-05 NOTE — Progress Notes (Signed)
 Discharge instructions discussed with patient and family, verbalized agreement and understanding

## 2023-07-05 NOTE — Plan of Care (Signed)
   Problem: Education: Goal: Knowledge of General Education information will improve Description Including pain rating scale, medication(s)/side effects and non-pharmacologic comfort measures Outcome: Progressing

## 2023-07-05 NOTE — Discharge Summary (Signed)
Physician Discharge Summary    Shannon Castaneda MRN: [id number] DOB/AGE: [birth year] = 57 y.o.  Patient Care Team: Tommie Sams, DO as PCP - General (Family Medicine) Michaelle Copas, MD as Referring Physician (Optometry) Oliver Barre, MD as Consulting Physician (Orthopedic Surgery) Karie Soda, MD as Consulting Physician (General Surgery) Franky Macho, MD as Consulting Physician (Gastroenterology)  Admit date: 07/02/2023  Discharge date: 07/05/2023  Hospital Stay = 0 days    Discharge Diagnoses:  Principal Problem:   Incarcerated hiatal hernia Active Problems:   Chronic back pain   HLD (hyperlipidemia)   Gastroesophageal reflux disease   Fatigue   Chronic idiopathic constipation   Chronic superficial 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 ROBOTIC reduction of paraesophageal hiatal hernia 2. Type II mediastinal dissection. 3. Primary repair of hiatal hernia over pledgets.  4. Anterior & posterior gastropexy. 5. Toupet (270 degree partial posterior x 4cm)  fundoplication 6. Mesh reinforcement with absorbable mesh 7.  Primary umbilical hernia repair   SURGEON:  Ardeth Sportsman, MD  OR FINDINGS:    Moderate-sized paraesophageal hiatal hernia with 50% of the stomach in the mediastinum.  There was a 9 x 6 cm hiatal defect.   It is a primary repair over pledgets.  Mesh reinforcement was used with GORE BIO-A mesh, a biosynthetic web scaffold made of 67% polyglycolic acid (PGA): 33% trimethylene carbonate (TMC).   The patient has a Toupet (270 degree partial posterior x 4cm)  fundoplication.  The patient has had anterior and posterior gastropexy.  Consults: Case Management / Social Work and Anesthesia  Hospital Course:   Patient with incarcerated hiatal hernia.  The patient underwent  the surgery above.   Esophagram postop day 1 showed a slightly tight wrap but no leak or obstruction.  Postoperatively, the patient gradually mobilized and advanced to a full liquid/pured diet diet.  Pain and other symptoms were treated aggressively.    By the time of discharge, the patient was walking well the hallways, eating full liquid/pured diet food, having flatus.  Pain was well-controlled on an oral medications.  Drain came out yesterday.  Based on meeting discharge criteria and continuing to recover, I felt it was safe for the patient to be discharged from the hospital to further recover with close followup. Postoperative recommendations were discussed in detail with the patient, her significant other, and her nurse at the bedside in detail.  Questions answered.  Instructions are written as well.  Discharged Condition: good  Discharge Exam: Blood pressure 127/72, pulse 63, temperature 97.6 F (36.4 C), temperature source Oral, resp. rate 16, height 5\' 7"  (1.702 m), weight 55.8 kg, last menstrual period 10/27/2010, SpO2 99%.  General: Pt awake/alert/oriented x4 in No acute distress Eyes: PERRL, normal EOM.  Sclera clear.  No icterus Neuro: CN II-XII intact w/o focal sensory/motor deficits. Lymph: No head/neck/groin lymphadenopathy Psych:  No delerium/psychosis/paranoia HENT: Normocephalic, Mucus membranes moist.  No thrush Neck: Supple, No tracheal deviation Chest:  No chest wall pain w good excursion CV:  Pulses intact.  Regular rhythm MS: Normal AROM mjr joints.  No obvious deformity Abdomen: Soft.  Nondistended.  Nontender.  No evidence of peritonitis.  No incarcerated hernias. Ext:  SCDs BLE.  No mjr edema.  No cyanosis Skin: No petechiae / purpura   Disposition:  Follow-up Information     Karie Soda, MD Follow up on 07/27/2023.   Specialties: General Surgery, Colon and Rectal Surgery Why: To follow up after your operation Contact information: [address] [place]  [address] [phone]                 Discharge disposition: 01-Home or Self Care       Discharge Instructions     Call MD for:   Complete by: As directed    Temperature > 101.31F   Call MD for:  extreme fatigue   Complete by: As directed    Call MD for:  hives   Complete by: As directed    Call MD for:  persistant nausea and vomiting   Complete by: As directed    Call MD for:  redness, tenderness, or signs of infection (pain, swelling, redness, odor or green/yellow discharge around incision site)   Complete by: As directed    Call MD for:  severe uncontrolled pain   Complete by: As directed    Diet general   Complete by: As directed    SEE ESOPHAGEAL SURGERY DIET INSTRUCTIONS  We using usually start you out on a pureed (blenderized) diet. Expect some sticking with swallowing over the next 1-2 months.   This is due to swelling around your esophagus at the wrap & hiatal diaphragm repair.  It will gradually ease off over the next few months.   Discharge instructions   Complete by: As directed    Please see discharge instruction sheets.   Also refer to any handouts/printouts that may have been given from the [facility] surgery office (if you visited [place] there before surgery) Please call our office if you have any questions or concerns [phone]   Driving Restrictions   Complete by: As directed    No driving until off narcotics and can safely swerve away without pain during an emergency   Increase activity slowly   Complete by: As directed    Lifting restrictions   Complete by: As directed    Avoid heavy lifting initially, <20 pounds at first.   Do not push through pain.   You have no specific weight limit: If it hurts to do, DON'T DO IT.    If you feel no pain, you are not injuring anything.  Pain will protect you from injury.   Coughing and sneezing are far more stressful to your incision than any lifting.   Avoid resuming heavy lifting (>50 pounds) or other intense  activity until off all narcotic pain medications.   When want to exercise more, give yourself 2 weeks to gradually get back to full intense exercise/activity.   May shower / Bathe   Complete by: As directed    SHOWER EVERY DAY.  It is fine for dressings or wounds to be washed/rinsed.  Use gentle soap & water.  This will help the incisions and/or wounds get clean & minimize infection.   May walk up steps   Complete by: As directed    Remove dressing in 48 hours   Complete by: As directed    Sexual Activity Restrictions   Complete by: As directed    Sexual activity as tolerated.  Do not push through pain.  Pain will protect you from injury.   Walk with assistance   Complete by: As directed    Walk over an hour a day.  May use a walker/cane/companion to help with balance and stamina.  Allergies as of 07/05/2023   No Known Allergies      Medication List     TAKE these medications    acetaminophen 500 MG tablet Commonly known as: TYLENOL Take 1,000 mg by mouth every 6 (six) hours as needed (pain.).   alendronate 70 MG tablet Commonly known as: FOSAMAX Take 1 tablet (70 mg total) by mouth every 7 (seven) days. Take with a full glass of water on an empty stomach. What changed: when to take this   multivitamin with minerals Tabs tablet Take 2 tablets by mouth in the morning. One A Day + Centrum Multivitamin (takes together)   ondansetron 4 MG tablet Commonly known as: ZOFRAN Take 1 tablet (4 mg total) by mouth every 8 (eight) hours as needed for nausea.   oxyCODONE 5 MG immediate release tablet Commonly known as: Oxy IR/ROXICODONE Take 1 tablet (5 mg total) by mouth every 6 (six) hours as needed for moderate pain (pain score 4-6), severe pain (pain score 7-10) or breakthrough pain. Can increase to 2-3 pills at a time if needed   pantoprazole 40 MG tablet Commonly known as: PROTONIX Take 1 tablet (40 mg total) by mouth 2 (two) times daily before a meal. What changed:  when to take this   rosuvastatin 10 MG tablet Commonly known as: CRESTOR TAKE 1 TABLET(10 MG) BY MOUTH DAILY FOR CHOLESTEROL   tiZANidine 4 MG tablet Commonly known as: Zanaflex Take one tab po qhs prn muscle spasms        Significant Diagnostic Studies:  No results found for this or any previous visit (from the past 72 hours).  DG ESOPHAGUS W SINGLE CM (SOL OR THIN BA) Result Date: 07/03/2023 CLINICAL DATA:  Incarcerated paraesophageal hiatal hernia. Status post robotic reduction of paraesophageal hiatal hernia done 07/02/2023. EXAM: ESOPHAGUS/BARIUM SWALLOW/TABLET STUDY TECHNIQUE: Single contrast examination was performed using water-soluble contrast. This exam was performed by Corrin Parker, PA-C, and was supervised and interpreted by Dr. Herbie Baltimore. FLUOROSCOPY: Radiation Exposure Index (as provided by the fluoroscopic device): 8.2 mGy Kerma COMPARISON:  CT scan of the abdomen pelvis done January 26 2023 FINDINGS: Esophagus: Significant narrowing at the gastroesophageal junction consistent with recent paraesophageal hernia repair. We were only able to distend the GE junction to about 0.6 cm. Much of this likely represents postsurgical swelling. Esophageal motility: Stasis of contrast in the mid to distal esophagus due to narrowing at the GE junction. Contrast remained in this area for proximally 6 minutes and finally cleared with additional sips of water. Primary peristaltic wave in the esophagus diminished although this may be due to the distal narrowing rather than dysmotility. Hiatal Hernia: None. Ingested 13mm barium tablet: Tablet not given due to narrowing at the GE junction. Other: Surgical drain noted extending along the postoperative region and projecting over the left lung base. IMPRESSION: Status post paraesophageal hernia repair. No leak. Slow drainage through narrowed GE junction, maximum diameter 6 mm, likely related to postoperative swelling in this vicinity. Electronically  Signed   By: Gaylyn Rong M.D.   On: 07/03/2023 10:42    Past Medical History:  Diagnosis Date   Degeneration of lumbar or lumbosacral intervertebral disc 03/17/2016   Encounter for screening colonoscopy 02/02/2023   GERD (gastroesophageal reflux disease)    History of hiatal hernia    Hyperlipidemia    Nausea and vomiting 04/06/2023   Osteoporosis    Vitamin D deficiency     Past Surgical History:  Procedure Laterality Date   BIOPSY  03/02/2023   Procedure: BIOPSY;  Surgeon: Franky Macho, MD;  [facility]: [facility];  Service: Endoscopy;;   COLONOSCOPY WITH PROPOFOL N/A 03/02/2023   Procedure: COLONOSCOPY WITH PROPOFOL;  Surgeon: Franky Macho, MD;  Location: [facility];  Service: Endoscopy;  Laterality: N/A;  7:30am;asa 2   ESOPHAGOGASTRODUODENOSCOPY (EGD) WITH PROPOFOL N/A 03/02/2023   Procedure: ESOPHAGOGASTRODUODENOSCOPY (EGD) WITH PROPOFOL;  Surgeon: Franky Macho, MD;  Location: [facility];  Service: Endoscopy;  Laterality: N/A;  7:30am;asa 2   MULTIPLE EXTRACTIONS WITH ALVEOLOPLASTY  07/27/2011   Procedure: MULTIPLE EXTRACION WITH ALVEOLOPLASTY;  Surgeon: Georgia Lopes, DDS;  Location: [facility];  Service: Oral Surgery;  Laterality: Bilateral;  Insertion of denture.   POLYPECTOMY  03/02/2023   Procedure: POLYPECTOMY;  Surgeon: Franky Macho, MD;  [facility]: [facility];  Service: Endoscopy;;   SUBMUCOSAL LIFTING INJECTION  03/02/2023   Procedure: SUBMUCOSAL LIFTING INJECTION;  Surgeon: Franky Macho, MD;  [facility]: [facility];  Service: Endoscopy;;    Social History   Socioeconomic History   Marital status: Single    Spouse name: Not on file   Number of children: 1   Years of education: 12   Highest education level: Not on file  Occupational History    Comment: disability  Tobacco Use   Smoking status: Never    Passive exposure: Never   Smokeless tobacco: Never  Vaping Use   Vaping status: Never Used  Substance and Sexual  Activity   Alcohol use: No   Drug use: No   Sexual activity: Yes    Birth control/protection: Pill  Other Topics Concern   Not on file  Social History Narrative   Lives with daughter age 18, granddaughter   Disabled due to "slow learner"      Lives alone    Social Drivers of Health   Financial Resource Strain: Low Risk  (05/15/2022)   Overall Financial Resource Strain (CARDIA)    Difficulty of Paying Living Expenses: Not hard at all  Food Insecurity: No Food Insecurity (07/03/2023)   Hunger Vital Sign    Worried About Running Out of Food in the Last Year: Never true    Ran Out of Food in the Last Year: Never true  Transportation Needs: No Transportation Needs (07/03/2023)   PRAPARE - Administrator, Civil Service (Medical): No    Lack of Transportation (Non-Medical): No  Physical Activity: Inactive (05/15/2022)   Exercise Vital Sign    Days of Exercise per Week: 0 days    Minutes of Exercise per Session: 0 min  Stress: No Stress Concern Present (05/15/2022)   [employer] of Occupational Health - Occupational Stress Questionnaire    Feeling of Stress : Not at all  Social Connections: Patient Declined (07/02/2023)   Social Connection and Isolation Panel [NHANES]    Frequency of Communication with Friends and Family: Patient declined    Frequency of Social Gatherings with Friends and Family: Patient declined    Attends Religious Services: Patient declined    Database administrator or Organizations: Patient declined    Attends Banker Meetings: Patient declined    Marital Status: Patient declined  Intimate Partner Violence: Not At Risk (07/03/2023)   Humiliation, Afraid, Rape, and Kick questionnaire    Fear of Current or Ex-Partner: No    Emotionally Abused: No    Physically Abused: No    Sexually Abused: No    Family History  Problem Relation  Age of Onset   Heart disease Mother    Cancer Brother        lung    Current  Facility-Administered Medications  Medication Dose Route Frequency Provider Last Rate Last Admin   0.9 %  sodium chloride infusion  250 mL Intravenous PRN Karie Soda, MD       acetaminophen (TYLENOL) tablet 1,000 mg  1,000 mg Oral Trecia Rogers, MD   1,000 mg at 07/05/23 0513   alum & mag hydroxide-simeth (MAALOX/MYLANTA) 200-200-20 MG/5ML suspension 30 mL  30 mL Oral Q6H PRN Karie Soda, MD   30 mL at 07/03/23 0006   bisacodyl (DULCOLAX) suppository 10 mg  10 mg Rectal Daily PRN Karie Soda, MD       dexamethasone (DECADRON) injection 8 mg  8 mg Intravenous Catha Gosselin, MD   8 mg at 07/04/23 2128   diphenhydrAMINE (BENADRYL) 12.5 MG/5ML elixir 12.5 mg  12.5 mg Oral Q6H PRN Karie Soda, MD       Or   diphenhydrAMINE (BENADRYL) injection 12.5 mg  12.5 mg Intravenous Q6H PRN Karie Soda, MD       enoxaparin (LOVENOX) injection 40 mg  40 mg Subcutaneous Q24H Karie Soda, MD   40 mg at 07/05/23 1610   gabapentin (NEURONTIN) capsule 300 mg  300 mg Oral BID Karie Soda, MD   300 mg at 07/05/23 9604   HYDROmorphone (DILAUDID) injection 0.5-1 mg  0.5-1 mg Intravenous Q3H PRN Karie Soda, MD       magic mouthwash  15 mL Oral QID PRN Karie Soda, MD       magnesium hydroxide (MILK OF MAGNESIA) suspension 30 mL  30 mL Oral Daily PRN Karie Soda, MD       menthol-cetylpyridinium (CEPACOL) lozenge 3 mg  1 lozenge Oral PRN Karie Soda, MD       metoCLOPramide (REGLAN) injection 5-10 mg  5-10 mg Intravenous Q8H PRN Karie Soda, MD       metoprolol tartrate (LOPRESSOR) injection 5 mg  5 mg Intravenous Q6H PRN Karie Soda, MD       multivitamin with minerals tablet 1 tablet  1 tablet Oral q AM Karie Soda, MD   1 tablet at 07/05/23 0803   naphazoline-glycerin (CLEAR EYES REDNESS) ophth solution 1-2 drop  1-2 drop Both Eyes QID PRN Karie Soda, MD       ondansetron (ZOFRAN-ODT) disintegrating tablet 4 mg  4 mg Oral Q6H PRN Karie Soda, MD       Or   ondansetron  [facility]) injection 4 mg  4 mg Intravenous Q6H PRN Karie Soda, MD       oxyCODONE (Oxy IR/ROXICODONE) immediate release tablet 5-10 mg  5-10 mg Oral Q4H PRN Karie Soda, MD   10 mg at 07/04/23 1840   pantoprazole (PROTONIX) EC tablet 40 mg  40 mg Oral BID Edwena Felty, MD   40 mg at 07/05/23 0803   phenol (CHLORASEPTIC) mouth spray 2 spray  2 spray Mouth/Throat PRN Karie Soda, MD       polycarbophil (FIBERCON) tablet 625 mg  625 mg Oral BID Karie Soda, MD   625 mg at 07/05/23 5409   prochlorperazine (COMPAZINE) tablet 10 mg  10 mg Oral Q6H PRN Karie Soda, MD       Or   prochlorperazine (COMPAZINE) injection 5-10 mg  5-10 mg Intravenous Q6H PRN Karie Soda, MD       rosuvastatin (CRESTOR) tablet 10 mg  10 mg Oral Daily Madelyne Millikan,  Viviann Spare, MD   10 mg at 07/05/23 0804   simethicone (MYLICON) chewable tablet 80 mg  80 mg Oral QID Karie Soda, MD   80 mg at 07/05/23 0804   sodium chloride (OCEAN) 0.65 % nasal spray 1-2 spray  1-2 spray Each Nare Q6H PRN Karie Soda, MD       sodium chloride flush (NS) 0.9 % injection 3 mL  3 mL Intravenous Catha Gosselin, MD   3 mL at 07/04/23 2130   sodium chloride flush (NS) 0.9 % injection 3 mL  3 mL Intravenous PRN Karie Soda, MD       tiZANidine (ZANAFLEX) tablet 4 mg  4 mg Oral Q6H PRN Karie Soda, MD   4 mg at 07/03/23 0000   traMADol (ULTRAM) tablet 50-100 mg  50-100 mg Oral Q6H PRN Karie Soda, MD   100 mg at 07/04/23 1945     No Known Allergies  Signed:   Ardeth Sportsman, MD, FACS, MASCRS Esophageal, Gastrointestinal & Colorectal Surgery Robotic and Minimally Invasive Surgery  [facility] [address], [address] [place], [place] [address] [phone] Fax [phone] Main  CONTACT INFORMATION: Weekday (9AM-5PM): Call [facility] main office at [phone] Weeknight (5PM-9AM) or Weekend/Holiday: Check EPIC "Web Links" tab & use "AMION" (password " [email or web address]") for General  Surgery [facility] coverage  Please, DO NOT use SecureChat  (it is not reliable communication to reach operating surgeons & will lead to a delay in care).   Epic staff messaging available for outptient concerns needing 1-2 business day response.      07/05/2023, 8:18 AM

## 2023-07-16 ENCOUNTER — Ambulatory Visit (INDEPENDENT_AMBULATORY_CARE_PROVIDER_SITE_OTHER): Payer: 59

## 2023-07-16 VITALS — Ht 67.0 in | Wt 124.0 lb

## 2023-07-16 DIAGNOSIS — Z0000 Encounter for general adult medical examination without abnormal findings: Secondary | ICD-10-CM | POA: Diagnosis not present

## 2023-07-16 NOTE — Progress Notes (Signed)
 Because this visit was a virtual/telehealth visit,  certain criteria was not obtained, such a blood pressure, CBG if applicable, and timed get up and go. Any medications not marked as "taking" were not mentioned during the medication reconciliation part of the visit. Any vitals not documented were not able to be obtained due to this being a telehealth visit or patient was unable to self-report a recent blood pressure reading due to a lack of equipment at home via telehealth. Vitals that have been documented are verbally provided by the patient.   Subjective:   Shannon Castaneda is a 58 y.o. who presents for a Medicare Wellness preventive visit.  Visit Complete: Virtual I connected with  Shannon Castaneda on 07/16/23 by a audio enabled telemedicine application and verified that I am speaking with the correct person using two identifiers.  Patient Location: Home  Provider Location: Home Office  I discussed the limitations of evaluation and management by telemedicine. The patient expressed understanding and agreed to proceed.  Vital Signs: Because this visit was a virtual/telehealth visit, some criteria may be missing or patient reported. Any vitals not documented were not able to be obtained and vitals that have been documented are patient reported.  VideoDeclined- This patient declined Librarian, academic. Therefore the visit was completed with audio only.  AWV Questionnaire: No: Patient Medicare AWV questionnaire was not completed prior to this visit.  Cardiac Risk Factors include: advanced age (>80men, >93 women);dyslipidemia     Objective:    Today's Vitals   07/16/23 0846 07/16/23 0847  Weight: 124 lb (56.2 kg)   Height: 5\' 7"  (1.702 m)   PainSc:  9    Body mass index is 19.42 kg/m.     07/16/2023    8:57 AM 07/02/2023    5:51 AM 06/29/2023    8:17 AM 03/02/2023    7:44 AM 05/15/2022    2:41 PM 05/06/2021    1:53 PM 07/23/2011   11:49 AM  Advanced  Directives  Does Patient Have a Medical Advance Directive? No Yes Yes No No No Patient would not like information;Patient does not have advance directive  Type of Advance Directive  Living will;Healthcare Power of [employer] Power of [place];Living will      Does patient want to make changes to medical advance directive?  No - Patient declined No - Patient declined      Copy of Healthcare Power of Attorney in Chart?  No - copy requested       Would patient like information on creating a medical advance directive? No - Patient declined   No - Patient declined No - Patient declined No - Patient declined     Current Medications (verified) Outpatient Encounter Medications as of 07/16/2023  Medication Sig   acetaminophen (TYLENOL) 500 MG tablet Take 1,000 mg by mouth every 6 (six) hours as needed (pain.).   alendronate (FOSAMAX) 70 MG tablet Take 1 tablet (70 mg total) by mouth every 7 (seven) days. Take with a full glass of water on an empty stomach. (Patient taking differently: Take 70 mg by mouth every Friday. Take with a full glass of water on an empty stomach.)   Multiple Vitamin (MULTIVITAMIN WITH MINERALS) TABS tablet Take 2 tablets by mouth in the morning. One A Day + Centrum Multivitamin (takes together)   ondansetron (ZOFRAN) 4 MG tablet Take 1 tablet (4 mg total) by mouth every 8 (eight) hours as needed for nausea.   oxyCODONE (OXY IR/ROXICODONE) 5  MG immediate release tablet Take 1 tablet (5 mg total) by mouth every 6 (six) hours as needed for moderate pain (pain score 4-6), severe pain (pain score 7-10) or breakthrough pain. Can increase to 2-3 pills at a time if needed   pantoprazole (PROTONIX) 40 MG tablet Take 1 tablet (40 mg total) by mouth 2 (two) times daily before a meal. (Patient taking differently: Take 40 mg by mouth every evening.)   rosuvastatin (CRESTOR) 10 MG tablet TAKE 1 TABLET(10 MG) BY MOUTH DAILY FOR CHOLESTEROL   tiZANidine (ZANAFLEX) 4 MG tablet Take one tab po  qhs prn muscle spasms   No facility-administered encounter medications on file as of 07/16/2023.    Allergies (verified) Patient has no known allergies.   History: Past Medical History:  Diagnosis Date   Degeneration of lumbar or lumbosacral intervertebral disc 03/17/2016   Encounter for screening colonoscopy 02/02/2023   GERD (gastroesophageal reflux disease)    History of hiatal hernia    Hyperlipidemia    Nausea and vomiting 04/06/2023   Osteoporosis    Vitamin D deficiency    Past Surgical History:  Procedure Laterality Date   BIOPSY  03/02/2023   Procedure: BIOPSY;  Surgeon: Franky Macho, MD;  Location: [facility];  Service: Endoscopy;;   COLONOSCOPY WITH PROPOFOL N/A 03/02/2023   Procedure: COLONOSCOPY WITH PROPOFOL;  Surgeon: Franky Macho, MD;  Location: [facility];  Service: Endoscopy;  Laterality: N/A;  7:30am;asa 2   ESOPHAGOGASTRODUODENOSCOPY (EGD) WITH PROPOFOL N/A 03/02/2023   Procedure: ESOPHAGOGASTRODUODENOSCOPY (EGD) WITH PROPOFOL;  Surgeon: Franky Macho, MD;  Location: [facility];  Service: Endoscopy;  Laterality: N/A;  7:30am;asa 2   MULTIPLE EXTRACTIONS WITH ALVEOLOPLASTY  07/27/2011   Procedure: MULTIPLE EXTRACION WITH ALVEOLOPLASTY;  Surgeon: Georgia Lopes, DDS;  Location: [facility];  Service: Oral Surgery;  Laterality: Bilateral;  Insertion of denture.   POLYPECTOMY  03/02/2023   Procedure: POLYPECTOMY;  Surgeon: Franky Macho, MD;  Location: [facility];  Service: Endoscopy;;   SUBMUCOSAL LIFTING INJECTION  03/02/2023   Procedure: SUBMUCOSAL LIFTING INJECTION;  Surgeon: Franky Macho, MD;  Location: [facility];  Service: Endoscopy;;   XI ROBOTIC ASSISTED PARAESOPHAGEAL HERNIA REPAIR N/A 07/02/2023   Procedure: ROBOTIC REPAIR OF PARAESOPHAGEAL HIATAL HERNIA WITH MESH, PRIMARY UMBILICAL HERNIA REPAIR;  Surgeon: Karie Soda, MD;  Location: [facility];  Service: General;  Laterality: N/A;   Family History  Problem Relation Age of  Onset   Heart disease Mother    Cancer Brother        lung   Social History   Socioeconomic History   Marital status: Single    Spouse name: Not on file   Number of children: 1   Years of education: 12   Highest education level: Not on file  Occupational History    Comment: disability  Tobacco Use   Smoking status: Never    Passive exposure: Never   Smokeless tobacco: Never  Vaping Use   Vaping status: Never Used  Substance and Sexual Activity   Alcohol use: No   Drug use: No   Sexual activity: Yes    Birth control/protection: Pill  Other Topics Concern   Not on file  Social History Narrative   Lives with daughter age 58, granddaughter   Disabled due to "slow learner"      Lives alone    Social Drivers of Health   Financial Resource Strain: Low Risk  (07/16/2023)   Overall Financial  Resource Strain (CARDIA)    Difficulty of Paying Living Expenses: Not hard at all  Food Insecurity: No Food Insecurity (07/16/2023)   Hunger Vital Sign    Worried About Running Out of Food in the Last Year: Never true    Ran Out of Food in the Last Year: Never true  Transportation Needs: No Transportation Needs (07/16/2023)   PRAPARE - Administrator, Civil Service (Medical): No    Lack of Transportation (Non-Medical): No  Physical Activity: Insufficiently Active (07/16/2023)   Exercise Vital Sign    Days of Exercise per Week: 7 days    Minutes of Exercise per Session: 20 min  Stress: No Stress Concern Present (07/16/2023)   [employer] of Occupational Health - Occupational Stress Questionnaire    Feeling of Stress : Only a little  Social Connections: Unknown (07/16/2023)   Social Connection and Isolation Panel [NHANES]    Frequency of Communication with Friends and Family: More than three times a week    Frequency of Social Gatherings with Friends and Family: Once a week    Attends Religious Services: Never    Database administrator or Organizations: No    Attends  Engineer, structural: Never    Marital Status: Patient declined    Tobacco Counseling Counseling given: Yes    Clinical Intake:  Pre-visit preparation completed: Yes  Pain : 0-10 Pain Score: 9  Pain Type: Other (Comment) (s/p hernia repair Jul 02, 2023.Marland KitchenAW) Pain Location: Abdomen (s/p hernia repair 07/02/23) Pain Orientation: Mid Pain Descriptors / Indicators: Sharp, Stabbing Pain Onset: 1 to 4 weeks ago Pain Frequency: Constant     Nutritional Status: BMI of 19-24  Normal Nutritional Risks: None Diabetes: No  How often do you need to have someone help you when you read instructions, pamphlets, or other written materials from your doctor or pharmacy?: 1 - Never  Interpreter Needed?: No  Information entered by :: Maryjean Ka CMA   Activities of Daily Living     07/16/2023    8:57 AM 07/02/2023   12:37 PM  In your present state of health, do you have any difficulty performing the following activities:  Hearing? 0 0  Vision? 0 0  Difficulty concentrating or making decisions? 0 0  Walking or climbing stairs? 0   Dressing or bathing? 0   Doing errands, shopping? 0 0  Preparing Food and eating ? N   Using the Toilet? N   In the past six months, have you accidently leaked urine? N   Do you have problems with loss of bowel control? N   Managing your Medications? N   Managing your Finances? N   Housekeeping or managing your Housekeeping? N     Patient Care Team: Tommie Sams, DO as PCP - General (Family Medicine) Michaelle Copas, MD as Referring Physician (Optometry) Oliver Barre, MD as Consulting Physician (Orthopedic Surgery) Karie Soda, MD as Consulting Physician (General Surgery) Ahmed, Juanetta Beets, MD as Consulting Physician (Gastroenterology) [facility] (Optometry) Karie Soda, MD as Consulting Physician (General Surgery) Ahmed, Juanetta Beets, MD as Consulting Physician (Gastroenterology) Barbara Cower, FNP (Family  Medicine) [facility] [place] (Optometry)  Indicate any recent Medical Services you may have received from other than [facility] providers in the past year (date may be approximate).     Assessment:   This is a routine wellness examination for Shannon Castaneda.  Hearing/Vision screen Hearing Screening - Comments:: Patient  denies any hearing difficulties.   Vision Screening - Comments:: Patient is not up to date on yearly eye exams.  Patient sees [facility] in [place]. States she can't afford to get new glasses right now. Provided patient with helpful resources to help her obtain glasses.     Goals Addressed             This Visit's Progress    Patient Stated       Get out of debt       Depression Screen     07/16/2023    8:58 AM 01/26/2023   11:32 AM 10/08/2022    4:04 PM 05/15/2022    2:40 PM 03/25/2022   10:30 AM 01/01/2022    1:20 PM 12/15/2021   10:11 AM  PHQ 2/9 Scores  PHQ - 2 Score 0 0 3 0 0 0 0  PHQ- 9 Score 0 6 11        Fall Risk     07/16/2023    8:57 AM 05/15/2022    2:37 PM 03/25/2022   10:30 AM 01/01/2022    1:20 PM 07/22/2021   12:59 PM  Fall Risk   Falls in the past year? 0 0 0 0 0  Number falls in past yr: 0 0  0   Injury with Fall? 0 0  0   Risk for fall due to : No Fall Risks No Fall Risks  No Fall Risks   Follow up Falls prevention discussed;Falls evaluation completed Falls evaluation completed;Education provided;Falls prevention discussed Falls evaluation completed Falls evaluation completed     MEDICARE RISK AT HOME:  Medicare Risk at Home Any stairs in or around the home?: No If so, are there any without handrails?: No Home free of loose throw rugs in walkways, pet beds, electrical cords, etc?: Yes Adequate lighting in your home to reduce risk of falls?: Yes Life alert?: No Use of a cane, walker or w/c?: No Grab bars in the bathroom?: Yes Shower chair or bench in shower?: Yes Elevated toilet seat or a handicapped toilet?:  No  TIMED UP AND GO:  Was the test performed?  No  Cognitive Function: 6CIT completed        07/16/2023    8:50 AM 05/15/2022    2:41 PM 05/06/2021    1:55 PM  6CIT Screen  What Year? 0 points 0 points 0 points  What month? 0 points 0 points 0 points  What time? 0 points 0 points 0 points  Count back from 20 0 points 0 points 0 points  Months in reverse 4 points 0 points 2 points  Repeat phrase 8 points 2 points 2 points  Total Score 12 points 2 points 4 points    Immunizations Immunization History  Administered Date(s) Administered   Influenza Inj Mdck Quad Pf 02/27/2019   Influenza,inj,Quad PF,6+ Mos 01/30/2021   Influenza-Unspecified 01/16/2016   PFIZER(Purple Top)SARS-COV-2 Vaccination 01/04/2020, 01/25/2020   Zoster Recombinant(Shingrix) 02/27/2019, 01/30/2021    Screening Tests Health Maintenance  Topic Date Due   Cervical Cancer Screening (HPV/Pap Cotest)  Never done   MAMMOGRAM  06/01/2017   COVID-19 Vaccine (3 - Pfizer risk series) 02/22/2020   INFLUENZA VACCINE  08/16/2023 (Originally 12/17/2022)   Medicare Annual Wellness (AWV)  07/15/2024   Colonoscopy  03/01/2026   Hepatitis C Screening  Completed   HIV Screening  Completed   Zoster Vaccines- Shingrix  Completed   HPV VACCINES  Aged Out   DTaP/Tdap/Td  Discontinued  Health Maintenance  Health Maintenance Due  Topic Date Due   Cervical Cancer Screening (HPV/Pap Cotest)  Never done   MAMMOGRAM  06/01/2017   COVID-19 Vaccine (3 - Pfizer risk series) 02/22/2020   Health Maintenance Items Addressed: Mammo: patient declined  Additional Screening:  Vision Screening: Recommended annual ophthalmology exams for early detection of glaucoma and other disorders of the eye.  Dental Screening: Recommended annual dental exams for proper oral hygiene  Community Resource Referral / Chronic Care Management: CRR required this visit?  No   CCM required this visit?  No     Plan:     I have personally  reviewed and noted the following in the patient's chart:   Medical and social history Use of alcohol, tobacco or illicit drugs  Current medications and supplements including opioid prescriptions. Patient is currently taking opioid prescriptions. Information provided to patient regarding non-opioid alternatives. Patient advised to discuss non-opioid treatment plan with their provider. Functional ability and status Nutritional status Physical activity Advanced directives List of other physicians Hospitalizations, surgeries, and ER visits in previous 12 months Vitals Screenings to include cognitive, depression, and falls Referrals and appointments  In addition, I have reviewed and discussed with patient certain preventive protocols, quality metrics, and best practice recommendations. A written personalized care plan for preventive services as well as general preventive health recommendations were provided to patient.     Shannon Castaneda, CMA   07/16/2023   After Visit Summary: (MyChart) Due to this being a telephonic visit, the after visit summary with patients personalized plan was offered to patient via MyChart   Notes: Nothing significant to report at this time.

## 2023-07-16 NOTE — Patient Instructions (Signed)
 Ms. Eccleston , Thank you for taking time to come for your Medicare Wellness Visit. I appreciate your ongoing commitment to your health goals. Please review the following plan we discussed and let me know if I can assist you in the future.   Referrals/Orders/Follow-Ups/Clinician Recommendations:   Next Medicare Annual Wellness Visit:  July 21, 2024 at 8:40 am telephone visit.   This is a list of the screening recommended for you and due dates:  Health Maintenance  Topic Date Due   Pap with HPV screening  Never done   Mammogram  06/01/2017   COVID-19 Vaccine (3 - Pfizer risk series) 02/22/2020   Flu Shot  08/16/2023*   Medicare Annual Wellness Visit  07/15/2024   Colon Cancer Screening  03/01/2026   Hepatitis C Screening  Completed   HIV Screening  Completed   Zoster (Shingles) Vaccine  Completed   HPV Vaccine  Aged Out   DTaP/Tdap/Td vaccine  Discontinued  *Topic was postponed. The date shown is not the original due date.    Advanced directives: (Declined) Advance directive discussed with you today. Even though you declined this today, please call our office should you change your mind, and we can give you the proper paperwork for you to fill out.  Next Medicare Annual Wellness Visit scheduled for next year: yes  Understanding Your Risk for Falls Millions of people have serious injuries from falls each year. It is important to understand your risk of falling. Talk with your health care provider about your risk and what you can do to lower it. If you do have a serious fall, make sure to tell your provider. Falling once raises your risk of falling again. How can falls affect me? Serious injuries from falls are common. These include: Broken bones, such as hip fractures. Head injuries, such as traumatic brain injuries (TBI) or concussions. A fear of falling can cause you to avoid activities and stay at home. This can make your muscles weaker and raise your risk for a fall. What can  increase my risk? There are a number of risk factors that increase your risk for falling. The more risk factors you have, the higher your risk of falling. Serious injuries from a fall happen most often to people who are older than 60 years old. Teenagers and young adults ages 75-29 are also at higher risk. Common risk factors include: Weakness in the lower body. Being generally weak or confused due to long-term (chronic) illness. Dizziness or balance problems. Poor vision. Medicines that cause dizziness or drowsiness. These may include: Medicines for your blood pressure, heart, anxiety, insomnia, or swelling (edema). Pain medicines. Muscle relaxants. Other risk factors include: Drinking alcohol. Having had a fall in the past. Having foot pain or wearing improper footwear. Working at a dangerous job. Having any of the following in your home: Tripping hazards, such as floor clutter or loose rugs. Poor lighting. Pets. Having dementia or memory loss. What actions can I take to lower my risk of falling?     Physical activity Stay physically fit. Do strength and balance exercises. Consider taking a regular class to build strength and balance. Yoga and tai chi are good options. Vision Have your eyes checked every year and your prescription for glasses or contacts updated as needed. Shoes and walking aids Wear non-skid shoes. Wear shoes that have rubber soles and low heels. Do not wear high heels. Do not walk around the house in socks or slippers. Use a cane or walker as  told by your provider. Home safety Attach secure railings on both sides of your stairs. Install grab bars for your bathtub, shower, and toilet. Use a non-skid mat in your bathtub or shower. Attach bath mats securely with double-sided, non-slip rug tape. Use good lighting in all rooms. Keep a flashlight near your bed. Make sure there is a clear path from your bed to the bathroom. Use night-lights. Do not use throw  rugs. Make sure all carpeting is taped or tacked down securely. Remove all clutter from walkways and stairways, including extension cords. Repair uneven or broken steps and floors. Avoid walking on icy or slippery surfaces. Walk on the grass instead of on icy or slick sidewalks. Use ice melter to get rid of ice on walkways in the winter. Use a cordless phone. Questions to ask your health care provider Can you help me check my risk for a fall? Do any of my medicines make me more likely to fall? Should I take a vitamin D supplement? What exercises can I do to improve my strength and balance? Should I make an appointment to have my vision checked? Do I need a bone density test to check for weak bones (osteoporosis)? Would it help to use a cane or a walker? Where to find more information Centers for Disease Control and Prevention, STEADI: TonerPromos.no Community-Based Fall Prevention Programs: TonerPromos.no [employer] on Aging: BaseRingTones.pl Contact a health care provider if: You fall at home. You are afraid of falling at home. You feel weak, drowsy, or dizzy. This information is not intended to replace advice given to you by your health care provider. Make sure you discuss any questions you have with your health care provider. Document Revised: 01/05/2022 Document Reviewed: 01/05/2022 Elsevier Patient Education  2024 Elsevier Inc.   Managing Pain Without Opioids Opioids are strong medicines used to treat moderate to severe pain. For some people, especially those who have long-term (chronic) pain, opioids may not be the best choice for pain management due to: Side effects like nausea, constipation, and sleepiness. The risk of addiction (opioid use disorder). The longer you take opioids, the greater your risk of addiction. Pain that lasts for more than 3 months is called chronic pain. Managing chronic pain usually requires more than one approach and is often provided by a team of health care  providers working together (multidisciplinary approach). Pain management may be done at a pain management center or pain clinic. How to manage pain without the use of opioids Use non-opioid medicines Non-opioid medicines for pain may include: Over-the-counter or prescription non-steroidal anti-inflammatory drugs (NSAIDs). These may be the first medicines used for pain. They work well for muscle and bone pain, and they reduce swelling. Acetaminophen. This over-the-counter medicine may work well for milder pain but not swelling. Antidepressants. These may be used to treat chronic pain. A certain type of antidepressant (tricyclics) is often used. These medicines are given in lower doses for pain than when used for depression. Anticonvulsants. These are usually used to treat seizures but may also reduce nerve (neuropathic) pain. Muscle relaxants. These relieve pain caused by sudden muscle tightening (spasms). You may also use a pain medicine that is applied to the skin as a patch, cream, or gel (topical analgesic), such as a numbing medicine. These may cause fewer side effects than medicines taken by mouth. Do certain therapies as directed Some therapies can help with pain management. They include: Physical therapy. You will do exercises to gain strength and flexibility. A physical therapist  may teach you exercises to move and stretch parts of your body that are weak, stiff, or painful. You can learn these exercises at physical therapy visits and practice them at home. Physical therapy may also involve: Massage. Heat wraps or applying heat or cold to affected areas. Electrical signals that interrupt pain signals (transcutaneous electrical nerve stimulation, TENS). Weak lasers that reduce pain and swelling (low-level laser therapy). Signals from your body that help you learn to regulate pain (biofeedback). Occupational therapy. This helps you to learn ways to function at home and work with less  pain. Recreational therapy. This involves trying new activities or hobbies, such as a physical activity or drawing. Mental health therapy, including: Cognitive behavioral therapy (CBT). This helps you learn coping skills for dealing with pain. Acceptance and commitment therapy (ACT) to change the way you think and react to pain. Relaxation therapies, including muscle relaxation exercises and mindfulness-based stress reduction. Pain management counseling. This may be individual, family, or group counseling.  Receive medical treatments Medical treatments for pain management include: Nerve block injections. These may include a pain blocker and anti-inflammatory medicines. You may have injections: Near the spine to relieve chronic back or neck pain. Into joints to relieve back or joint pain. Into nerve areas that supply a painful area to relieve body pain. Into muscles (trigger point injections) to relieve some painful muscle conditions. A medical device placed near your spine to help block pain signals and relieve nerve pain or chronic back pain (spinal cord stimulation device). Acupuncture. Follow these instructions at home Medicines Take over-the-counter and prescription medicines only as told by your health care provider. If you are taking pain medicine, ask your health care providers about possible side effects to watch out for. Do not drive or use heavy machinery while taking prescription opioid pain medicine. Lifestyle  Do not use drugs or alcohol to reduce pain. If you drink alcohol, limit how much you have to: 0-1 drink a day for women who are not pregnant. 0-2 drinks a day for men. Know how much alcohol is in a drink. In the [place] one drink equals one 12 oz bottle of beer (355 mL), one 5 oz glass of wine (148 mL), or one 1 oz glass of hard liquor (44 mL). Do not use any products that contain nicotine or tobacco. These products include cigarettes, chewing tobacco, and vaping  devices, such as e-cigarettes. If you need help quitting, ask your health care provider. Eat a healthy diet and maintain a healthy weight. Poor diet and excess weight may make pain worse. Eat foods that are high in fiber. These include fresh fruits and vegetables, whole grains, and beans. Limit foods that are high in fat and processed sugars, such as fried and sweet foods. Exercise regularly. Exercise lowers stress and may help relieve pain. Ask your health care provider what activities and exercises are safe for you. If your health care provider approves, join an exercise class that combines movement and stress reduction. Examples include yoga and tai chi. Get enough sleep. Lack of sleep may make pain worse. Lower stress as much as possible. Practice stress reduction techniques as told by your therapist. General instructions Work with all your pain management providers to find the treatments that work best for you. You are an important member of your pain management team. There are many things you can do to reduce pain on your own. Consider joining an online or in-person support group for people who have chronic pain. Keep all  follow-up visits. This is important. Where to find more information You can find more information about managing pain without opioids from: American Academy of Pain Medicine: painmed.org Institute for Chronic Pain: instituteforchronicpain.org American Chronic Pain Association: theacpa.org Contact a health care provider if: You have side effects from pain medicine. Your pain gets worse or does not get better with treatments or home therapy. You are struggling with anxiety or depression. Summary Many types of pain can be managed without opioids. Chronic pain may respond better to pain management without opioids. Pain is best managed when you and a team of health care providers work together. Pain management without opioids may include non-opioid medicines, medical  treatments, physical therapy, mental health therapy, and lifestyle changes. Tell your health care providers if your pain gets worse or is not being managed well enough. This information is not intended to replace advice given to you by your health care provider. Make sure you discuss any questions you have with your health care provider. Document Revised: 08/14/2020 Document Reviewed: 08/14/2020 Elsevier Patient Education  2024 [employer].

## 2023-07-26 ENCOUNTER — Telehealth: Payer: Self-pay | Admitting: *Deleted

## 2023-07-26 ENCOUNTER — Ambulatory Visit: Payer: Self-pay | Admitting: Family Medicine

## 2023-07-26 NOTE — Telephone Encounter (Unsigned)
 Copied from CRM [id number]. Topic: Clinical - Medication Question >> Jul 26, 2023  9:11 AM Gaetano Hawthorne wrote: Reason for CRM: Patient claims that her acid reflux has gotten worse since her surgery a few weeks ago - she typically takes one twice a day and she would like to know if she could get something stronger. Please call patient when you have a moment.

## 2023-07-26 NOTE — Telephone Encounter (Signed)
 Pt called, unable to LVM d/t VM full. TE previously opened and message has been routed to provider.  Copied from CRM [id number]. Topic: Clinical - Medication Question >> Jul 26, 2023  1:49 PM Thliyah D wrote: Patient called for a update- Patient claims that her acid reflux has gotten worse since her surgery a few weeks ago - she typically takes one twice a day and she would like to know if she could get something stronger. Please call patient when you have a moment.

## 2023-07-26 NOTE — Telephone Encounter (Signed)
  Chief Complaint: increased reflux Symptoms: upper abdominal pain- recent hiatal hernia repair 07/02/23 Frequency: only after eating/drinking- no other time Pertinent Negatives: Patient denies diarrhea, fever, urination pain, vomiting Disposition: [] ED /[] Urgent Care (no appt availability in office) / [x] Appointment(In office/virtual)/ []  [facility]/ [] Home Care/ [] Refused Recommended Disposition /[] [facility]/ []  Follow-up with PCP Additional Notes: Patient was offered next day appointment- she states she has to arrange transportation- and she has scheduled for Wednesday. Patient advised be seen sooner if she gets worse    Reason for Disposition  [1] MILD pain (e.g., does not interfere with normal activities) AND [2] comes and goes (cramps) AND [3] present > 72 hours  (Exception: This same abdominal pain is a chronic symptom recurrent or ongoing AND present > 4 weeks.)  Answer Assessment - Initial Assessment Questions 1. LOCATION: "Where does it hurt?"      Upper abdomen- reflux  3. ONSET: "When did the pain begin?" (e.g., minutes, hours or days ago)      07/02/23- hiatal hernia 4. SUDDEN: "Gradual or sudden onset?"     gradual 5. PATTERN "Does the pain come and go, or is it constant?"    - If it comes and goes: "How long does it last?" "Do you have pain now?"     (Note: Comes and goes means the pain is intermittent. It goes away completely between bouts.)    - If constant: "Is it getting better, staying the same, or getting worse?"      (Note: Constant means the pain never goes away completely; most serious pain is constant and gets worse.)      Patient states when she eats ot drinks her pain gets worse- it will "wear off" 6. SEVERITY: "How bad is the pain?"  (e.g., Scale 1-10; mild, moderate, or severe)    - MILD (1-3): Doesn't interfere with normal activities, abdomen soft and not tender to touch..     - MODERATE (4-7): Interferes with normal activities or  awakens from sleep, abdomen tender to touch.     - SEVERE (8-10): Excruciating pain, doubled over, unable to do any normal activities.       This morning- 10/10, now 2/10 7. RECURRENT SYMPTOM: "Have you ever had this type of stomach pain before?" If Yes, ask: "When was the last time?" and "What happened that time?"      Reflux- not this bad- worse since surgery/ sometimes before 8. AGGRAVATING FACTORS: "Does anything seem to cause this pain?" (e.g., foods, stress, alcohol)     Eat/drink only 9. CARDIAC SYMPTOMS: "Do you have any of the following symptoms: chest pain, difficulty breathing, sweating, nausea?"     no 10. OTHER SYMPTOMS: "Do you have any other symptoms?" (e.g., back pain, diarrhea, fever, urination pain, vomiting)       Back pain at times  Protocols used: Abdominal Pain - Upper-A-AH

## 2023-07-26 NOTE — Telephone Encounter (Signed)
 Called patient, left vm to return call to office.   Copied from CRM [id number]. Topic: Clinical - Medication Question >> Jul 26, 2023  1:49 PM Thliyah D wrote: Patient called for a update- Patient claims that her acid reflux has gotten worse since her surgery a few weeks ago - she typically takes one twice a day and she would like to know if she could get something stronger. Please call patient when you have a moment.

## 2023-07-28 ENCOUNTER — Ambulatory Visit: Admitting: Family Medicine

## 2023-07-28 NOTE — Telephone Encounter (Signed)
 Patient scheduled 07/30/2023 at 9:40 AM with Eber Jones NP

## 2023-07-29 NOTE — Telephone Encounter (Signed)
 Everlene Other G, DO     Recommend that she discuss with GI.

## 2023-07-30 ENCOUNTER — Ambulatory Visit: Admitting: Nurse Practitioner

## 2023-07-30 ENCOUNTER — Ambulatory Visit (INDEPENDENT_AMBULATORY_CARE_PROVIDER_SITE_OTHER): Admitting: Nurse Practitioner

## 2023-07-30 VITALS — BP 125/70 | HR 68 | Temp 97.9°F | Ht 67.0 in | Wt 120.0 lb

## 2023-07-30 DIAGNOSIS — R1319 Other dysphagia: Secondary | ICD-10-CM | POA: Diagnosis not present

## 2023-07-30 DIAGNOSIS — M419 Scoliosis, unspecified: Secondary | ICD-10-CM | POA: Diagnosis not present

## 2023-07-30 DIAGNOSIS — K2100 Gastro-esophageal reflux disease with esophagitis, without bleeding: Secondary | ICD-10-CM

## 2023-07-30 MED ORDER — SUCRALFATE 1 G PO TABS: 1.0000 g | ORAL_TABLET | Freq: Three times a day (TID) | ORAL | 0 refills | Status: DC

## 2023-07-30 NOTE — Patient Instructions (Addendum)
 Stop Fosamax. Repeat your bone density test in early August. Cut back on caffeine intake. Start Sucralfate one tablet before meals and at bedtime as needed for acid reflux. May dissolve this to make a slurry and drink. Follow up with your GI specialist on Tuesday as planned.  Take Pantoprazole twice daily as prescribed.

## 2023-07-31 ENCOUNTER — Encounter: Payer: Self-pay | Admitting: Nurse Practitioner

## 2023-07-31 NOTE — Progress Notes (Signed)
 Subjective:    Patient ID: Shannon Castaneda, female    DOB: [birth year], 58 y.o.   MRN: [id number]  HPI Presents for complaints of worsening acid reflux.  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  Feels her symptoms have minimally improved.  States it feels like her food is getting stuck in her mid chest area.  Fatigue.  Currently on pantoprazole 40 mg twice daily.  Yesterday on her own took 4 in the morning and 4 at nighttime.  States this is the only time she has done this.  No nausea or vomiting.  Can swallow soft foods without difficulty.  Stinging sensation in the mid abdominal area at the site of her procedure.  No fevers.  Has occasional mild constipation relieved with Metamucil.  Stools are normal color.  Denies any tobacco use.  No vaping.  No alcohol use.  No NSAID use.  Drinks 2 to 3 cups of coffee every morning and regular tea during the day.  Has an upcoming appointment with her GI specialist on 3/18. Notes she has been on long-term Fosamax for osteopenia and kyphosis.  This was discontinued by gastroenterologist due to her persistent GI issues.  Review of Systems  Constitutional:  Positive for fatigue. Negative for fever.  Respiratory:  Negative for cough, chest tightness and shortness of breath.   Cardiovascular:  Positive for chest pain.       Localized discomfort in the mid chest area after eating when she feels her food is not going down.  Unassociated with activity.  Gastrointestinal:  Positive for abdominal pain and constipation. Negative for blood in stool, diarrhea, nausea and vomiting.      07/30/2023    9:30 AM  Depression screen PHQ 2/9  Decreased Interest 0  Down, Depressed, Hopeless 0  PHQ - 2 Score 0  Altered sleeping 0  Tired, decreased energy 0  Change in appetite 1  Feeling bad or failure about yourself  0  Trouble concentrating 1  Moving slowly or fidgety/restless 3  Suicidal thoughts 0  PHQ-9 Score 5      01/26/2023   11:33 AM  GAD 7 :  Generalized Anxiety Score  Nervous, Anxious, on Edge 0  Control/stop worrying 0  Worry too much - different things 1  Trouble relaxing 0  Restless 0  Easily annoyed or irritable 0  Afraid - awful might happen 0  Total GAD 7 Score 1  Anxiety Difficulty Very difficult   Social History   Tobacco Use   Smoking status: Never    Passive exposure: Never   Smokeless tobacco: Never  Vaping Use   Vaping status: Never Used  Substance Use Topics   Alcohol use: No   Drug use: No         Objective:   Physical Exam NAD.  Alert, oriented.  Lungs clear.  Heart regular rate rhythm.  Abdomen soft mildly distended in the mid abdominal area near the umbilicus with 2 fading areas of ecchymoses noted.  Bowel sounds present x 4 quadrants.  Tenderness mainly around the site of her recent procedure.  No evidence of infection.  Minimal epigastric area tenderness noted.  Abdominal exam otherwise benign. Today's Vitals   07/30/23 0927  BP: 125/70  Pulse: 68  Temp: 97.9 F (36.6 C)  SpO2: 99%  Weight: 120 lb (54.4 kg)  Height: 5\' 7"  (1.702 m)   Body mass index is 18.79 kg/m.        Assessment & Plan:  Problem List Items Addressed This Visit       Digestive   Esophageal dysphagia   Gastroesophageal reflux disease with esophagitis without hemorrhage - Primary     Musculoskeletal and Integument   Kyphoscoliosis   Meds ordered this encounter  Medications   sucralfate (CARAFATE) 1 g tablet    Sig: Take 1 tablet (1 g total) by mouth 4 (four) times daily -  with meals and at bedtime. Prn acid reflux    Dispense:  40 tablet    Refill:  0    Supervising Provider:   Lilyan Punt A [[id number]]   Stop Fosamax.  Repeat bone density test in early August at the 2-year mark.  At that time we will decide what interventions may be needed and appropriate.  Consider other treatments that are safer for her considering her GI history. Slowly reduce caffeine intake. Start sucralfate as directed as  needed.  Recommend she dissolve this in a small amount of water and take as a slurry. Follow-up with GI specialist on Tuesday as planned.  Warning signs reviewed.  Seek help this weekend if any new or worsening symptoms. Recommend she go back to her previous pantoprazole dose and only take twice daily as prescribed. Agrees and verbalizes understanding of the plan.  Recheck here as needed.

## 2023-08-10 ENCOUNTER — Telehealth: Payer: Self-pay | Admitting: Family Medicine

## 2023-08-10 NOTE — Telephone Encounter (Signed)
Refill on

## 2023-08-16 ENCOUNTER — Other Ambulatory Visit: Payer: Self-pay | Admitting: Family Medicine

## 2023-08-16 ENCOUNTER — Ambulatory Visit: Payer: Self-pay | Admitting: Family Medicine

## 2023-08-16 NOTE — Telephone Encounter (Signed)
 Copied from CRM [id number]. Topic: Clinical - Medication Question >> Aug 16, 2023  8:59 AM Elle L wrote: Reason for CRM: The patient is requesting a different medication for her acid reflux as the current medication is not helping. She is requesting to see if she can be prescribed something for energy as well. She declined an appointment. The patient's call back number is [phone].   Preferred Pharmacy: [facility] [phone] - [place], [place] - [address] AT [facility] [address], [place] [address] Phone: [phone]  Fax: [phone]  Chief Complaint: Acid reflux + fatigue Symptoms: Back and chest pain due to reflux, lack of energy Frequency: Ongoing Pertinent Negatives: Patient denies SOB Disposition: [] ED /[] Urgent Care (no appt availability in office) / [x] Appointment(In office/virtual)/ []  [place] [facility]/ [] Home Care/ [x] Refused Recommended Disposition /[] [place] [facility]/ []  Follow-up with PCP Additional Notes: Patient called in to request a different medication for her acid reflux. Patient stated acid reflux was worse last night and caused back and chest pain. Patient also reported lack of energy/fatigue over the past 4-5 months. Patient denied the following: SOB, cough, NVD and pain elsewhere. This RN advised patient to follow-up with provider office. Patient declined appointment and stated she was recently in the office. Routing to office for further action.  Reason for Disposition  Fatigue is a chronic symptom (recurrent or ongoing AND present > 4 weeks)  Answer Assessment - Initial Assessment Questions 1. DESCRIPTION: "Describe how you are feeling."     Tiredness and fatigue, states sometimes she gets "weak" and has no energy 2. SEVERITY: "How bad is it?"  "Can you stand and walk?"   - MILD (0-3): Feels weak or tired, but does not interfere with work, school or normal activities.   - MODERATE (4-7): Able to stand and  walk; weakness interferes with work, school, or normal activities.   - SEVERE (8-10): Unable to stand or walk; unable to do usual activities.     States symptoms do not interfere with walking 3. ONSET: "When did these symptoms begin?" (e.g., hours, days, weeks, months)     4-5 months ago 4. CAUSE: "What do you think is causing the weakness or fatigue?" (e.g., not drinking enough fluids, medical problem, trouble sleeping)     Unknown 5. NEW MEDICINES:  "Have you started on any new medicines recently?" (e.g., opioid pain medicines, benzodiazepines, muscle relaxants, antidepressants, antihistamines, neuroleptics, beta blockers)     Denies 6. OTHER SYMPTOMS: "Do you have any other symptoms?" (e.g., chest pain, fever, cough, SOB, vomiting, diarrhea, bleeding, other areas of pain)     Heartburn, back pain related to heart burn, sharp pains in chest when she gets acid reflux, denies SOB, denies cough, denies NVD, denies pain elsewhere  Protocols used: Weakness (Generalized) and Fatigue-A-AH

## 2023-08-17 ENCOUNTER — Ambulatory Visit (INDEPENDENT_AMBULATORY_CARE_PROVIDER_SITE_OTHER): Admitting: Family Medicine

## 2023-08-17 ENCOUNTER — Ambulatory Visit: Payer: Self-pay

## 2023-08-17 VITALS — BP 124/82 | HR 72 | Temp 98.1°F

## 2023-08-17 DIAGNOSIS — R7303 Prediabetes: Secondary | ICD-10-CM | POA: Diagnosis not present

## 2023-08-17 DIAGNOSIS — E785 Hyperlipidemia, unspecified: Secondary | ICD-10-CM

## 2023-08-17 DIAGNOSIS — R5382 Chronic fatigue, unspecified: Secondary | ICD-10-CM

## 2023-08-17 DIAGNOSIS — K219 Gastro-esophageal reflux disease without esophagitis: Secondary | ICD-10-CM

## 2023-08-17 MED ORDER — FAMOTIDINE 20 MG PO TABS: 20.0000 mg | ORAL_TABLET | Freq: Two times a day (BID) | ORAL | 1 refills | Status: DC

## 2023-08-17 MED ORDER — DEXLANSOPRAZOLE 60 MG PO CPDR: 60.0000 mg | DELAYED_RELEASE_CAPSULE | Freq: Every day | ORAL | 3 refills | Status: DC

## 2023-08-17 NOTE — Assessment & Plan Note (Signed)
 Labs today for further evaluation.

## 2023-08-17 NOTE — Patient Instructions (Signed)
 Medication as directed.  Labs today.

## 2023-08-17 NOTE — Telephone Encounter (Signed)
 Noted.

## 2023-08-17 NOTE — Telephone Encounter (Signed)
 Copied from CRM [id number]. Topic: Clinical - Red Word Triage >> Aug 17, 2023  8:22 AM Patsy Lager T wrote: [facility] that prompted transfer to Nurse Triage: patient called stated she is tired all the time with absolutely no energy. She also experiencing acid reflux.    Chief Complaint: Fatigue. "I go to bed tired and get up tired." Symptoms: Above Frequency: 5 months Pertinent Negatives: Patient denies  Disposition: [] ED /[] Urgent Care (no appt availability in office) / [x] Appointment(In office/virtual)/ []  [place] [facility]/ [] Home Care/ [] Refused Recommended Disposition /[] [place] [facility]/ []  Follow-up with PCP Additional Notes: Agrees with appointment.  Reason for Disposition  [1] MODERATE weakness (i.e., interferes with work, school, normal activities) AND [2] persists > 3 days  Answer Assessment - Initial Assessment Questions 1. DESCRIPTION: "Describe how you are feeling."     Feels tired 2. SEVERITY: "How bad is it?"  "Can you stand and walk?"   - MILD (0-3): Feels weak or tired, but does not interfere with work, school or normal activities.   - MODERATE (4-7): Able to stand and walk; weakness interferes with work, school, or normal activities.   - SEVERE (8-10): Unable to stand or walk; unable to do usual activities.     Mild 3. ONSET: "When did these symptoms begin?" (e.g., hours, days, weeks, months)     5 months ago 4. CAUSE: "What do you think is causing the weakness or fatigue?" (e.g., not drinking enough fluids, medical problem, trouble sleeping)     Unsure 5. NEW MEDICINES:  "Have you started on any new medicines recently?" (e.g., opioid pain medicines, benzodiazepines, muscle relaxants, antidepressants, antihistamines, neuroleptics, beta blockers)     No 6. OTHER SYMPTOMS: "Do you have any other symptoms?" (e.g., chest pain, fever, cough, SOB, vomiting, diarrhea, bleeding, other areas of pain)     No 7. PREGNANCY: "Is there any chance you are pregnant?" "When was  your last menstrual period?"     No  Protocols used: Weakness (Generalized) and Fatigue-A-AH

## 2023-08-17 NOTE — Progress Notes (Signed)
 Subjective:  Patient ID: Shannon Castaneda, female    DOB: 02/03/1966  Age: 58 y.o. MRN: [id number]  CC: Persistent GERD and fatigue   HPI:  57 year old female presents for evaluation of the above  Patient is status post hiatal hernia repair with partial fundoplication.  She has been prescribed Protonix twice a day and Carafate as well.  She states that these medications do not seem to be helping her heartburn.  She states that she has heartburn and associated back pain.  She appears to have some dietary indiscretion and has been drinking Coca-Cola.  She was advised against this by her surgeon.  Patient also continues to report fatigue. Fatigue is chronic, > 1 year.  Prior labs negative.  Patient endorses that she falls asleep fairly easily and then wakes up at least 1 time in the night and has a little trouble going back to sleep.  This could be contributing to her chronic fatigue.  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   Chronic idiopathic constipation 02/02/2023   Restless legs 02/12/2021   Gastroesophageal reflux disease 04/28/2019   Vitamin D deficiency 04/28/2019   Degeneration of lumbar or lumbosacral intervertebral disc 03/17/2016   Chronic back pain 03/17/2016   HLD (hyperlipidemia) 03/17/2016    Social Hx   Social History   Socioeconomic History   Marital status: Single    Spouse name: Not on file   Number of children: 1   Years of education: 12   Highest education level: Not on file  Occupational History    Comment: disability  Tobacco Use   Smoking status: Never    Passive exposure: Never   Smokeless tobacco: Never  Vaping Use   Vaping status: Never Used  Substance and Sexual Activity   Alcohol use: No   Drug use: No   Sexual activity: Yes    Birth control/protection: Pill   Other Topics Concern   Not on file  Social History Narrative   Lives with daughter age 44, granddaughter   Disabled due to "slow learner"      Lives alone    Social Drivers of Health   Financial Resource Strain: Low Risk  (07/16/2023)   Overall Financial Resource Strain (CARDIA)    Difficulty of Paying Living Expenses: Not hard at all  Food Insecurity: No Food Insecurity (07/16/2023)   Hunger Vital Sign    Worried About Running Out of Food in the Last Year: Never true    Ran Out of Food in the Last Year: Never true  Transportation Needs: No Transportation Needs (07/16/2023)   PRAPARE - Administrator, Civil Service (Medical): No    Lack of Transportation (Non-Medical): No  Physical Activity: Insufficiently Active (07/16/2023)   Exercise Vital Sign    Days of Exercise per Week: 7 days    Minutes of Exercise per Session: 20 min  Stress: No Stress Concern Present (07/16/2023)   [employer] of Occupational Health - Occupational Stress Questionnaire    Feeling of Stress : Only a little  Social Connections: Unknown (07/16/2023)   Social Connection and Isolation Panel [NHANES]    Frequency of Communication with Friends and Family: More than three times a week    Frequency of Social Gatherings with Friends and Family: Once a week    Attends Religious Services: Never  Active Member of Clubs or Organizations: No    Attends Banker Meetings: Never    Marital Status: Patient declined    Review of Systems Per HPI  Objective:  BP 124/82   Pulse 72   Temp 98.1 F (36.7 C)   LMP 10/27/2010   SpO2 100%      08/17/2023   10:42 AM 07/30/2023    9:27 AM 07/16/2023    8:46 AM  BP/Weight  Systolic BP 124 125 --  Diastolic BP 82 70 --  Wt. (Lbs)  120 124  BMI  18.79 kg/m2 19.42 kg/m2    Physical Exam Vitals and nursing note reviewed.  Constitutional:      General: She is not in acute distress. HENT:     Head: Normocephalic and atraumatic.   Cardiovascular:     Rate and Rhythm: Normal rate and regular rhythm.  Pulmonary:     Effort: Pulmonary effort is normal.     Breath sounds: Normal breath sounds.  Neurological:     Mental Status: She is alert.  Psychiatric:     Comments: Flat affect.  Depressed mood.     Lab Results  Component Value Date   WBC 5.6 06/29/2023   HGB 12.1 06/29/2023   HCT 37.9 06/29/2023   PLT 261 06/29/2023   GLUCOSE 94 03/25/2022   CHOL 250 (H) 03/25/2022   TRIG 119 03/25/2022   HDL 80 03/25/2022   LDLCALC 149 (H) 03/25/2022   ALT 40 (H) 03/25/2022   AST 32 03/25/2022   NA 138 03/25/2022   K 4.6 03/25/2022   CL 99 03/25/2022   CREATININE 0.83 03/25/2022   BUN 10 03/25/2022   CO2 23 03/25/2022   TSH 2.710 09/02/2022   INR 1.00 07/23/2011   HGBA1C 5.9 (H) 12/15/2021     Assessment & Plan:  Gastroesophageal reflux disease, unspecified whether esophagitis present Assessment & Plan: Uncontrolled/worsening.  Starting Dexilant if insurance will approve.  Pepcid 20 mg twice daily.   Hyperlipidemia, unspecified hyperlipidemia type -     Lipid panel  Prediabetes -     Hemoglobin A1c  Chronic fatigue Assessment & Plan: Labs today for further evaluation.  Orders: -     CBC -     CMP14+EGFR -     TSH  Other orders -     Dexlansoprazole; Take 1 capsule (60 mg total) by mouth daily.  Dispense: 30 capsule; Refill: 3 -     Famotidine; Take 1 tablet (20 mg total) by mouth 2 (two) times daily.  Dispense: 180 tablet; Refill: 1    Follow-up:  Pending workup  Muranda Coye Adriana Simas DO [facility]

## 2023-08-17 NOTE — Assessment & Plan Note (Signed)
 Uncontrolled/worsening.  Starting Dexilant if insurance will approve.  Pepcid 20 mg twice daily.

## 2023-08-18 ENCOUNTER — Encounter: Payer: Self-pay | Admitting: Family Medicine

## 2023-08-18 LAB — CBC
Hematocrit: 37.9 % (ref 34.0–46.6)
Hemoglobin: 12.2 g/dL (ref 11.1–15.9)
MCH: 29.8 pg (ref 26.6–33.0)
MCHC: 32.2 g/dL (ref 31.5–35.7)
MCV: 93 fL (ref 79–97)
Platelets: 227 10*3/uL (ref 150–450)
RBC: 4.09 x10E6/uL (ref 3.77–5.28)
RDW: 13.7 % (ref 11.7–15.4)
WBC: 9.1 10*3/uL (ref 3.4–10.8)

## 2023-08-18 LAB — CMP14+EGFR
ALT: 18 IU/L (ref 0–32)
AST: 25 IU/L (ref 0–40)
Albumin: 4.6 g/dL (ref 3.8–4.9)
Alkaline Phosphatase: 82 IU/L (ref 44–121)
BUN/Creatinine Ratio: 11 (ref 9–23)
BUN: 9 mg/dL (ref 6–24)
Bilirubin Total: 0.2 mg/dL (ref 0.0–1.2)
CO2: 23 mmol/L (ref 20–29)
Calcium: 9.8 mg/dL (ref 8.7–10.2)
Chloride: 102 mmol/L (ref 96–106)
Creatinine, Ser: 0.84 mg/dL (ref 0.57–1.00)
Globulin, Total: 2.6 g/dL (ref 1.5–4.5)
Glucose: 101 mg/dL — ABNORMAL HIGH (ref 70–99)
Potassium: 4.6 mmol/L (ref 3.5–5.2)
Sodium: 139 mmol/L (ref 134–144)
Total Protein: 7.2 g/dL (ref 6.0–8.5)
eGFR: 80 mL/min/{1.73_m2} (ref >59)

## 2023-08-18 LAB — LIPID PANEL
Chol/HDL Ratio: 2.2 ratio (ref 0.0–4.4)
Cholesterol, Total: 170 mg/dL (ref 100–199)
HDL: 79 mg/dL (ref >39)
LDL Chol Calc (NIH): 72 mg/dL (ref 0–99)
Triglycerides: 106 mg/dL (ref 0–149)
VLDL Cholesterol Cal: 19 mg/dL (ref 5–40)

## 2023-08-18 LAB — HEMOGLOBIN A1C
Est. average glucose Bld gHb Est-mCnc: 123 mg/dL
Hgb A1c MFr Bld: 5.9 % — ABNORMAL HIGH (ref 4.8–5.6)

## 2023-08-18 LAB — TSH: TSH: 1.85 u[IU]/mL (ref 0.450–4.500)

## 2023-08-19 ENCOUNTER — Ambulatory Visit: Payer: Self-pay

## 2023-08-19 NOTE — Telephone Encounter (Signed)
 Info only call: Pt called to go over lab results. RN answered questions on  A1c and how to manage with diet and exercise. Pt had no further questions.         Copied from CRM [id number]. Topic: Clinical - Lab/Test Results >> Aug 19, 2023 10:42 AM [name] wrote: Reason for CRM: pt has further questions    Tommie Sams, DO to [facility]  Regarding results: 5     08/18/23 10:24 PM Result Note No appreciable cause for chronic fatigue. Reason for Disposition  Health Information question, no triage required and triager able to answer question  Protocols used: Information Only Call - No Triage-A-AH

## 2023-08-24 ENCOUNTER — Telehealth: Payer: Self-pay | Admitting: Family Medicine

## 2023-08-24 NOTE — Telephone Encounter (Signed)
 Copied from CRM [id number]. Topic: Clinical - Lab/Test Results >> Aug 19, 2023 10:42 AM [name] wrote: Reason for CRM: pt has further questions    Tommie Sams, DO to [facility]  Regarding results: 5     08/18/23 10:24 PM Result Note No appreciable cause for chronic fatigue.  Called to see if her labs showed that she had "sugar".  This RN able to answer her questions.  No triage needed.

## 2023-08-27 ENCOUNTER — Ambulatory Visit: Payer: Self-pay

## 2023-08-27 NOTE — Telephone Encounter (Signed)
 Chief Complaint: abd pain Symptoms: abd pain Frequency: x 2weeks Pertinent Negatives: Patient denies vomiting Disposition: [] ED /[] Urgent Care (no appt availability in office) / [x] Appointment(In office/virtual)/ []  [place] [facility]/ [] Home Care/ [] Refused Recommended Disposition /[] [place] [facility]/ []  Follow-up with PCP Additional Notes: pt states for 2 weeks she has been having some epigastric pain, states that sometimes when she eats the pain is worse. States that she also had some diarrhea a few times over the last 2 weeks. States that at times the pain moves the left side of her abd.   Copied from CRM [id number]. Topic: Clinical - Red Word Triage >> Aug 27, 2023 11:13 AM Marissa P wrote: [facility] that prompted transfer to Nurse Triage: Patient called in because she is currently experiencing pain in stomach; wanting to see if she can possibly get a referral to get a xray for her stomach done somewhere if possible. Reason for Disposition  [1] MODERATE pain (e.g., interferes with normal activities) AND [2] pain comes and goes (cramps) AND [3] present > 24 hours  (Exception: Pain with Vomiting or Diarrhea - see that Guideline.)  Answer Assessment - Initial Assessment Questions 1. LOCATION: "Where does it hurt?"      Epigastric pain 2. RADIATION: "Does the pain shoot anywhere else?" (e.g., chest, back)     Sometimes goes to left side 3. ONSET: "When did the pain begin?" (e.g., minutes, hours or days ago)      X 2 weeks ago 4. SUDDEN: "Gradual or sudden onset?"     gradual 5. PATTERN "Does the pain come and go, or is it constant?"    - If it comes and goes: "How long does it last?" "Do you have pain now?"     (Note: Comes and goes means the pain is intermittent. It goes away completely between bouts.)    - If constant: "Is it getting better, staying the same, or getting worse?"      (Note: Constant means the pain never goes away completely; most serious pain is constant and gets  worse.)      Goes and comes 6. SEVERITY: "How bad is the pain?"  (e.g., Scale 1-10; mild, moderate, or severe)    - MILD (1-3): Doesn't interfere with normal activities, abdomen soft and not tender to touch.     - MODERATE (4-7): Interferes with normal activities or awakens from sleep, abdomen tender to touch.     - SEVERE (8-10): Excruciating pain, doubled over, unable to do any normal activities.       9/10 7. RECURRENT SYMPTOM: "Have you ever had this type of stomach pain before?" If Yes, ask: "When was the last time?" and "What happened that time?"      Yes before hernia repair 8. CAUSE: "What do you think is causing the stomach pain?"     bruise 9. RELIEVING/AGGRAVATING FACTORS: "What makes it better or worse?" (e.g., antacids, bending or twisting motion, bowel movement)     Sometimes when eating 10. OTHER SYMPTOMS: "Do you have any other symptoms?" (e.g., back pain, diarrhea, fever, urination pain, vomiting)       Diarrhea x 2 weeks  Protocols used: Abdominal Pain - Female-A-AH

## 2023-08-30 ENCOUNTER — Ambulatory Visit (INDEPENDENT_AMBULATORY_CARE_PROVIDER_SITE_OTHER): Admitting: Family Medicine

## 2023-08-30 ENCOUNTER — Ambulatory Visit (HOSPITAL_COMMUNITY)
Admission: RE | Admit: 2023-08-30 | Discharge: 2023-08-30 | Disposition: A | Discharge status: Home / Self Care | Source: Ambulatory Visit | Attending: Family Medicine | Admitting: Family Medicine

## 2023-08-30 ENCOUNTER — Encounter: Payer: Self-pay | Admitting: Family Medicine

## 2023-08-30 DIAGNOSIS — M778 Other enthesopathies, not elsewhere classified: Secondary | ICD-10-CM | POA: Diagnosis not present

## 2023-08-30 DIAGNOSIS — G8929 Other chronic pain: Secondary | ICD-10-CM | POA: Diagnosis not present

## 2023-08-30 DIAGNOSIS — R140 Abdominal distension (gaseous): Secondary | ICD-10-CM | POA: Diagnosis not present

## 2023-08-30 DIAGNOSIS — M25512 Pain in left shoulder: Secondary | ICD-10-CM | POA: Diagnosis not present

## 2023-08-30 DIAGNOSIS — M19012 Primary osteoarthritis, left shoulder: Secondary | ICD-10-CM | POA: Diagnosis not present

## 2023-08-30 DIAGNOSIS — R1010 Upper abdominal pain, unspecified: Secondary | ICD-10-CM | POA: Insufficient documentation

## 2023-08-30 DIAGNOSIS — R109 Unspecified abdominal pain: Secondary | ICD-10-CM | POA: Diagnosis not present

## 2023-08-30 DIAGNOSIS — K449 Diaphragmatic hernia without obstruction or gangrene: Secondary | ICD-10-CM | POA: Diagnosis not present

## 2023-08-30 MED ORDER — DICYCLOMINE HCL 20 MG PO TABS: 20.0000 mg | ORAL_TABLET | Freq: Four times a day (QID) | ORAL | 1 refills | Status: AC | PRN

## 2023-08-30 NOTE — Progress Notes (Signed)
 Subjective:  Patient ID: Shannon Castaneda, female    DOB: 08/22/1965  Age: 58 y.o. MRN: [id number]  CC:   Chief Complaint  Patient presents with   Abdominal Pain    Cramps after meals, had diarrhea past 2 weeks now subsided, nausea yesterday  Wants referral and xrays    HPI:  58 year old female presents for evaluation of the above.  Patient continues to have issues with upper abdominal pain.  Has had recent hiatal hernia repair and states that she has had difficulty since then.  She has been seen more than 1 time regarding reflux which is uncontrolled.  Has not seen GI since her hiatal hernia surgery.  Has seen her surgeon.  Patient states that her abdominal pain feels like cramping.  Seems to be worse when she eats.  Additionally, patient reports left upper arm pain.  Worse with range of motion particularly overhead activity.  She states that this is also become a problem since her hiatal hernia surgery.  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   Chronic idiopathic constipation 02/02/2023   Restless legs 02/12/2021   Gastroesophageal reflux disease 04/28/2019   Vitamin D deficiency 04/28/2019   Degeneration of lumbar or lumbosacral intervertebral disc 03/17/2016   Chronic back pain 03/17/2016   HLD (hyperlipidemia) 03/17/2016    Social Hx   Social History   Socioeconomic History   Marital status: Single    Spouse name: Not on file   Number of children: 1   Years of education: 12   Highest education level: Not on file  Occupational History    Comment: disability  Tobacco Use   Smoking status: Never    Passive exposure: Never   Smokeless tobacco: Never  Vaping Use   Vaping status: Never Used  Substance and Sexual  Activity   Alcohol use: No   Drug use: No   Sexual activity: Yes    Birth control/protection: Pill  Other Topics Concern   Not on file  Social History Narrative   Lives with daughter age 84, granddaughter   Disabled due to "slow learner"      Lives alone    Social Drivers of Health   Financial Resource Strain: Low Risk  (07/16/2023)   Overall Financial Resource Strain (CARDIA)    Difficulty of Paying Living Expenses: Not hard at all  Food Insecurity: No Food Insecurity (07/16/2023)   Hunger Vital Sign    Worried About Running Out of Food in the Last Year: Never true    Ran Out of Food in the Last Year: Never true  Transportation Needs: No Transportation Needs (07/16/2023)   PRAPARE - Administrator, Civil Service (Medical): No    Lack of Transportation (Non-Medical): No  Physical Activity: Insufficiently Active (07/16/2023)   Exercise Vital Sign    Days of Exercise per Week: 7 days    Minutes of Exercise per Session: 20 min  Stress: No Stress Concern Present (07/16/2023)   [employer] of Occupational Health - Occupational Stress Questionnaire    Feeling of Stress : Only a little  Social Connections: Unknown (07/16/2023)   Social Connection and Isolation Panel [NHANES]    Frequency of Communication with Friends and Family: More than three times  a week    Frequency of Social Gatherings with Friends and Family: Once a week    Attends Religious Services: Never    Database administrator or Organizations: No    Attends Engineer, structural: Never    Marital Status: Patient declined    Review of Systems Per HPI  Objective:  BP 119/74   Pulse 72   Temp 97.7 F (36.5 C)   Ht 5\' 7"  (1.702 m)   Wt 120 lb (54.4 kg)   LMP 10/27/2010   SpO2 100%   BMI 18.79 kg/m      08/30/2023   10:31 AM 08/17/2023   10:42 AM 07/30/2023    9:27 AM  BP/Weight  Systolic BP 119 124 125  Diastolic BP 74 82 70  Wt. (Lbs) 120  120  BMI 18.79 kg/m2  18.79 kg/m2     Physical Exam Constitutional:      General: She is not in acute distress. HENT:     Head: Normocephalic and atraumatic.  Eyes:     General:        Right eye: No discharge.        Left eye: No discharge.     Conjunctiva/sclera: Conjunctivae normal.  Cardiovascular:     Rate and Rhythm: Normal rate and regular rhythm.  Pulmonary:     Effort: Pulmonary effort is normal.     Breath sounds: Normal breath sounds.  Abdominal:     General: There is no distension.     Palpations: Abdomen is soft.     Tenderness: There is no abdominal tenderness.  Musculoskeletal:     Comments: Shoulder: Left Inspection reveals no abnormalities, atrophy or asymmetry. Palpation is normal with no tenderness over AC joint or bicipital groove. Rotator cuff strength normal throughout. + Hawkin's test.    Neurological:     Mental Status: She is alert.  Psychiatric:     Comments: Flat affect     Lab Results  Component Value Date   WBC 9.1 08/17/2023   HGB 12.2 08/17/2023   HCT 37.9 08/17/2023   PLT 227 08/17/2023   GLUCOSE 101 (H) 08/17/2023   CHOL 170 08/17/2023   TRIG 106 08/17/2023   HDL 79 08/17/2023   LDLCALC 72 08/17/2023   ALT 18 08/17/2023   AST 25 08/17/2023   NA 139 08/17/2023   K 4.6 08/17/2023   CL 102 08/17/2023   CREATININE 0.84 08/17/2023   BUN 9 08/17/2023   CO2 23 08/17/2023   TSH 1.850 08/17/2023   INR 1.00 07/23/2011   HGBA1C 5.9 (H) 08/17/2023     Assessment & Plan:  Chronic left shoulder pain Assessment & Plan: X-ray for further evaluation.  Orders: -     DG Shoulder Left  Upper abdominal pain Assessment & Plan: KUB for for evaluation.  Dicyclomine as directed.  Advised follow-up with GI.  Orders: -     DG Abd 1 View  Other orders -     Dicyclomine HCl; Take 1 tablet (20 mg total) by mouth 4 (four) times daily as needed for spasms.  Dispense: 120 tablet; Refill: 1    Deniese Oberry DO [facility]

## 2023-08-30 NOTE — Telephone Encounter (Signed)
 Patient was seen this morning by provider

## 2023-08-30 NOTE — Assessment & Plan Note (Signed)
X-ray for further evaluation.

## 2023-08-30 NOTE — Telephone Encounter (Signed)
 Cook, Jayce G, DO     Needs to see GI

## 2023-08-30 NOTE — Patient Instructions (Signed)
 Medication as prescribed.  Xray today.  Call Dr. Alita Irwin for follow up.

## 2023-08-30 NOTE — Assessment & Plan Note (Addendum)
 KUB for for evaluation.  Dicyclomine as directed.  Advised follow-up with GI.

## 2023-09-01 ENCOUNTER — Telehealth: Payer: Self-pay

## 2023-09-01 NOTE — Telephone Encounter (Signed)
 Pt called in to get results for X-ray of left shoulder. Informed pt results are not currently in we will give her a call when the Dr Debrah Fan reviews

## 2023-09-06 ENCOUNTER — Encounter: Payer: Self-pay | Admitting: Family Medicine

## 2023-09-07 ENCOUNTER — Other Ambulatory Visit: Payer: Self-pay

## 2023-09-07 DIAGNOSIS — G8929 Other chronic pain: Secondary | ICD-10-CM

## 2023-09-30 ENCOUNTER — Ambulatory Visit: Admitting: Orthopedic Surgery

## 2023-09-30 VITALS — BP 111/67 | HR 77 | Ht 67.0 in | Wt 119.0 lb

## 2023-09-30 DIAGNOSIS — M25512 Pain in left shoulder: Secondary | ICD-10-CM | POA: Diagnosis not present

## 2023-09-30 DIAGNOSIS — M7542 Impingement syndrome of left shoulder: Secondary | ICD-10-CM

## 2023-09-30 MED ORDER — METHYLPREDNISOLONE ACETATE 40 MG/ML IJ SUSP
40.0000 mg | Freq: Once | INTRAMUSCULAR | Status: AC
  Administered 2023-09-30: 40 mg via INTRA_ARTICULAR

## 2023-09-30 NOTE — Progress Notes (Signed)
 Assessment and plan  Encounter Diagnoses  Name Primary?   Acute pain of left shoulder Yes   Impingement syndrome of left shoulder, rule out rotator cuff tear left shoulder     58 year old female acute onset of pain left shoulder with no trauma has some signs of bursitis tendinitis versus partial rotator cuff tear  Recommend subacromial injection  Physical therapy  Recheck 8 weeks if no improvement proceed with further imaging to rule out rotator cuff tear.     Intake history:  BP 111/67   Pulse 77   Ht 5\' 7"  (1.702 m)   Wt 119 lb (54 kg)   LMP 10/27/2010   BMI 18.64 kg/m  Body mass index is 18.64 kg/m.    WHAT ARE WE SEEING YOU FOR TODAY?   left shoulder  How long has this bothered you? (DOI?DOS?WS?)  approximately 3 month(s) ago  Anticoag.  No  Diabetes No  Heart disease No  Hypertension No  SMOKING HX No  Kidney disease No  Any ALLERGIES ______________________________________________   Treatment:  Have you taken:  Tylenol  Yes  Advil  No  Had PT Yes  Had injection No  Other  Pain patches_____________________   History as noted above.  Exam findings were as follows.  Physical Exam Vitals and nursing note reviewed.  Constitutional:      Appearance: Normal appearance.  HENT:     Head: Normocephalic and atraumatic.  Eyes:     General: No scleral icterus.       Right eye: No discharge.        Left eye: No discharge.     Extraocular Movements: Extraocular movements intact.     Conjunctiva/sclera: Conjunctivae normal.     Pupils: Pupils are equal, round, and reactive to light.  Cardiovascular:     Rate and Rhythm: Normal rate.     Pulses: Normal pulses.  Musculoskeletal:     Comments: Right shoulder full range of motion  Left shoulder external rotation at 0 degrees was normal internal rotation was normal to approximately T7 abduction was up to 90 and forward elevation to 120.  She did have 4 out of 5 weakness in the empty can  position.  No instability in abduction external rotation  Impingement sign positive at 120 degrees of forward elevation    Skin:    General: Skin is warm and dry.     Capillary Refill: Capillary refill takes less than 2 seconds.  Neurological:     General: No focal deficit present.     Mental Status: She is alert and oriented to person, place, and time.  Psychiatric:        Mood and Affect: Mood normal.        Behavior: Behavior normal.        Thought Content: Thought content normal.        Judgment: Judgment normal.    Outside imaging was performed.  The image was read as moderate arthritis.  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  Although this was not a Gregoria Leas view it appears that she may have some elevation of the humeral head in the humeral head acromial distance may be diminished somewhat  No bone lesions and the humeral head was intact with no signs of AVN

## 2023-09-30 NOTE — Progress Notes (Signed)
  Intake history:  Ht 5\' 7"  (1.702 m)   Wt 119 lb (54 kg)   LMP 10/27/2010   BMI 18.64 kg/m  Body mass index is 18.64 kg/m.    WHAT ARE WE SEEING YOU FOR TODAY?   left shoulder  How long has this bothered you? (DOI?DOS?WS?)  approximately 3 month(s) ago  Anticoag.  No  Diabetes No  Heart disease No  Hypertension No  SMOKING HX No  Kidney disease No  Any ALLERGIES ______________________________________________   Treatment:  Have you taken:  Tylenol  Yes  Advil  No  Had PT Yes  Had injection No  Other  Pain patches_____________________

## 2023-10-03 ENCOUNTER — Other Ambulatory Visit: Payer: Self-pay | Admitting: Family Medicine

## 2023-10-09 IMAGING — DX DG KNEE COMPLETE 4+V*L*
4 series · 4 of 4 positions shown · non-contrast
Comparison: None Available.

CLINICAL DATA: Chronic left hip and knee pain.

EXAM:
LEFT KNEE - COMPLETE 4+ VIEW

[knee ap]
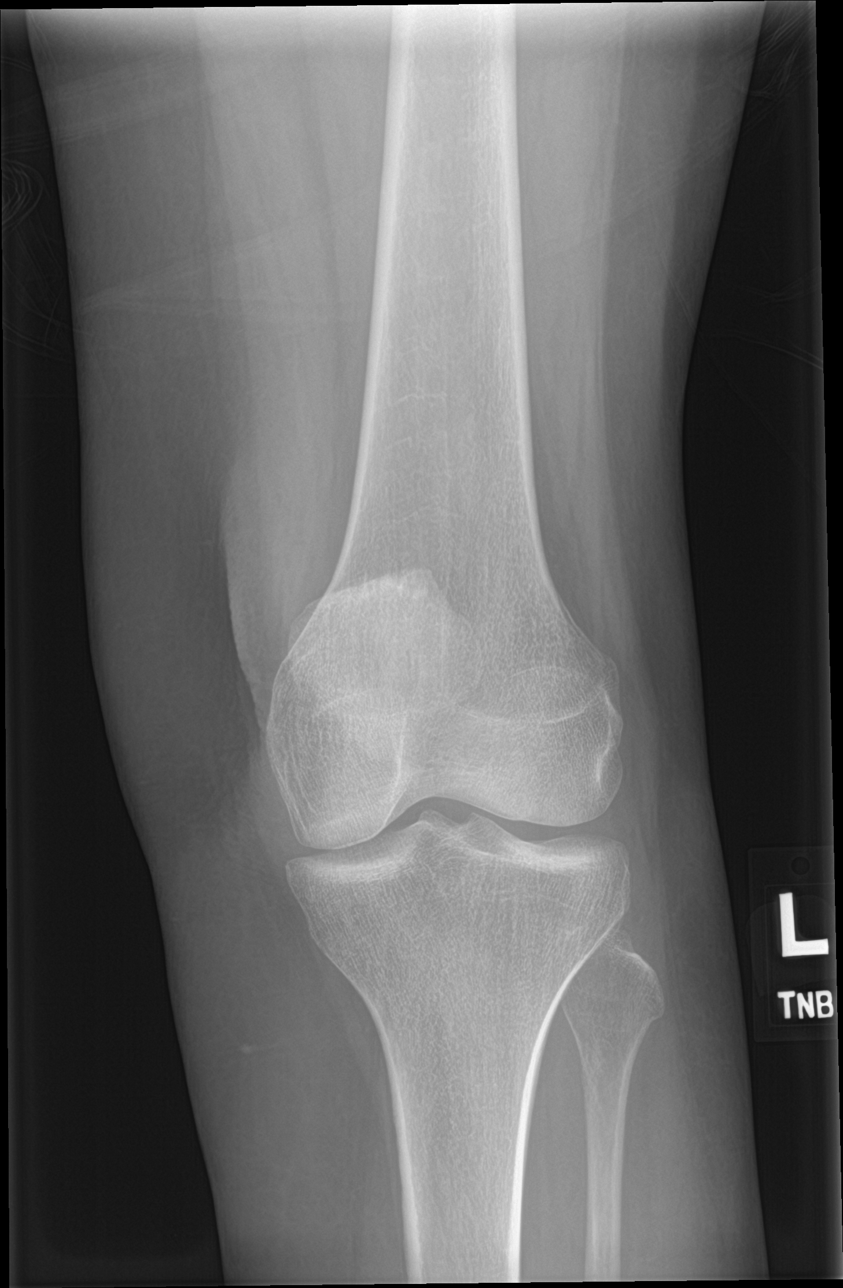

[knee obl (1 of 2)]
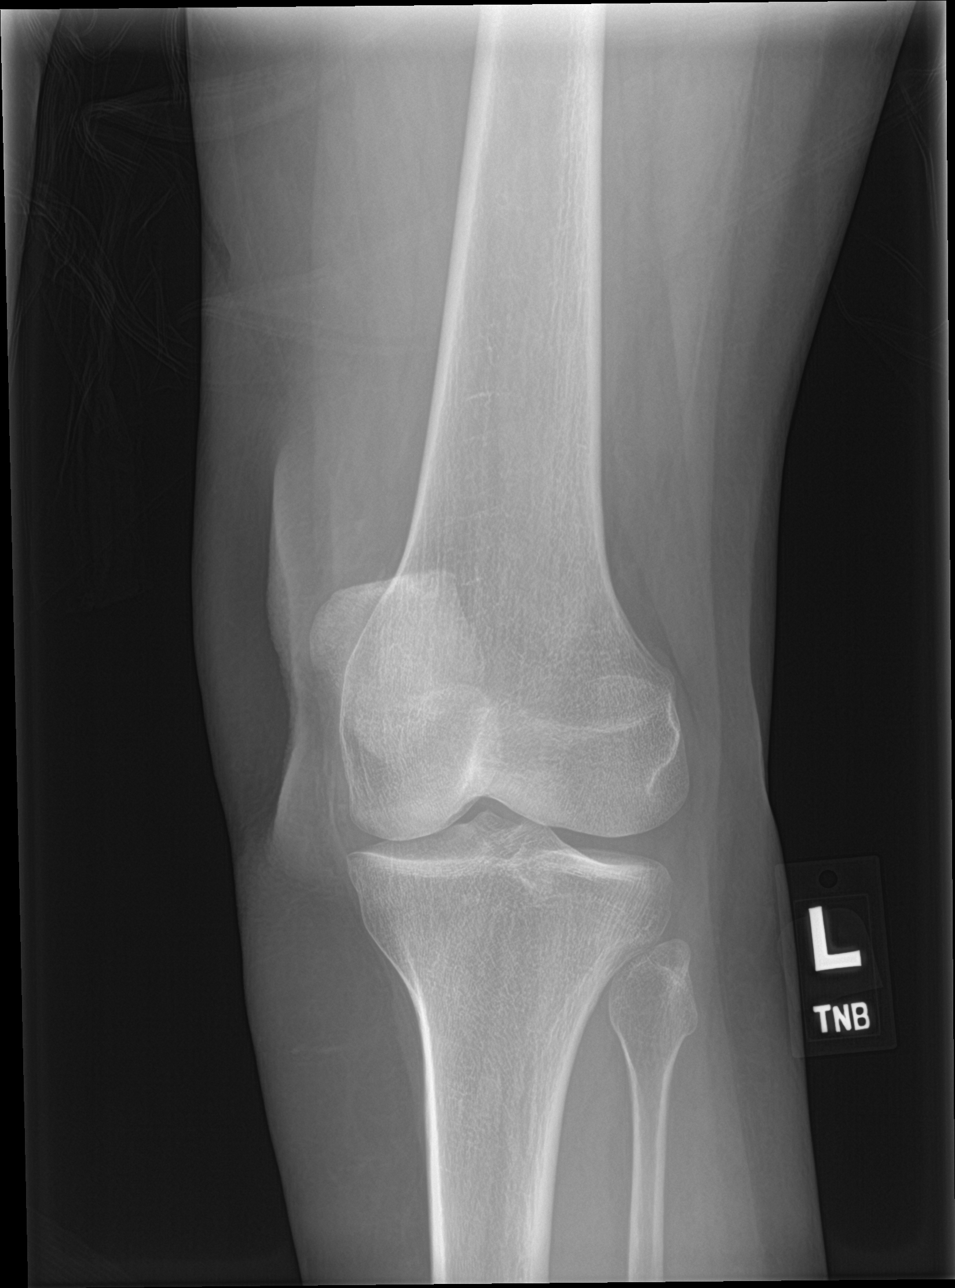

[knee obl (2 of 2)]
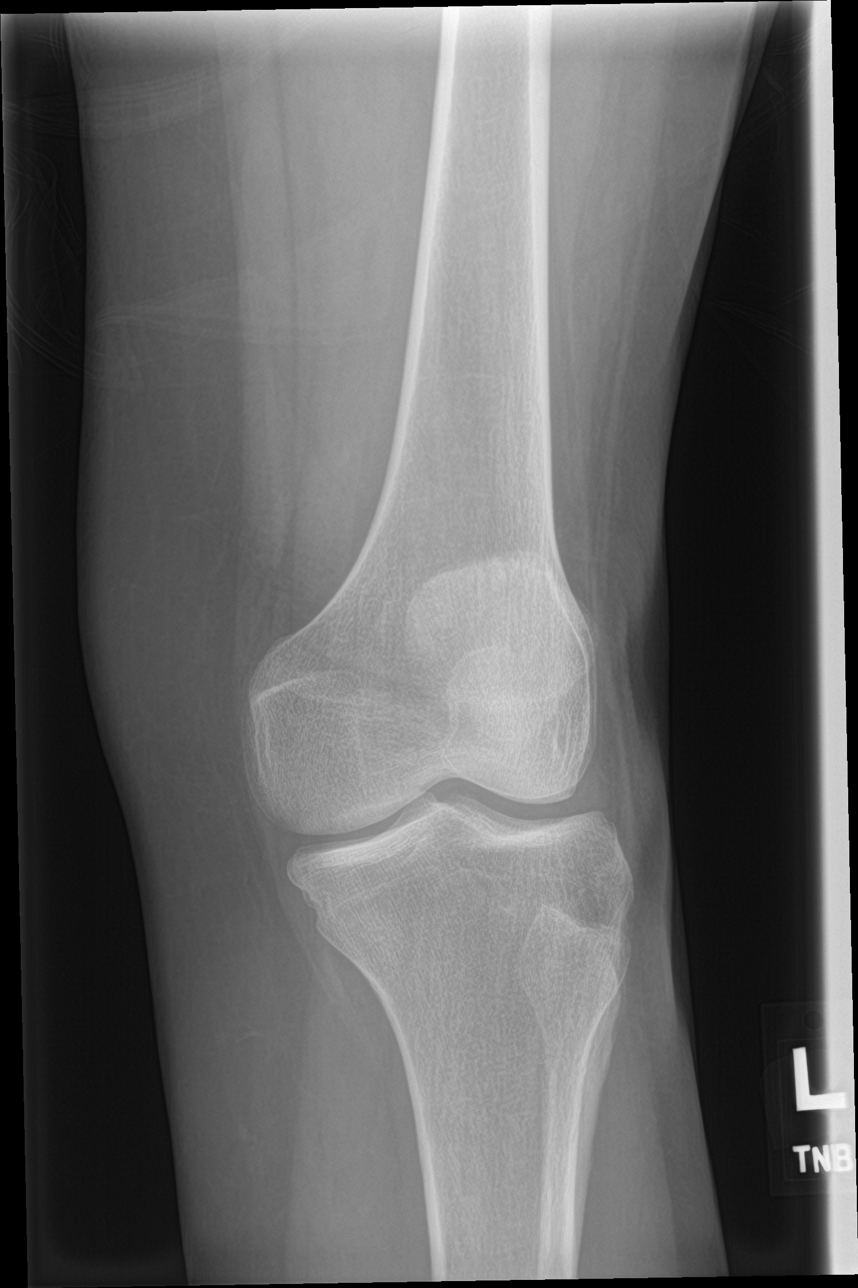

[knee lat]
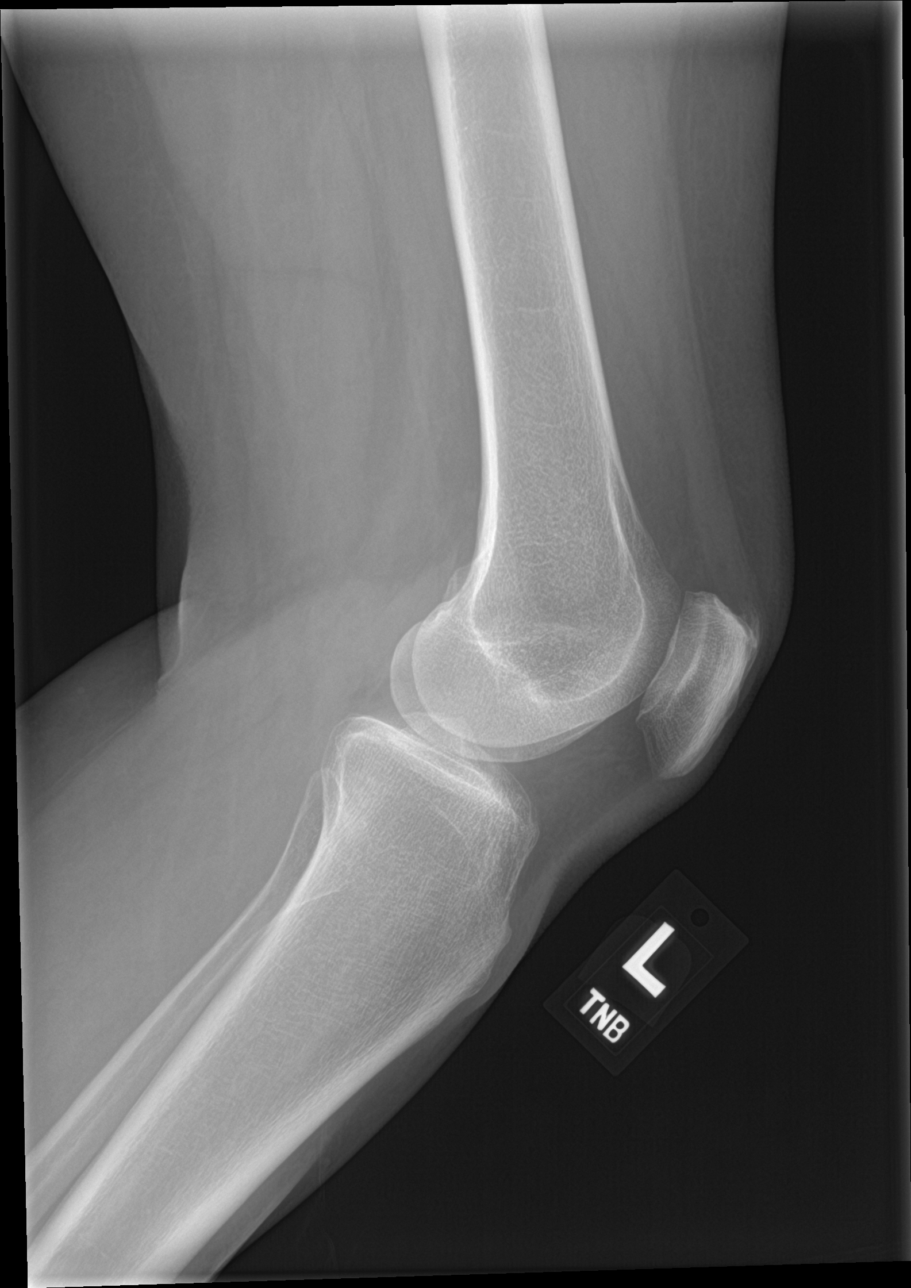

[4 of 4 positions shown; findings below may reference images not displayed]

FINDINGS: Mild medial compartment joint space narrowing. Mild chronic
enthesopathic change at the quadriceps insertion on the patella. No
joint effusion. No acute fracture or dislocation.
IMPRESSION: 1. Mild medial compartment joint space narrowing.
2. Mild enthesophyte at the quadriceps tendon insertion.

## 2023-10-21 ENCOUNTER — Encounter (HOSPITAL_COMMUNITY): Admitting: Occupational Therapy

## 2023-10-25 ENCOUNTER — Other Ambulatory Visit: Payer: Self-pay | Admitting: Family Medicine

## 2023-11-02 ENCOUNTER — Encounter (HOSPITAL_COMMUNITY): Admitting: Occupational Therapy

## 2023-11-25 ENCOUNTER — Ambulatory Visit: Admitting: Orthopedic Surgery

## 2023-12-04 ENCOUNTER — Other Ambulatory Visit: Payer: Self-pay | Admitting: Family Medicine

## 2023-12-06 ENCOUNTER — Other Ambulatory Visit: Payer: Self-pay | Admitting: Family Medicine

## 2023-12-06 ENCOUNTER — Other Ambulatory Visit: Payer: Self-pay

## 2023-12-06 MED ORDER — FAMOTIDINE 20 MG PO TABS: 20.0000 mg | ORAL_TABLET | Freq: Every day | ORAL | 1 refills | Status: DC

## 2023-12-06 MED ORDER — DEXLANSOPRAZOLE 60 MG PO CPDR: 60.0000 mg | DELAYED_RELEASE_CAPSULE | Freq: Every day | ORAL | 3 refills | Status: DC

## 2023-12-06 NOTE — Telephone Encounter (Signed)
 FYI Only or Action Required?: Action required by provider: medication refill request.  Patient was last seen in primary care on 08/30/2023 by Cook, Jayce G, DO.  Called Nurse Triage reporting No chief complaint on file..  Symptoms began today.  Interventions attempted: Nothing.  Symptoms are: stable.  Triage Disposition: No disposition on file.  Patient/caregiver understands and will follow disposition?:

## 2023-12-06 NOTE — Telephone Encounter (Signed)
 Copied from CRM [id number]. Topic: Clinical - Medication Refill >> Dec 06, 2023  8:57 AM Avram MATSU wrote: Medication: dexlansoprazole  (DEXILANT ) 60 MG capsule [[id number]]  Has the patient contacted their pharmacy? Yes (Agent: If no, request that the patient contact the pharmacy for the refill. If patient does not wish to contact the pharmacy document the reason why and proceed with request.) (Agent: If yes, when and what did the pharmacy advise?)  This is the patient's preferred pharmacy:  [facility] [phone] - [place], [place] - [address] AT [facility] [address] [place] [address] Phone: [phone] Fax: [phone]  Is this the correct pharmacy for this prescription? Yes If no, delete pharmacy and type the correct one.   Has the prescription been filled recently? No  Is the patient out of the medication? Yes  Has the patient been seen for an appointment in the last year OR does the patient have an upcoming appointment? Yes  Can we respond through MyChart? Yes  Agent: Please be advised that Rx refills may take up to 3 business days. We ask that you follow-up with your pharmacy.

## 2023-12-06 NOTE — Telephone Encounter (Signed)
 Copied from CRM [id number]. Topic: Clinical - Medication Refill >> Dec 06, 2023  8:59 AM Avram MATSU wrote: Medication: famotidine  (PEPCID ) 20 MG tablet [[id number]]  Has the patient contacted their pharmacy? Yes (Agent: If no, request that the patient contact the pharmacy for the refill. If patient does not wish to contact the pharmacy document the reason why and proceed with request.) (Agent: If yes, when and what did the pharmacy advise?)  This is the patient's preferred pharmacy:  [facility] [phone] - [place], [place] - [address] AT [facility] [address] [place] [address] Phone: [phone] Fax: [phone]  Is this the correct pharmacy for this prescription? Yes If no, delete pharmacy and type the correct one.   Has the prescription been filled recently? No  Is the patient out of the medication? Yes  Has the patient been seen for an appointment in the last year OR does the patient have an upcoming appointment? Yes  Can we respond through MyChart? Yes  Agent: Please be advised that Rx refills may take up to 3 business days. We ask that you follow-up with your pharmacy.

## 2023-12-08 ENCOUNTER — Other Ambulatory Visit: Payer: Self-pay | Admitting: Family Medicine

## 2023-12-10 ENCOUNTER — Other Ambulatory Visit: Payer: Self-pay

## 2023-12-10 MED ORDER — FAMOTIDINE 20 MG PO TABS: 20.0000 mg | ORAL_TABLET | Freq: Every day | ORAL | 1 refills | Status: AC

## 2023-12-13 ENCOUNTER — Ambulatory Visit: Payer: Self-pay

## 2023-12-13 NOTE — Telephone Encounter (Signed)
 FYI Only or Action Required?: FYI only for provider.  Patient was last seen in primary care on 08/30/2023 by Cook, Jayce G, DO.  Called Nurse Triage reporting Shoulder Pain.  Symptoms began several months ago.  Interventions attempted: OTC medications: tylenol  .  Symptoms are: gradually worsening.  Triage Disposition: See PCP When Office is Open (Within 3 Days)  Patient/caregiver understands and will follow disposition?: Yes      Copied from CRM [id number]. Topic: Clinical - Red Word Triage >> Dec 13, 2023  3:27 PM Corin V wrote: [facility] that prompted transfer to Nurse Triage: Patient called in asking about having another shot in her left arm due to pain being so bad she can't lift the arm. Pain is in the shoulder and is 9/10. Reason for Disposition  [1] MODERATE pain (e.g., interferes with normal activities) AND [2] present > 3 days  Answer Assessment - Initial Assessment Questions 1. ONSET: When did the pain start?     A few months ago 2. LOCATION: Where is the pain located?     Left shoulder 3. PAIN: How bad is the pain? (Scale 1-10; or mild, moderate, severe)     9/10 took tylenol  this am around 10  4. WORK OR EXERCISE: Has there been any recent work or exercise that involved this part of the body?     no 5. CAUSE: What do you think is causing the shoulder pain?     Clemens a couple weeks ago  6. OTHER SYMPTOMS: Do you have any other symptoms? (e.g., neck pain, swelling, rash, fever, numbness, weakness)     none  Protocols used: Shoulder Pain-A-AH

## 2023-12-14 ENCOUNTER — Ambulatory Visit (INDEPENDENT_AMBULATORY_CARE_PROVIDER_SITE_OTHER): Admitting: Family Medicine

## 2023-12-14 VITALS — BP 112/72 | HR 85 | Temp 98.6°F | Ht 67.0 in | Wt 112.6 lb

## 2023-12-14 DIAGNOSIS — Z780 Asymptomatic menopausal state: Secondary | ICD-10-CM

## 2023-12-14 DIAGNOSIS — G8929 Other chronic pain: Secondary | ICD-10-CM | POA: Diagnosis not present

## 2023-12-14 DIAGNOSIS — M25512 Pain in left shoulder: Secondary | ICD-10-CM

## 2023-12-14 NOTE — Assessment & Plan Note (Signed)
 Persistent. MRI for further evaluation.

## 2023-12-14 NOTE — Patient Instructions (Signed)
 Arranging MRI.  Call [phone] to schedule Bone density.

## 2023-12-14 NOTE — Progress Notes (Signed)
 Subjective:  Patient ID: Shannon Castaneda, female    DOB: 15-Jul-1965  Age: 58 y.o. MRN: [id number]  CC:  Left shoulder pain   HPI:  58 year old female presents with left shoulder pain.  Chronic. Continues to persist. She reports that the pain is constant. Has seen Ortho and got brief relief with injection. Ortho recommend MRI to assess rotator cuff if pain continued to persist.  Patient also wanting to get a bone density. She is underweight and is postmenopausal making her at higher risk for osteoporosis.  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   Chronic idiopathic constipation 02/02/2023   Restless legs 02/12/2021   Gastroesophageal reflux disease 04/28/2019   Vitamin D  deficiency 04/28/2019   Degeneration of lumbar or lumbosacral intervertebral disc 03/17/2016   Chronic back pain 03/17/2016   HLD (hyperlipidemia) 03/17/2016    Social Hx   Social History   Socioeconomic History   Marital status: Single    Spouse name: Not on file   Number of children: 1   Years of education: 12   Highest education level: Not on file  Occupational History    Comment: disability  Tobacco Use   Smoking status: Never    Passive exposure: Never   Smokeless tobacco: Never  Vaping Use   Vaping status: Never Used  Substance and Sexual Activity   Alcohol use: No   Drug use: No   Sexual activity: Yes    Birth control/protection: Pill  Other Topics Concern   Not on file  Social History Narrative   Lives with daughter age 60, granddaughter   Disabled due to slow learner      Lives alone    Social Drivers of Health   Financial Resource Strain: Low Risk  (07/16/2023)   Overall Financial Resource Strain (CARDIA)    Difficulty of Paying Living Expenses: Not hard at  all  Food Insecurity: No Food Insecurity (07/16/2023)   Hunger Vital Sign    Worried About Running Out of Food in the Last Year: Never true    Ran Out of Food in the Last Year: Never true  Transportation Needs: No Transportation Needs (07/16/2023)   PRAPARE - Administrator, Civil Service (Medical): No    Lack of Transportation (Non-Medical): No  Physical Activity: Insufficiently Active (07/16/2023)   Exercise Vital Sign    Days of Exercise per Week: 7 days    Minutes of Exercise per Session: 20 min  Stress: No Stress Concern Present (07/16/2023)   [employer] of Occupational Health - Occupational Stress Questionnaire    Feeling of Stress : Only a little  Social Connections: Unknown (07/16/2023)   Social Connection and Isolation Panel    Frequency of Communication with Friends and Family: More than three times a week    Frequency of Social Gatherings with Friends and Family: Once a week    Attends Religious Services: Never    Database administrator or Organizations: No    Attends Banker Meetings: Never    Marital Status: Patient declined    Review of Systems Per HPI  Objective:  BP 112/72   Pulse 85   Temp 98.6 F (37 C)   Ht 5' 7 (1.702 m)   Wt 112 lb 9.6 oz (  51.1 kg)   LMP 10/27/2010   SpO2 97%   BMI 17.64 kg/m      12/14/2023    3:44 PM 09/30/2023    3:21 PM 08/30/2023   10:31 AM  BP/Weight  Systolic BP 112 111 119  Diastolic BP 72 67 74  Wt. (Lbs) 112.6 119 120  BMI 17.64 kg/m2 18.64 kg/m2 18.79 kg/m2    Physical Exam Constitutional:      General: She is not in acute distress.    Appearance: Normal appearance.  HENT:     Head: Normocephalic and atraumatic.  Pulmonary:     Effort: Pulmonary effort is normal. No respiratory distress.  Musculoskeletal:     Comments: No tenderness of the left shoulder.  Neurological:     Mental Status: She is alert.  Psychiatric:     Comments: Flat affect.     Lab Results  Component Value  Date   WBC 9.1 08/17/2023   HGB 12.2 08/17/2023   HCT 37.9 08/17/2023   PLT 227 08/17/2023   GLUCOSE 101 (H) 08/17/2023   CHOL 170 08/17/2023   TRIG 106 08/17/2023   HDL 79 08/17/2023   LDLCALC 72 08/17/2023   ALT 18 08/17/2023   AST 25 08/17/2023   NA 139 08/17/2023   K 4.6 08/17/2023   CL 102 08/17/2023   CREATININE 0.84 08/17/2023   BUN 9 08/17/2023   CO2 23 08/17/2023   TSH 1.850 08/17/2023   INR 1.00 07/23/2011   HGBA1C 5.9 (H) 08/17/2023     Assessment & Plan:  Chronic left shoulder pain Assessment & Plan: Persistent. MRI for further evaluation.  Orders: -     MR SHOULDER LEFT WO CONTRAST  Post-menopausal -     DG Bone Density    Follow-up:  Pending MRI  Jacqulyn Ahle DO [facility]

## 2023-12-17 ENCOUNTER — Other Ambulatory Visit (HOSPITAL_COMMUNITY)

## 2023-12-18 ENCOUNTER — Ambulatory Visit (HOSPITAL_COMMUNITY)
Admission: RE | Admit: 2023-12-18 | Discharge: 2023-12-18 | Disposition: A | Discharge status: Home / Self Care | Source: Ambulatory Visit | Attending: Family Medicine | Admitting: Family Medicine

## 2023-12-18 DIAGNOSIS — G8929 Other chronic pain: Secondary | ICD-10-CM | POA: Diagnosis not present

## 2023-12-18 DIAGNOSIS — M25512 Pain in left shoulder: Secondary | ICD-10-CM | POA: Insufficient documentation

## 2023-12-18 DIAGNOSIS — M7582 Other shoulder lesions, left shoulder: Secondary | ICD-10-CM | POA: Diagnosis not present

## 2023-12-18 DIAGNOSIS — M129 Arthropathy, unspecified: Secondary | ICD-10-CM | POA: Diagnosis not present

## 2023-12-18 DIAGNOSIS — S46812A Strain of other muscles, fascia and tendons at shoulder and upper arm level, left arm, initial encounter: Secondary | ICD-10-CM | POA: Diagnosis not present

## 2023-12-19 ENCOUNTER — Ambulatory Visit: Payer: Self-pay | Admitting: Family Medicine

## 2023-12-21 ENCOUNTER — Encounter: Payer: Self-pay | Admitting: Family Medicine

## 2023-12-22 ENCOUNTER — Telehealth: Payer: Self-pay | Admitting: *Deleted

## 2023-12-22 NOTE — Telephone Encounter (Signed)
Patient notified of provider's recommendations 

## 2023-12-22 NOTE — Telephone Encounter (Signed)
 Cook, Jayce G, DO      12/22/23  1:58 PM I have to have a diagnosis that warrants/explains the needed (anxiety, depression, etc).

## 2023-12-22 NOTE — Telephone Encounter (Signed)
 Copied from CRM #[id number]. Topic: General - Other >> Dec 22, 2023  1:00 PM Sasha H wrote: Reason for CRM: Pt is wanting to know why her provider won't write her paper for an emotional support animal

## 2023-12-31 ENCOUNTER — Other Ambulatory Visit: Payer: Self-pay

## 2023-12-31 DIAGNOSIS — G8929 Other chronic pain: Secondary | ICD-10-CM

## 2024-01-03 ENCOUNTER — Other Ambulatory Visit (HOSPITAL_COMMUNITY)

## 2024-01-07 ENCOUNTER — Ambulatory Visit (HOSPITAL_COMMUNITY)
Admission: RE | Admit: 2024-01-07 | Discharge: 2024-01-07 | Disposition: A | Discharge status: Home / Self Care | Source: Ambulatory Visit | Attending: Family Medicine | Admitting: Family Medicine

## 2024-01-07 DIAGNOSIS — Z780 Asymptomatic menopausal state: Secondary | ICD-10-CM | POA: Diagnosis not present

## 2024-01-07 DIAGNOSIS — M8589 Other specified disorders of bone density and structure, multiple sites: Secondary | ICD-10-CM | POA: Diagnosis not present

## 2024-01-11 ENCOUNTER — Telehealth: Payer: Self-pay

## 2024-01-11 NOTE — Telephone Encounter (Signed)
 Pt.notified

## 2024-01-11 NOTE — Telephone Encounter (Signed)
 Spoke to patient about Dexa scan results. She is already taking a multivitamin with calcium  and vit D. She mentioned that at a past visit with Elveria it was mentioned about her starting a shot based on results. She questions as to if she needs this.

## 2024-02-03 ENCOUNTER — Telehealth: Payer: Self-pay | Admitting: Family Medicine

## 2024-02-03 NOTE — Telephone Encounter (Signed)
 Copied from CRM #[id number]. Topic: Medical Record Request - Records Request >> Feb 03, 2024  1:23 PM Tiffini S wrote: Reason for CRM: Patient have a appointment tomorrow and requested to have medical records last week in [facility] to be sent to [facility] in [place]/ patient don't have a fax number Please call the patient back at [phone].

## 2024-02-03 NOTE — Telephone Encounter (Signed)
 Will send records to [facility] when fax is back up.

## 2024-02-04 DIAGNOSIS — Z1231 Encounter for screening mammogram for malignant neoplasm of breast: Secondary | ICD-10-CM | POA: Diagnosis not present

## 2024-02-04 DIAGNOSIS — M8589 Other specified disorders of bone density and structure, multiple sites: Secondary | ICD-10-CM | POA: Diagnosis not present

## 2024-02-04 DIAGNOSIS — K2100 Gastro-esophageal reflux disease with esophagitis, without bleeding: Secondary | ICD-10-CM | POA: Diagnosis not present

## 2024-02-04 DIAGNOSIS — M19012 Primary osteoarthritis, left shoulder: Secondary | ICD-10-CM | POA: Diagnosis not present

## 2024-02-04 DIAGNOSIS — Z1159 Encounter for screening for other viral diseases: Secondary | ICD-10-CM | POA: Diagnosis not present

## 2024-02-04 DIAGNOSIS — M67912 Unspecified disorder of synovium and tendon, left shoulder: Secondary | ICD-10-CM | POA: Diagnosis not present

## 2024-02-04 DIAGNOSIS — K440 Diaphragmatic hernia with obstruction, without gangrene: Secondary | ICD-10-CM | POA: Diagnosis not present

## 2024-02-04 DIAGNOSIS — K2930 Chronic superficial gastritis without bleeding: Secondary | ICD-10-CM | POA: Diagnosis not present

## 2024-02-04 DIAGNOSIS — K5904 Chronic idiopathic constipation: Secondary | ICD-10-CM | POA: Diagnosis not present

## 2024-02-09 ENCOUNTER — Other Ambulatory Visit: Payer: Self-pay | Admitting: Family Medicine

## 2024-02-09 DIAGNOSIS — G8929 Other chronic pain: Secondary | ICD-10-CM

## 2024-02-22 DIAGNOSIS — Z1231 Encounter for screening mammogram for malignant neoplasm of breast: Secondary | ICD-10-CM | POA: Diagnosis not present

## 2024-03-01 ENCOUNTER — Ambulatory Visit: Admitting: Orthopedic Surgery

## 2024-04-19 ENCOUNTER — Ambulatory Visit (INDEPENDENT_AMBULATORY_CARE_PROVIDER_SITE_OTHER): Admitting: Orthopedic Surgery

## 2024-04-19 ENCOUNTER — Encounter: Payer: Self-pay | Admitting: Orthopedic Surgery

## 2024-04-19 DIAGNOSIS — M75112 Incomplete rotator cuff tear or rupture of left shoulder, not specified as traumatic: Secondary | ICD-10-CM | POA: Diagnosis not present

## 2024-04-19 DIAGNOSIS — G8929 Other chronic pain: Secondary | ICD-10-CM | POA: Diagnosis not present

## 2024-04-19 DIAGNOSIS — M25512 Pain in left shoulder: Secondary | ICD-10-CM

## 2024-04-19 MED ORDER — METHYLPREDNISOLONE ACETATE 40 MG/ML IJ SUSP
40.0000 mg | Freq: Once | INTRAMUSCULAR | Status: AC
  Administered 2024-04-19: 40 mg

## 2024-04-19 MED ORDER — METHYLPREDNISOLONE ACETATE 40 MG/ML IJ SUSP: 40.0000 mg | Freq: Once | INTRAMUSCULAR | Status: AC

## 2024-04-19 NOTE — Progress Notes (Signed)
    04/19/2024   Chief Complaint  Patient presents with   Shoulder Pain    Left- request injection     No diagnosis found.  What pharmacy do you use ? _________[facility] [address]  DOI/DOS/ Date:    Did you get better, worse or no change (Answer below)   Worse

## 2024-04-19 NOTE — Addendum Note (Signed)
 Addended by: DARRA MILLING R on: 04/19/2024 08:59 AM   Modules accepted: Orders

## 2024-04-19 NOTE — Patient Instructions (Signed)
 You have received an injection of steroids into the joint. 15% of patients will have increased pain within the 24 hours postinjection.   This is transient and will go away.   We recommend that you use ice packs on the injection site for 20 minutes every 2 hours and extra strength Tylenol 2 tablets every 8 as needed until the pain resolves.  If you continue to have pain after taking the Tylenol and using the ice please call the office for further instructions.

## 2024-04-19 NOTE — Progress Notes (Signed)
   Chief Complaint  Patient presents with   Shoulder Pain    Left- request injection     58 year old female presents with chronic left shoulder pain last seen in May of this year.  She eventually went back to her primary care who ordered an MRI of her shoulder  FINDINGS: Rotator cuff: Moderate supraspinatus tendinosis with a partial-thickness bursal surface tear measuring 8 mm medial-lateral, 8 mm AP with a tiny component encompassing greater than 90% of the tendon thickness. Moderate infraspinatus tendinosis. Teres minor tendon is intact. Mild subscapularis tendinosis.  When I saw her in May we did a subacromial injection and recommended physical therapy but she did not go  Reevaluation required prior to repeat injection   She presents back for reevaluation and would like a left shoulder injection  Shoulder Pain     PHYSICAL EXAM:  Left shoulder exam focused exam  No swelling or tenderness around the shoulder skin is intact no erythema  Tenderness in the rotator interval and anterolateral acromial soft tissue deltoid  Decreased range of motion with 90 degrees abduction 100 degrees of flexion active passive range of motion normal  No instability in abduction external rotation  Abduction and supraspinatus weakness 3-4 out of 5  Sensory exam normal  Vascular exam normal pulse and perfusion and color   Assessment and Plan:  MRI August 2025  The MRI scan is read independently.  She has a small bursal sided rotator cuff tear supraspinatus tendon left shoulder  MRI reported as follows FINDINGS: Rotator cuff: Moderate supraspinatus tendinosis with a partial-thickness bursal surface tear measuring 8 mm medial-lateral, 8 mm AP with a tiny component encompassing greater than 90% of the tendon thickness. Moderate infraspinatus tendinosis. Teres minor tendon is intact. Mild subscapularis tendinosis.   Strongly encouraged the patient to go to physical therapy and it is  okay to get an injection on this occasion.  She was reluctant but eventually agreed to go to therapy.  She was advised that this can progress to a full-thickness tear and require surgery but with good physical therapy and activity modification she may be a able to avoid operation  Return as needed  Procedure note the subacromial injection shoulder left   Verbal consent was obtained to inject the  Left   Shoulder  Timeout was completed to confirm the injection site is a subacromial space of the  left  shoulder  Medication used Depo-Medrol  40 mg and lidocaine  1% 3 cc  Anesthesia was provided by ethyl chloride  The injection was performed in the left  posterior subacromial space. After pinning the skin with alcohol and anesthetized the skin with ethyl chloride the subacromial space was injected using a 20-gauge needle. There were no complications  Sterile dressing was applied.

## 2024-04-21 ENCOUNTER — Ambulatory Visit (HOSPITAL_COMMUNITY): Admitting: Occupational Therapy

## 2024-05-17 ENCOUNTER — Ambulatory Visit (HOSPITAL_COMMUNITY): Admitting: Occupational Therapy

## 2024-05-30 ENCOUNTER — Ambulatory Visit (HOSPITAL_COMMUNITY): Admitting: Occupational Therapy

## 2024-06-20 ENCOUNTER — Other Ambulatory Visit: Payer: Self-pay | Admitting: Family Medicine

## 2024-06-30 ENCOUNTER — Ambulatory Visit (HOSPITAL_COMMUNITY): Admitting: Occupational Therapy

## 2024-07-21 ENCOUNTER — Ambulatory Visit: Payer: 59
